# Patient Record
Sex: Female | Born: 1985 | Race: White | Hispanic: No | Marital: Married | Smoking: Never smoker
Health system: Southern US, Community
[De-identification: ages and names within clinical notes are randomized; demographics above are authoritative.]

## PROBLEM LIST (undated history)

## (undated) DIAGNOSIS — N1330 Unspecified hydronephrosis: Secondary | ICD-10-CM

## (undated) DIAGNOSIS — O2240 Hemorrhoids in pregnancy, unspecified trimester: Secondary | ICD-10-CM

## (undated) DIAGNOSIS — J309 Allergic rhinitis, unspecified: Secondary | ICD-10-CM

## (undated) DIAGNOSIS — T753XXA Motion sickness, initial encounter: Secondary | ICD-10-CM

## (undated) DIAGNOSIS — Z87442 Personal history of urinary calculi: Secondary | ICD-10-CM

## (undated) DIAGNOSIS — N390 Urinary tract infection, site not specified: Secondary | ICD-10-CM

## (undated) HISTORY — DX: Hemorrhoids in pregnancy, unspecified trimester: O22.40

## (undated) HISTORY — DX: Urinary tract infection, site not specified: N39.0

## (undated) HISTORY — DX: Unspecified hydronephrosis: N13.30

## (undated) HISTORY — DX: Personal history of urinary calculi: Z87.442

## (undated) HISTORY — DX: Allergic rhinitis, unspecified: J30.9

## (undated) HISTORY — DX: Motion sickness, initial encounter: T75.3XXA

---

## 2005-10-04 ENCOUNTER — Emergency Department: Payer: Self-pay | Admitting: Emergency Medicine

## 2008-12-22 ENCOUNTER — Ambulatory Visit: Payer: Self-pay | Admitting: Internal Medicine

## 2009-07-26 ENCOUNTER — Other Ambulatory Visit: Payer: Self-pay | Admitting: Internal Medicine

## 2010-05-09 ENCOUNTER — Other Ambulatory Visit: Payer: Self-pay | Admitting: Internal Medicine

## 2010-11-20 ENCOUNTER — Emergency Department: Payer: Self-pay | Admitting: Emergency Medicine

## 2010-11-30 ENCOUNTER — Encounter: Payer: Self-pay | Admitting: Maternal & Fetal Medicine

## 2011-03-05 ENCOUNTER — Encounter: Payer: Self-pay | Admitting: Obstetrics and Gynecology

## 2011-03-16 ENCOUNTER — Ambulatory Visit: Payer: Self-pay | Admitting: Obstetrics and Gynecology

## 2011-03-30 ENCOUNTER — Other Ambulatory Visit: Payer: Self-pay | Admitting: Obstetrics and Gynecology

## 2011-04-06 ENCOUNTER — Other Ambulatory Visit: Payer: Self-pay | Admitting: Family Medicine

## 2011-04-20 ENCOUNTER — Ambulatory Visit: Payer: Self-pay | Admitting: Family Medicine

## 2011-06-18 ENCOUNTER — Inpatient Hospital Stay: Payer: Self-pay | Admitting: Obstetrics and Gynecology

## 2011-11-16 ENCOUNTER — Encounter: Payer: Self-pay | Admitting: Internal Medicine

## 2011-11-16 ENCOUNTER — Ambulatory Visit (INDEPENDENT_AMBULATORY_CARE_PROVIDER_SITE_OTHER): Payer: PRIVATE HEALTH INSURANCE | Admitting: Internal Medicine

## 2011-11-16 VITALS — BP 92/60 | HR 76 | Temp 98.6°F | Resp 16 | Ht 62.0 in | Wt 116.8 lb

## 2011-11-16 DIAGNOSIS — N390 Urinary tract infection, site not specified: Secondary | ICD-10-CM

## 2011-11-16 DIAGNOSIS — Z30018 Encounter for initial prescription of other contraceptives: Secondary | ICD-10-CM

## 2011-11-16 DIAGNOSIS — R634 Abnormal weight loss: Secondary | ICD-10-CM

## 2011-11-16 DIAGNOSIS — Z124 Encounter for screening for malignant neoplasm of cervix: Secondary | ICD-10-CM

## 2011-11-16 DIAGNOSIS — Z87442 Personal history of urinary calculi: Secondary | ICD-10-CM | POA: Insufficient documentation

## 2011-11-16 DIAGNOSIS — Z3002 Counseling and instruction in natural family planning to avoid pregnancy: Secondary | ICD-10-CM

## 2011-11-16 LAB — POCT URINALYSIS DIPSTICK
Blood, UA: NEGATIVE
Glucose, UA: NEGATIVE
Nitrite, UA: NEGATIVE
Protein, UA: NEGATIVE
Urobilinogen, UA: 0.2

## 2011-11-16 MED ORDER — NORETHINDRONE 0.35 MG PO TABS: 1.0000 | ORAL_TABLET | Freq: Every day | ORAL | Status: DC

## 2011-11-18 ENCOUNTER — Encounter: Payer: Self-pay | Admitting: Internal Medicine

## 2011-11-18 DIAGNOSIS — Z124 Encounter for screening for malignant neoplasm of cervix: Secondary | ICD-10-CM | POA: Insufficient documentation

## 2011-11-18 LAB — CULTURE, URINE COMPREHENSIVE
Colony Count: NO GROWTH
Organism ID, Bacteria: NO GROWTH

## 2011-11-18 NOTE — Progress Notes (Signed)
  Subjective:    Patient ID: Brandi Johnson, female    DOB: 10/12/1986, 25 y.o.   MRN: [id number]  HPI  25 yo white female with no significant PMH returns for checkup after delivering a healthy female child approximately 5ymonths ago by C section.  She has actually lost weight and compare to her prenatal weight.  She is using a breast pump to nurse.   And has been back to work for a few weeks,  Often skipping lunch in order to pump. Sleep has improved now that her son is sleeping through the night.   Past Medical History  Diagnosis Date  . History of renal calculi     with hydroneprosis noted during pregnancy   No current outpatient prescriptions on file prior to visit.    Review of Systems  Constitutional: Negative for fever, chills and unexpected weight change.  HENT: Negative for hearing loss, ear pain, nosebleeds, congestion, sore throat, facial swelling, rhinorrhea, sneezing, mouth sores, trouble swallowing, neck pain, neck stiffness, voice change, postnasal drip, sinus pressure, tinnitus and ear discharge.   Eyes: Negative for pain, discharge, redness and visual disturbance.  Respiratory: Negative for cough, chest tightness, shortness of breath, wheezing and stridor.   Cardiovascular: Negative for chest pain, palpitations and leg swelling.  Musculoskeletal: Negative for myalgias and arthralgias.  Skin: Negative for color change and rash.  Neurological: Negative for dizziness, weakness, light-headedness and headaches.  Hematological: Negative for adenopathy.       Objective:   Physical Exam  Constitutional: She is oriented to person, place, and time. She appears well-developed and well-nourished.  HENT:  Mouth/Throat: Oropharynx is clear and moist.  Eyes: EOM are normal. Pupils are equal, round, and reactive to light. No scleral icterus.  Neck: Normal range of motion. Neck supple. No JVD present. No thyromegaly present.  Cardiovascular: Normal rate, regular rhythm, normal heart  sounds and intact distal pulses.   Pulmonary/Chest: Effort normal and breath sounds normal.  Abdominal: Soft. Bowel sounds are normal. She exhibits no mass. There is no tenderness.  Musculoskeletal: Normal range of motion. She exhibits no edema.  Lymphadenopathy:    She has no cervical adenopathy.  Neurological: She is alert and oriented to person, place, and time.  Skin: Skin is warm and dry.  Psychiatric: She has a normal mood and affect.      Assessment & Plan:  Weight loss:  [place] secondary to recent alteration in shcedule and skipping of meals. Will check thyroid function and protein stores to rule out hyperthyroidism.  history of renal calculi and hydronephrosis during pregnancy.  She had renal and urology followup and hydronephrosis reportedly resolved.  Will review [facility] records when available.

## 2011-11-18 NOTE — Assessment & Plan Note (Signed)
Normal Nov 2011.  Repeat in 2013.

## 2011-11-20 ENCOUNTER — Other Ambulatory Visit: Payer: Self-pay | Admitting: Internal Medicine

## 2011-12-26 ENCOUNTER — Encounter: Payer: Self-pay | Admitting: Internal Medicine

## 2012-04-21 ENCOUNTER — Other Ambulatory Visit: Payer: Self-pay | Admitting: Internal Medicine

## 2012-04-21 MED ORDER — NORGESTIMATE-ETH ESTRADIOL 0.25-35 MG-MCG PO TABS: 1.0000 | ORAL_TABLET | Freq: Every day | ORAL | Status: DC

## 2012-04-21 NOTE — Telephone Encounter (Signed)
Yes ok to fill Sprintec

## 2012-04-21 NOTE — Telephone Encounter (Signed)
Wants to take Sprintec instead of the mini pill.

## 2012-04-21 NOTE — Telephone Encounter (Signed)
Patient called and wanted to switch birth control from the mini pill to Sprintec.  She requested a 90 day supply.

## 2012-10-31 ENCOUNTER — Encounter: Payer: Self-pay | Admitting: Internal Medicine

## 2012-10-31 ENCOUNTER — Ambulatory Visit (INDEPENDENT_AMBULATORY_CARE_PROVIDER_SITE_OTHER): Payer: PRIVATE HEALTH INSURANCE | Admitting: Internal Medicine

## 2012-10-31 VITALS — BP 98/62 | HR 73 | Temp 98.2°F | Ht 62.5 in | Wt 123.0 lb

## 2012-10-31 DIAGNOSIS — B373 Candidiasis of vulva and vagina: Secondary | ICD-10-CM

## 2012-10-31 DIAGNOSIS — J3489 Other specified disorders of nose and nasal sinuses: Secondary | ICD-10-CM

## 2012-10-31 DIAGNOSIS — R0981 Nasal congestion: Secondary | ICD-10-CM

## 2012-10-31 DIAGNOSIS — B3731 Acute candidiasis of vulva and vagina: Secondary | ICD-10-CM

## 2012-10-31 MED ORDER — FLUCONAZOLE 150 MG PO TABS: 150.0000 mg | ORAL_TABLET | Freq: Every day | ORAL | Status: DC

## 2012-10-31 NOTE — Patient Instructions (Addendum)
Let's try dealing with inflammation instead of infection.Brandi Johnson PE 10 mg every 6 hours until 2 PM.. You may use Afrin if congested at bedtime only.  Simply saline twice daily for one week  Continue dymista regularly for one week   If no improvement in one week   Call  For CT scan of sinuses

## 2012-10-31 NOTE — Progress Notes (Signed)
Patient ID: Brandi Johnson, female   DOB: 11-21-1986, 26 y.o.   MRN: [id number]    Patient Active Problem List  Diagnosis  . History of renal calculi  . Screening for cervical cancer  . Vaginitis due to Candida  . Sinus congestion    Subjective:  CC:   Chief Complaint  Patient presents with  . Sinus Problem    HPI:   Brandi Johnson a 26 y.o. female who presents with persistent sinus pressure and congestion after receiving flunasal mist .  Treated with amoxicillin for one week, which she finished taking last Friday. for the past week having sinus pain without drainage, pressure behind her eyes and lights bothering her.  No fevers.  Had a candida infection and was treated with diflucan.    Past Medical History  Diagnosis Date  . History of renal calculi     with hydroneprosis noted during pregnancy  . Hemorrhoids in pregnancy     Past Surgical History  Procedure Date  . Cesarean section 2012         The following portions of the patient's history were reviewed and updated as appropriate: Allergies, current medications, and problem list.    Review of Systems:   12 Pt  review of systems was negative except those addressed in the HPI,     History   Social History  . Marital Status: Married    Spouse Name: N/A    Number of Children: N/A  . Years of Education: N/A   Occupational History  . Not on file.   Social History Main Topics  . Smoking status: Never Smoker   . Smokeless tobacco: Never Used  . Alcohol Use: Yes  . Drug Use: No  . Sexually Active: Not on file   Other Topics Concern  . Not on file   Social History Narrative  . No narrative on file    Objective:  BP 98/62  Pulse 73  Temp 98.2 F (36.8 C) (Oral)  Ht 5' 2.5" (1.588 m)  Wt 123 lb (55.792 kg)  BMI 22.14 kg/m2  SpO2 98%  LMP 09/30/2012  General appearance: alert, cooperative and appears stated age Ears: normal TM's and external ear canals both ears Throat: lips,  mucosa, and tongue normal; teeth and gums normal Neck: no adenopathy, no carotid bruit, supple, symmetrical, trachea midline and thyroid not enlarged, symmetric, no tenderness/mass/nodules Back: symmetric, no curvature. ROM normal. No CVA tenderness. Lungs: clear to auscultation bilaterally Heart: regular rate and rhythm, S1, S2 normal, no murmur, click, rub or gallop Abdomen: soft, non-tender; bowel sounds normal; no masses,  no organomegaly Pulses: 2+ and symmetric Skin: Skin color, texture, turgor normal. No rashes or lesions Lymph nodes: Cervical, supraclavicular, and axillary nodes normal.  Assessment and Plan:  Sinus congestion She has no signs of sinusitis on exam today . I suggested that we use anti-inflammatories and nasal decongestants for another week or 2 and if symptoms do not resolve we'll consider CT scan of her sinuses.   Updated Medication List Outpatient Encounter Prescriptions as of 10/31/2012  Medication Sig Dispense Refill  . norgestimate-ethinyl estradiol (ORTHO-CYCLEN,SPRINTEC,PREVIFEM) 0.25-35 MG-MCG tablet Take 1 tablet by mouth daily.  3 Package  3  . fluconazole (DIFLUCAN) 150 MG tablet Take 1 tablet (150 mg total) by mouth daily.  2 tablet  0     No orders of the defined types were placed in this encounter.    No Follow-up on file.

## 2012-11-02 ENCOUNTER — Encounter: Payer: Self-pay | Admitting: Internal Medicine

## 2012-11-02 DIAGNOSIS — R0981 Nasal congestion: Secondary | ICD-10-CM | POA: Insufficient documentation

## 2012-11-02 NOTE — Assessment & Plan Note (Signed)
She has no signs of sinusitis on exam today . I suggested that we use anti-inflammatories and nasal decongestants for another week or 2 and if symptoms do not resolve we'll consider CT scan of her sinuses.

## 2012-11-04 ENCOUNTER — Telehealth: Payer: Self-pay | Admitting: Internal Medicine

## 2012-11-04 MED ORDER — PROMETHAZINE HCL 25 MG PO TABS
25.0000 mg | ORAL_TABLET | Freq: Three times a day (TID) | ORAL | Status: DC | PRN
Start: 2012-11-04 — End: 2012-11-04

## 2012-11-04 MED ORDER — PROMETHAZINE HCL 25 MG PO TABS: 25.0000 mg | ORAL_TABLET | Freq: Three times a day (TID) | ORAL | Status: DC | PRN

## 2012-11-04 NOTE — Telephone Encounter (Signed)
2nd rx sent , husband and wife both vomiting?

## 2012-11-04 NOTE — Telephone Encounter (Signed)
Caller: [name]/Patient; Patient Name: Brandi Johnson; PCP: Duncan Dull (Adults only); Best Callback Phone Number: [phone]; Reason for call: Patient calling about vomiting and diarrhea.  Onset 11/03/12.  States has had ongoing nausea as well.  Per vomiting protocol, emergent symptoms denied; home care advised, with callback parameters given.  Patient wants Rx for medication to reduce nausea.  No standing orders for this on daytime orders.  Info to office for provider review/Rx/callback.   Uses [facility] May reach patient at [phone].

## 2012-11-04 NOTE — Telephone Encounter (Signed)
Patient advised via telephone

## 2012-12-26 ENCOUNTER — Telehealth: Payer: Self-pay | Admitting: Internal Medicine

## 2012-12-26 DIAGNOSIS — J321 Chronic frontal sinusitis: Secondary | ICD-10-CM

## 2012-12-26 NOTE — Telephone Encounter (Signed)
Pt called stating she wanted to go ahead with the ct of her sinus

## 2013-01-02 ENCOUNTER — Telehealth: Payer: Self-pay | Admitting: Internal Medicine

## 2013-01-02 NOTE — Telephone Encounter (Signed)
Please patient that insurance would not cover a CT of her sinuses unless she saw ENT first. Would she like me to refer?

## 2013-01-02 NOTE — Telephone Encounter (Signed)
Message copied by Sherlene Shams on Fri Jan 02, 2013  5:57 PM ------      Message from: Jerene Bears R      Created: Thu Jan 01, 2013  9:09 AM       Elvina Sidle,      I called the insurance with the info you gave me Tuesday, the Insurance said we should try a different route, maybe ENT before they can authorize the CT.                         -AB

## 2013-01-05 NOTE — Telephone Encounter (Signed)
Patient informed of insurance denial and does not seek referral services.

## 2013-03-13 ENCOUNTER — Other Ambulatory Visit: Payer: Self-pay | Admitting: General Practice

## 2013-03-13 MED ORDER — NORGESTIMATE-ETH ESTRADIOL 0.25-35 MG-MCG PO TABS: 1.0000 | ORAL_TABLET | Freq: Every day | ORAL | Status: DC

## 2013-05-22 ENCOUNTER — Ambulatory Visit (INDEPENDENT_AMBULATORY_CARE_PROVIDER_SITE_OTHER): Payer: 59 | Admitting: Internal Medicine

## 2013-05-22 ENCOUNTER — Other Ambulatory Visit (HOSPITAL_COMMUNITY)
Admission: RE | Admit: 2013-05-22 | Discharge: 2013-05-22 | Disposition: A | Discharge status: Home / Self Care | Payer: 59 | Source: Ambulatory Visit | Attending: Internal Medicine | Admitting: Internal Medicine

## 2013-05-22 ENCOUNTER — Encounter: Payer: Self-pay | Admitting: Internal Medicine

## 2013-05-22 VITALS — BP 102/68 | HR 71 | Temp 97.9°F | Resp 16 | Ht 61.0 in | Wt 126.8 lb

## 2013-05-22 DIAGNOSIS — Z01419 Encounter for gynecological examination (general) (routine) without abnormal findings: Secondary | ICD-10-CM | POA: Insufficient documentation

## 2013-05-22 DIAGNOSIS — N912 Amenorrhea, unspecified: Secondary | ICD-10-CM

## 2013-05-22 DIAGNOSIS — Z1151 Encounter for screening for human papillomavirus (HPV): Secondary | ICD-10-CM | POA: Insufficient documentation

## 2013-05-22 DIAGNOSIS — Z124 Encounter for screening for malignant neoplasm of cervix: Secondary | ICD-10-CM

## 2013-05-22 DIAGNOSIS — D62 Acute posthemorrhagic anemia: Secondary | ICD-10-CM

## 2013-05-22 DIAGNOSIS — Z0000 Encounter for general adult medical examination without abnormal findings: Secondary | ICD-10-CM

## 2013-05-22 DIAGNOSIS — R5381 Other malaise: Secondary | ICD-10-CM

## 2013-05-22 DIAGNOSIS — Z8349 Family history of other endocrine, nutritional and metabolic diseases: Secondary | ICD-10-CM

## 2013-05-22 DIAGNOSIS — R5383 Other fatigue: Secondary | ICD-10-CM

## 2013-05-22 LAB — IRON AND TIBC
TIBC: 422 ug/dL (ref 250–470)
UIBC: 342 ug/dL (ref 125–400)

## 2013-05-22 LAB — FERRITIN: Ferritin: 34 ng/mL (ref 10–291)

## 2013-05-22 LAB — COMPLETE METABOLIC PANEL WITH GFR
ALT: 17 U/L (ref 0–35)
AST: 18 U/L (ref 0–37)
Albumin: 4.4 g/dL (ref 3.5–5.2)
Calcium: 9.4 mg/dL (ref 8.4–10.5)
Chloride: 102 mEq/L (ref 96–112)
Potassium: 3.8 mEq/L (ref 3.5–5.3)
Sodium: 137 mEq/L (ref 135–145)

## 2013-05-22 LAB — CBC WITH DIFFERENTIAL/PLATELET
Basophils Absolute: 0 10*3/uL (ref 0.0–0.1)
HCT: 38 % (ref 36.0–46.0)
Lymphocytes Relative: 36 % (ref 12–46)
Neutro Abs: 5 10*3/uL (ref 1.7–7.7)
Platelets: 270 10*3/uL (ref 150–400)
RDW: 14.3 % (ref 11.5–15.5)
WBC: 9.1 10*3/uL (ref 4.0–10.5)

## 2013-05-22 LAB — POCT URINE PREGNANCY: Preg Test, Ur: NEGATIVE

## 2013-05-22 NOTE — Patient Instructions (Addendum)
We are checking your iron stores today since your mother has iron overload and it is hereditary.  If your PAP smear shows yeast,  We will call in a medicine for it.

## 2013-05-24 NOTE — Progress Notes (Signed)
Patient ID: Brandi Johnson, female   DOB: 1986-05-24, 27 y.o.   MRN: [id number]  Subjective:     Brandi Johnson is a 27 y.o. female and is here for a comprehensive physical exam. The patient reports no problems.  History   Social History  . Marital Status: Married    Spouse Name: N/A    Number of Children: N/A  . Years of Education: N/A   Occupational History  . Not on file.   Social History Main Topics  . Smoking status: Never Smoker   . Smokeless tobacco: Never Used  . Alcohol Use: Yes  . Drug Use: No  . Sexually Active: Not on file   Other Topics Concern  . Not on file   Social History Narrative  . No narrative on file   Health Maintenance  Topic Date Due  . Tetanus/tdap  09/17/2004  . Influenza Vaccine  08/31/2013  . Pap Smear  05/22/2016    The following portions of the patient's history were reviewed and updated as appropriate: allergies, current medications, past family history, past medical history, past social history, past surgical history and problem list.  Review of Systems A comprehensive review of systems was negative.   Objective:   General Appearance:    Alert, cooperative, no distress, appears stated age  Head:    Normocephalic, without obvious abnormality, atraumatic  Eyes:    PERRL, conjunctiva/corneas clear, EOM's intact, fundi    benign, both eyes  Ears:    Normal TM's and external ear canals, both ears  Nose:   Nares normal, septum midline, mucosa normal, no drainage    or sinus tenderness  Throat:   Lips, mucosa, and tongue normal; teeth and gums normal  Neck:   Supple, symmetrical, trachea midline, no adenopathy;    thyroid:  no enlargement/tenderness/nodules; no carotid   bruit or JVD  Back:     Symmetric, no curvature, ROM normal, no CVA tenderness  Lungs:     Clear to auscultation bilaterally, respirations unlabored  Chest Wall:    No tenderness or deformity   Heart:    Regular rate and rhythm, S1 and S2 normal, no murmur, rub   or  gallop  Breast Exam:    No tenderness, masses, or nipple abnormality  Abdomen:     Soft, non-tender, bowel sounds active all four quadrants,    no masses, no organomegaly  Genitalia:    Pelvic: cervix normal in appearance, external genitalia normal, no adnexal masses or tenderness, no cervical motion tenderness, rectovaginal septum normal, uterus normal size, shape, and consistency and vagina normal with increased nonodorous white discharge  Extremities:   Extremities normal, atraumatic, no cyanosis or edema  Pulses:   2+ and symmetric all extremities  Skin:   Skin color, texture, turgor normal, no rashes or lesions  Lymph nodes:   Cervical, supraclavicular, and axillary nodes normal  Neurologic:   CNII-XII intact, normal strength, sensation and reflexes    throughout    Assessment:   Family history of hemochromatosis CBC and iron stores were normal  Routine general medical examination at a health care facility Annual comprehensive exam was done including breast, pelvic and PAP smear. All screenings have been addressed .     Updated Medication List Outpatient Encounter Prescriptions as of 05/22/2013  Medication Sig Dispense Refill  . Azelastine-Fluticasone 137-50 MCG/ACT SUSP Place 1 spray into the nose daily.      . montelukast (SINGULAIR) 10 MG tablet Take 10 mg by mouth at bedtime.      Marland Kitchen  norgestimate-ethinyl estradiol (ORTHO-CYCLEN,SPRINTEC,PREVIFEM) 0.25-35 MG-MCG tablet Take 1 tablet by mouth daily.  3 Package  0  . promethazine (PHENERGAN) 25 MG tablet Take 1 tablet (25 mg total) by mouth every 8 (eight) hours as needed for nausea.  20 tablet  0  . fluconazole (DIFLUCAN) 150 MG tablet Take 1 tablet (150 mg total) by mouth daily.  2 tablet  0   No facility-administered encounter medications on file as of 05/22/2013.

## 2013-05-25 DIAGNOSIS — Z0000 Encounter for general adult medical examination without abnormal findings: Secondary | ICD-10-CM | POA: Insufficient documentation

## 2013-05-25 DIAGNOSIS — Z8349 Family history of other endocrine, nutritional and metabolic diseases: Secondary | ICD-10-CM | POA: Insufficient documentation

## 2013-05-25 NOTE — Assessment & Plan Note (Signed)
Annual comprehensive exam was done including breast, pelvic and PAP smear. All screenings have been addressed .  

## 2013-05-25 NOTE — Assessment & Plan Note (Signed)
CBC and iron stores were normal

## 2013-08-20 ENCOUNTER — Encounter: Payer: Self-pay | Admitting: Maternal & Fetal Medicine

## 2013-10-16 ENCOUNTER — Telehealth: Payer: Self-pay | Admitting: Internal Medicine

## 2013-10-16 MED ORDER — MONTELUKAST SODIUM 10 MG PO TABS: 10.0000 mg | ORAL_TABLET | Freq: Every day | ORAL | Status: DC

## 2013-10-16 NOTE — Telephone Encounter (Signed)
montelukast (SINGULAIR) 10 MG tablet

## 2013-12-09 ENCOUNTER — Other Ambulatory Visit: Payer: Self-pay | Admitting: Obstetrics and Gynecology

## 2014-02-26 ENCOUNTER — Ambulatory Visit: Payer: Self-pay | Admitting: Obstetrics and Gynecology

## 2014-02-26 LAB — CBC WITH DIFFERENTIAL/PLATELET
Basophil #: 0 10*3/uL (ref 0.0–0.1)
Basophil %: 0.3 %
EOS PCT: 0.6 %
Eosinophil #: 0 10*3/uL (ref 0.0–0.7)
HCT: 36.5 % (ref 35.0–47.0)
HGB: 12.3 g/dL (ref 12.0–16.0)
LYMPHS PCT: 27.9 %
Lymphocyte #: 1.7 10*3/uL (ref 1.0–3.6)
MCH: 27.9 pg (ref 26.0–34.0)
MCHC: 33.6 g/dL (ref 32.0–36.0)
MCV: 83 fL (ref 80–100)
Monocyte #: 0.4 x10 3/mm (ref 0.2–0.9)
Monocyte %: 6.3 %
NEUTROS PCT: 64.9 %
Neutrophil #: 3.9 10*3/uL (ref 1.4–6.5)
PLATELETS: 153 10*3/uL (ref 150–440)
RBC: 4.4 10*6/uL (ref 3.80–5.20)
RDW: 14.8 % — ABNORMAL HIGH (ref 11.5–14.5)
WBC: 6.1 10*3/uL (ref 3.6–11.0)

## 2014-03-01 ENCOUNTER — Inpatient Hospital Stay: Payer: Self-pay | Admitting: Obstetrics and Gynecology

## 2014-03-01 LAB — GC/CHLAMYDIA PROBE AMP

## 2014-03-02 LAB — HEMATOCRIT: HCT: 26.7 % — ABNORMAL LOW (ref 35.0–47.0)

## 2014-03-08 ENCOUNTER — Ambulatory Visit: Payer: Self-pay | Admitting: Pediatrics

## 2014-03-22 ENCOUNTER — Encounter: Payer: Self-pay | Admitting: Internal Medicine

## 2014-03-22 ENCOUNTER — Ambulatory Visit (INDEPENDENT_AMBULATORY_CARE_PROVIDER_SITE_OTHER): Payer: 59 | Admitting: Internal Medicine

## 2014-03-22 VITALS — BP 98/60 | HR 95 | Temp 100.2°F | Wt 129.0 lb

## 2014-03-22 DIAGNOSIS — J029 Acute pharyngitis, unspecified: Secondary | ICD-10-CM

## 2014-03-22 LAB — POCT RAPID STREP A (OFFICE): Rapid Strep A Screen: NEGATIVE

## 2014-03-22 LAB — POCT INFLUENZA A/B
INFLUENZA A, POC: NEGATIVE
INFLUENZA B, POC: NEGATIVE

## 2014-03-22 MED ORDER — AMOXICILLIN 500 MG PO CAPS: 1000.0000 mg | ORAL_CAPSULE | Freq: Every day | ORAL | Status: DC

## 2014-03-22 NOTE — Progress Notes (Signed)
   Subjective:    Patient ID: Brandi Johnson, female    DOB: 12-May-1986, 28 y.o.   MRN: [id number]  HPI 27YO female presents for acute visit.  Wednesday developed sore throat and congestion. Thursday night had fever 101.79F. Took Tylenol and Ibuprofen. Noted some myalgia in back and shoulders. Morning felt better. Persistent fever over weekend. Taking Claritin. No cough. Clear congestion. Sore throat and headache have persisted despite use of ibuprofen.  Gave birth to full term infant 3 weeks ago by c-section.  Review of Systems  Constitutional: Positive for fever. Negative for chills, appetite change, fatigue and unexpected weight change.  HENT: Positive for sore throat. Negative for congestion, ear pain, sinus pressure, trouble swallowing and voice change.   Eyes: Negative for visual disturbance.  Respiratory: Negative for cough, shortness of breath, wheezing and stridor.   Cardiovascular: Negative for chest pain, palpitations and leg swelling.  Gastrointestinal: Negative for nausea, vomiting, abdominal pain, diarrhea, constipation, blood in stool, abdominal distention and anal bleeding.  Genitourinary: Negative for dysuria and flank pain.  Musculoskeletal: Negative for arthralgias, gait problem, myalgias and neck pain.  Skin: Negative for color change and rash.  Neurological: Positive for headaches. Negative for dizziness.  Hematological: Negative for adenopathy. Does not bruise/bleed easily.  Psychiatric/Behavioral: Negative for suicidal ideas, sleep disturbance and dysphoric mood. The patient is not nervous/anxious.        Objective:    BP 98/60  Pulse 95  Temp(Src) 100.2 F (37.9 C) (Oral)  Wt 129 lb (58.514 kg)  SpO2 97% Physical Exam  Constitutional: She is oriented to person, place, and time. She appears well-developed and well-nourished. No distress.  HENT:  Head: Normocephalic and atraumatic.  Right Ear: External ear normal.  Left Ear: External ear normal.  Nose:  Nose normal.  Mouth/Throat: Oropharyngeal exudate and posterior oropharyngeal erythema present.  Eyes: Conjunctivae are normal. Pupils are equal, round, and reactive to light. Right eye exhibits no discharge. Left eye exhibits no discharge. No scleral icterus.  Neck: Normal range of motion. Neck supple. No tracheal deviation present. No thyromegaly present.  Cardiovascular: Normal rate, regular rhythm, normal heart sounds and intact distal pulses.  Exam reveals no gallop and no friction rub.   No murmur heard. Pulmonary/Chest: Effort normal and breath sounds normal. No accessory muscle usage. Not tachypneic. No respiratory distress. She has no decreased breath sounds. She has no wheezes. She has no rhonchi. She has no rales. She exhibits no tenderness.  Musculoskeletal: Normal range of motion. She exhibits no edema and no tenderness.  Lymphadenopathy:    She has no cervical adenopathy.  Neurological: She is alert and oriented to person, place, and time. No cranial nerve deficit. She exhibits normal muscle tone. Coordination normal.  Skin: Skin is warm and dry. No rash noted. She is not diaphoretic. No erythema. No pallor.  Psychiatric: She has a normal mood and affect. Her behavior is normal. Judgment and thought content normal.          Assessment & Plan:   Problem List Items Addressed This Visit   None       No Follow-up on file.

## 2014-03-22 NOTE — Patient Instructions (Signed)
Start Amoxicillin as directed for strep throat.  Continue Ibuprofen and Tylenol as needed for pain or fever.  Follow up if symptoms are not improving.

## 2014-03-22 NOTE — Assessment & Plan Note (Signed)
Symptoms and exam are most consistent with strep pharyngitis. Clinical criteria including sore throat with erythema and exudate on exam, headache, fever, and absence of typical viral URI symptoms. Will treat with Amoxicillin 1gm daily (discussed options for dosing, but given hectic schedule caring for newborn, preferred ease of once daily dosing. Reviewed amoxicillin is safe in breastfeeding. Follow up if symptoms are not improving or if any recurrent fever after 24 hr antibiotics.

## 2014-03-24 ENCOUNTER — Encounter: Payer: Self-pay | Admitting: Internal Medicine

## 2014-03-25 ENCOUNTER — Encounter: Payer: Self-pay | Admitting: Internal Medicine

## 2014-03-25 ENCOUNTER — Ambulatory Visit (INDEPENDENT_AMBULATORY_CARE_PROVIDER_SITE_OTHER): Payer: 59 | Admitting: Internal Medicine

## 2014-03-25 VITALS — BP 100/64 | HR 79 | Temp 98.4°F | Resp 18 | Wt 129.8 lb

## 2014-03-25 DIAGNOSIS — J029 Acute pharyngitis, unspecified: Secondary | ICD-10-CM

## 2014-03-25 MED ORDER — METHYLPREDNISOLONE 4 MG PO KIT: PACK | ORAL | Status: DC

## 2014-03-25 MED ORDER — MONTELUKAST SODIUM 10 MG PO TABS: 10.0000 mg | ORAL_TABLET | Freq: Every day | ORAL | Status: DC

## 2014-03-25 NOTE — Patient Instructions (Signed)
Your throat looks much better.  Since the fevers are finally resolving,  Continue the amoxicillin and we will add a medrol dose pack for inflammation

## 2014-03-25 NOTE — Telephone Encounter (Signed)
Please advise tried to call patient and schedule appointment left message to call office. FYI

## 2014-03-28 NOTE — Assessment & Plan Note (Addendum)
Clinically resolving .  continue antibiotics and probiotics. Adding a prednisone  taper to manage other symptoms.

## 2014-03-28 NOTE — Progress Notes (Signed)
Patient ID: Brandi Johnson, female   DOB: December 13, 1986, 28 y.o.   MRN: [id number]  Patient Active Problem List   Diagnosis Date Noted  . Acute pharyngitis 03/22/2014  . Family history of hemochromatosis 05/25/2013  . Routine general medical examination at a health care facility 05/25/2013  . Screening for cervical cancer 11/18/2011  . History of renal calculi     Subjective:  CC:   Chief Complaint  Patient presents with  . Acute Visit    X 1 week    HPI:   Brandi Johnson is a 28 y.o. female who presents for Follow up on recent episode of acute pharyngitis.  Started abx less than 48 hours ago for fevers..  Sore throat with exudate.  Fevers have been recurrent until  this morning.  Symptoms have resolved.    Past Medical History  Diagnosis Date  . History of renal calculi     with hydroneprosis noted during pregnancy  . Hemorrhoids in pregnancy     Past Surgical History  Procedure Laterality Date  . Cesarean section  2012       The following portions of the patient's history were reviewed and updated as appropriate: Allergies, current medications, and problem list.    Review of Systems:   Patient denies headache, fevers, malaise, unintentional weight loss, skin rash, eye pain, sinus congestion and sinus pain, sore throat, dysphagia,  hemoptysis , cough, dyspnea, wheezing, chest pain, palpitations, orthopnea, edema, abdominal pain, nausea, melena, diarrhea, constipation, flank pain, dysuria, hematuria, urinary  Frequency, nocturia, numbness, tingling, seizures,  Focal weakness, Loss of consciousness,  Tremor, insomnia, depression, anxiety, and suicidal ideation.     History   Social History  . Marital Status: Married    Spouse Name: N/A    Number of Children: N/A  . Years of Education: N/A   Occupational History  . Not on file.   Social History Main Topics  . Smoking status: Never Smoker   . Smokeless tobacco: Never Used  . Alcohol Use: Yes  . Drug Use: No   . Sexual Activity: Not on file   Other Topics Concern  . Not on file   Social History Narrative  . No narrative on file    Objective:  Filed Vitals:   03/25/14 1612  BP: 100/64  Pulse: 79  Temp: 98.4 F (36.9 C)  Resp: 18     General appearance: alert, cooperative and appears stated age Ears: normal TM's and external ear canals both ears Throat: lips, mucosa, and tongue normal; teeth and gums normal Neck: no adenopathy, no carotid bruit, supple, symmetrical, trachea midline and thyroid not enlarged, symmetric, no tenderness/mass/nodules Back: symmetric, no curvature. ROM normal. No CVA tenderness. Lungs: clear to auscultation bilaterally Heart: regular rate and rhythm, S1, S2 normal, no murmur, click, rub or gallop Abdomen: soft, non-tender; bowel sounds normal; no masses,  no organomegaly Pulses: 2+ and symmetric Skin: Skin color, texture, turgor normal. No rashes or lesions Lymph nodes: Cervical, supraclavicular, and axillary nodes normal.  Assessment and Plan:  Acute pharyngitis Clinically resolving .  continue antibiotics and probiotics. Adding a prednisone  taper to manage other symptoms.    Updated Medication List Outpatient Encounter Prescriptions as of 03/25/2014  Medication Sig  . amoxicillin (AMOXIL) 500 MG capsule Take 2 capsules (1,000 mg total) by mouth daily.  . Azelastine-Fluticasone 137-50 MCG/ACT SUSP Place 1 spray into the nose daily.  . IRON PO Take 65 mg by mouth daily.  Marland Kitchen. loratadine (CLARITIN) 10 MG tablet  Take 10 mg by mouth daily.  . Prenatal Vit-Fe Fumarate-FA (M-VIT PO) Take by mouth.  . promethazine (PHENERGAN) 25 MG tablet Take 1 tablet (25 mg total) by mouth every 8 (eight) hours as needed for nausea.  . methylPREDNISolone (MEDROL DOSEPAK) 4 MG tablet follow package directions  . montelukast (SINGULAIR) 10 MG tablet Take 1 tablet (10 mg total) by mouth at bedtime.  . norgestimate-ethinyl estradiol (ORTHO-CYCLEN,SPRINTEC,PREVIFEM) 0.25-35  MG-MCG tablet Take 1 tablet by mouth daily.  . [DISCONTINUED] montelukast (SINGULAIR) 10 MG tablet Take 1 tablet (10 mg total) by mouth at bedtime.     No orders of the defined types were placed in this encounter.    No Follow-up on file.

## 2014-10-26 ENCOUNTER — Ambulatory Visit: Payer: Self-pay | Admitting: Obstetrics and Gynecology

## 2014-10-26 LAB — GLUCOSE, RANDOM: Glucose: 80 mg/dL (ref 65–99)

## 2014-10-26 LAB — LIPID PANEL
CHOLESTEROL: 158 mg/dL (ref 0–200)
HDL Cholesterol: 47 mg/dL (ref 40–60)
LDL CHOLESTEROL, CALC: 96 mg/dL (ref 0–100)
Triglycerides: 73 mg/dL (ref 0–200)
VLDL CHOLESTEROL, CALC: 15 mg/dL (ref 5–40)

## 2014-10-26 LAB — TSH: THYROID STIMULATING HORM: 1.72 u[IU]/mL

## 2015-04-01 ENCOUNTER — Other Ambulatory Visit: Payer: Self-pay | Admitting: Internal Medicine

## 2015-04-23 NOTE — Op Note (Signed)
PATIENT NAME:  Brandi Johnson, Brandi Johnson MR#:  [id number] DATE OF BIRTH:  [birth year]  DATE OF PROCEDURE:  03/01/2014  PREOPERATIVE DIAGNOSES:  1. A 39.6 week intrauterine pregnancy, undelivered.  2. History of previous cesarean section delivery.  3. Rubella nonimmune.  4. Group B Streptococcus positive/group B Streptococcus bacteriuria.   POSTOPERATIVE DIAGNOSES:  1. A 39.6 week intrauterine pregnancy, delivered.  2. Viable female infant, 7 pounds 4 ounces.  3. History of previous cesarean section delivery.  4. Rubella nonimmune.  5. Group B Streptococcus positive/group B Streptococcus bacteriuria.  6. Pelvic adhesive disease.   OPERATIVE PROCEDURE: Repeat cesarean section delivery.   SURGEON: Prentice DockerMartin A. DeFrancesco, M.D.   FIRST ASSISTANT: Dr. Valentino Saxonherry.   SECOND ASSISTANT: Herby AbrahamAlan Benda, PA-S.   ANESTHESIA: Spinal.   INDICATIONS: The patient is a 29 year old, married, white female gravida 2, para 1-0-0-1, at 39.6 weeks' gestation with an EDC of 03/02/2014, presents for repeat cesarean section delivery. Prenatal course has been notable for rubella nonimmune status, history of GBS positive urine and GBS positive colonization of the vagina. The patient desires repeat cesarean section delivery.   FINDINGS AT SURGERY: Revealed a viable female infant, 7 pounds 4 ounces, having Apgars of 9 and 9 at 1 and 5 minutes, respectively. The tubes and ovaries were grossly normal. There were dense pelvic adhesions between the bladder flap, lower uterine segment and the anterior abdominal wall which had to be lysed. There was no evidence of bladder injury following inspection after C-section.   DESCRIPTION OF PROCEDURE: The patient was brought to the operating room and was placed in the sitting position. Spinal anesthetic was introduced without difficulty. She was placed in the supine position with a right lateral hip roll in place. A ChloraPrep abdominal prep and vaginal prep were completed in standard fashion. A  Foley catheter was placed and was draining clear yellow urine from the bladder. After checking for adequate level of anesthesia, the Pfannenstiel skin incision was made. Fascia was incised transversely and extended bilaterally with Mayo scissors. Peritoneum was entered after the rectus muscle was dissected off of the fascia. The extensive adhesions were encountered, and tedious dissection had to be completed in order to take down the lower uterine segment and right pelvic sidewall adhesions. Once completed, a low transverse incision was made in the uterus, and this was extended both cephalad and caudad in standard fashion. The infant was delivered from vertex presentation and was vigorous at birth. Oropharynx was bulb suctioned on the abdominal wall, and the infant was delivered in a standard manner. The umbilical cord was doubly clamped and cut, and the infant was handed off to the awaiting resuscitation team. Cord blood sampling was obtained. The placenta was expressed from the uterine cavity. The uterus was externalized onto the anterior abdominal wall and was cleared of all debris with laps. The incision was closed in 2 layers using #1 chromic suture. First layer was a running locking stitch. The second layer was an imbricating layer. There was extensive denuding of the uterine serosa from the adhesiolysis and, therefore, multiple mattress sutures and figure-of-eight sutures were placed to optimize hemostasis. Interceed was placed over the raw uterine surface to prevent adhesions. The uterus was placed back into the abdominopelvic cavity. Gutters were cleared of all debris with laps. The incision was closed in layers with 0 Maxon being used on the fascia in a simple running manner. The skin was closed with a 4-0 Vicryl running subcuticular stitch. Steri-Strips were applied. Pressure dressing was applied, and  the patient was then mobilized and taken to the recovery room in satisfactory condition.   ESTIMATED  BLOOD LOSS: 500 mL.   IV FLUIDS: Not quantified at the time of this dictation.   URINE OUTPUT: Not quantified.   COUNTS: All instrument, needle and sponge counts were verified as correct.   ____________________________ Prentice Docker. DeFrancesco, MD mad:gb D: 03/01/2014 13:58:21 ET T: 03/02/2014 08:12:37 ET JOB#: [id number]  cc: Daphine Deutscher A. DeFrancesco, MD, <Dictator> [facility] Prentice Docker DEFRANCESCO MD ELECTRONICALLY SIGNED 03/13/2014 11:46

## 2015-04-23 NOTE — Consult Note (Signed)
Details:   - Pt arrived at [id number] with baby Yvonna AlanisKaitlyn and husband. Baby is one week old left hospital using nipple shield and breast pump.  Pt stopped breastfeeding and has mostly pumped since then.  She wishes to try to get baby back to breast.  Weight up from 03-05-14 of 6.8 to 7.02 or 3260 today without clothes or diaper.  With diaper baby weight is 7.03 and after nursing with nipple shield on both breasts for 30 min, weight increased to 3310 with an intake of 50 cc.  Baby usually takes 2-3 oz per bottle.  Baby able to latch without shield on left breast but pulled off frequently.  Baby falls asleep easily at breast and needs stimulation to keep nursing.  Pt will continue to pump breasts a few times a day and give EBM and also breastfeed with nipple shield at some feedings.  She will supplement after breastfeeding with EBM if baby does not seem satisfied after nursing.  Pt pumps 2-6 oz when she pumps.  She has a Ped. appt on Wednesday and if wt continues to increase she may try to wean baby off nipple shield.  Instructions given for doing this.  Today's wt faxed to Dr. Stormy FabianBonney's office.   Electronic Signatures: Clair GullingBarbee, Marsha D (RNC-OB, IBCLC)  (Signed 09-Mar-15 17:34)  Authored: Details   Last Updated: 09-Mar-15 17:34 by Clair GullingBarbee, Marsha D (RNC-OB, IBCLC)

## 2015-10-22 LAB — HM PAP SMEAR: HM PAP: NEGATIVE

## 2015-10-25 ENCOUNTER — Ambulatory Visit (INDEPENDENT_AMBULATORY_CARE_PROVIDER_SITE_OTHER): Payer: 59 | Admitting: Obstetrics and Gynecology

## 2015-10-25 ENCOUNTER — Encounter: Payer: Self-pay | Admitting: Obstetrics and Gynecology

## 2015-10-25 VITALS — BP 105/77 | HR 98 | Ht 61.0 in | Wt 122.5 lb

## 2015-10-25 DIAGNOSIS — Z01419 Encounter for gynecological examination (general) (routine) without abnormal findings: Secondary | ICD-10-CM | POA: Diagnosis not present

## 2015-10-25 DIAGNOSIS — F909 Attention-deficit hyperactivity disorder, unspecified type: Secondary | ICD-10-CM

## 2015-10-25 DIAGNOSIS — F988 Other specified behavioral and emotional disorders with onset usually occurring in childhood and adolescence: Secondary | ICD-10-CM | POA: Insufficient documentation

## 2015-10-25 MED ORDER — MONTELUKAST SODIUM 10 MG PO TABS: 10.0000 mg | ORAL_TABLET | Freq: Every day | ORAL | Status: DC

## 2015-10-25 NOTE — Progress Notes (Signed)
Patient ID: Brandi Johnson, female   DOB: Nov 17, 1986, 29 y.o.   MRN: [id number] ANNUAL PREVENTATIVE CARE GYN  ENCOUNTER NOTE  Subjective:       Brandi Johnson is a 29 y.o. G83P2002 female here for a routine annual gynecologic exam.  Current complaints: 1.  Having trouble focusing 2.  Amenorrhea with Mirena IUD        Gynecologic History No LMP recorded. Patient is not currently having periods (Reason: IUD). Contraception: IUD Last Pap: pap reflx- neg. Results were: normal Last mammogram: n/a. Results were: n/a  Obstetric History OB History  Gravida Para Term Preterm AB SAB TAB Ectopic Multiple Living  # Outcome Date GA Lbr Len/2nd Weight Sex Delivery Anes PTL Lv  2 Term 2015   7 lb 6.4 oz (3.357 kg) F CS-LTranv   Y  1 Term 2012   8 lb 1.6 oz (3.674 kg) M CS-LTranv         Past Medical History  Diagnosis Date  . History of renal calculi     with hydroneprosis noted during pregnancy  . Hemorrhoids in pregnancy   . UTI (lower urinary tract infection)   . AR (allergic rhinitis)   . Hydronephrosis   . Motion sickness     Past Surgical History  Procedure Laterality Date  . Cesarean section  2012    Current Outpatient Prescriptions on File Prior to Visit  Medication Sig Dispense Refill  . montelukast (SINGULAIR) 10 MG tablet Take 1 tablet (10 mg total) by mouth at bedtime. 30 tablet 5   No current facility-administered medications on file prior to visit.    Allergies  Allergen Reactions  . Sulfa Antibiotics Rash    Social History   Social History  . Marital Status: Married    Spouse Name: N/A  . Number of Children: N/A  . Years of Education: N/A   Occupational History  . Not on file.   Social History Main Topics  . Smoking status: Never Smoker   . Smokeless tobacco: Never Used  . Alcohol Use: No  . Drug Use: No  . Sexual Activity: Yes    Birth Control/ Protection: IUD   Other Topics Concern  . Not on file   Social History  Narrative    Family History  Problem Relation Age of Onset  . Mental illness Mother   . Heart disease Father 21    CAD s/p CABG  . Diabetes Father   . Skin cancer Father   . Diabetes Maternal Grandmother   . Hyperlipidemia Maternal Grandmother   . Stroke Maternal Grandmother   . Stroke Paternal Grandmother   . Cancer Neg Hx     The following portions of the patient's history were reviewed and updated as appropriate: allergies, current medications, past family history, past medical history, past social history, past surgical history and problem list.  Review of Systems ROS Review of Systems - General ROS: negative for - chills, fatigue, fever, hot flashes, night sweats, weight gain or weight loss Psychological ROS: negative for - anxiety, decreased libido, depression, mood swings, physical abuse or sexual abuse Ophthalmic ROS: negative for - blurry vision, eye pain or loss of vision ENT ROS: negative for - headaches, hearing change, visual changes or vocal changes Allergy and Immunology ROS: negative for - hives, itchy/watery eyes or seasonal allergies Hematological and Lymphatic ROS: negative for - bleeding problems, bruising, swollen lymph nodes or  weight loss Endocrine ROS: negative for - galactorrhea, hair pattern changes, hot flashes, malaise/lethargy, mood swings, palpitations, polydipsia/polyuria, skin changes, temperature intolerance or unexpected weight changes Breast ROS: negative for - new or changing breast lumps or nipple discharge Respiratory ROS: negative for - cough or shortness of breath Cardiovascular ROS: negative for - chest pain, irregular heartbeat, palpitations or shortness of breath Gastrointestinal ROS: no abdominal pain, change in bowel habits, or black or bloody stools Genito-Urinary ROS: no dysuria, trouble voiding, or hematuria Musculoskeletal ROS: negative for - joint pain or joint stiffness Neurological ROS: negative for - bowel and bladder control  changes Dermatological ROS: negative for rash and skin lesion changes   Objective:   BP 105/77 mmHg  Pulse 98  Ht 5\' 1"  (1.549 m)  Wt 122 lb 8 oz (55.566 kg)  BMI 23.16 kg/m2  Breastfeeding? No CONSTITUTIONAL: Well-developed, well-nourished female in no acute distress.  PSYCHIATRIC: Normal mood and affect. Normal behavior. Normal judgment and thought content. NEUROLGIC: Alert and oriented to person, place, and time. Normal muscle tone coordination. No cranial nerve deficit noted. HENT:  Normocephalic, atraumatic, External right and left ear normal. Oropharynx is clear and moist EYES: Conjunctivae and EOM are normal. Pupils are equal, round, and reactive to light. No scleral icterus.  NECK: Normal range of motion, supple, no masses.  Normal thyroid.  SKIN: Skin is warm and dry. No rash noted. Not diaphoretic. No erythema. No pallor. CARDIOVASCULAR: Normal heart rate noted, regular rhythm, no murmur. RESPIRATORY: Clear to auscultation bilaterally. Effort and breath sounds normal, no problems with respiration noted. BREASTS: Symmetric in size. No masses, skin changes, nipple drainage, or lymphadenopathy. ABDOMEN: Soft, normal bowel sounds, no distention noted.  No tenderness, rebound or guarding.  BLADDER: Normal PELVIC:  External Genitalia: Normal  BUS: Normal  Vagina: Normal  Cervix: Normal; IUD string 3 cm  Uterus: Normal; Midplane to anteverted, mobile, normal size and shape  Adnexa: Normal  RV: External Exam NormaI  MUSCULOSKELETAL: Normal range of motion. No tenderness.  No cyanosis, clubbing, or edema.  2+ distal pulses. LYMPHATIC: No Axillary, Supraclavicular, or Inguinal Adenopathy.    Assessment:   Annual gynecologic examination 29 y.o. Contraception: IUD Normal BMI Problem List Items Addressed This Visit    None      Plan:  Pap: Pap Co Test Mammogram: Not Indicated Stool Guaiac Testing:  Not Indicated Labs: lipid vit d tsh a1c fbs, Lipid 1, FBS, TSH,  Hemoglobin A1C and Vit D Level""all Routine preventative health maintenance measures emphasized: Exercise/Diet/Weight control, Tobacco Warnings, Alcohol/Substance use risks and Stress Management Psychiatrist referral, Dr. Maryruth BunKapur for ADD evaluation Return to Clinic - 1 Year   [address]Crystal KenlyMiller, CMA  Herold HarmsMartin A Lilyonna Steidle, MD

## 2015-10-25 NOTE — Patient Instructions (Signed)
1.  Pap smear. 2.  Referral for ADD evaluation and medication initiation-Dr. Maryruth BunKapur, psychiatry if in network. 3.  Return in 1 year

## 2015-10-28 ENCOUNTER — Other Ambulatory Visit
Admission: RE | Admit: 2015-10-28 | Discharge: 2015-10-28 | Disposition: A | Discharge status: Home / Self Care | Payer: 59 | Source: Ambulatory Visit | Attending: Obstetrics and Gynecology | Admitting: Obstetrics and Gynecology

## 2015-10-28 DIAGNOSIS — Z029 Encounter for administrative examinations, unspecified: Secondary | ICD-10-CM | POA: Diagnosis not present

## 2015-10-28 LAB — LIPID PANEL
CHOL/HDL RATIO: 3.5 ratio
Cholesterol: 138 mg/dL (ref 0–200)
HDL: 40 mg/dL — AB (ref >40)
LDL CALC: 83 mg/dL (ref 0–99)
Triglycerides: 75 mg/dL (ref <150)
VLDL: 15 mg/dL (ref 0–40)

## 2015-10-28 LAB — PAP IG AND HPV HIGH-RISK: PAP Smear Comment: 0

## 2015-10-28 LAB — HEMOGLOBIN A1C: Hgb A1c MFr Bld: 5.1 % (ref 4.0–6.0)

## 2015-10-28 LAB — TSH: TSH: 2.934 u[IU]/mL (ref 0.350–4.500)

## 2015-10-28 LAB — GLUCOSE, RANDOM: Glucose, Bld: 92 mg/dL (ref 65–99)

## 2015-11-03 LAB — VITAMIN D 1,25 DIHYDROXY
VITAMIN D 1, 25 (OH) TOTAL: 61 pg/mL
Vitamin D3 1, 25 (OH)2: 61 pg/mL

## 2015-11-04 ENCOUNTER — Encounter: Payer: Self-pay | Admitting: Internal Medicine

## 2015-11-05 ENCOUNTER — Encounter: Payer: Self-pay | Admitting: Internal Medicine

## 2015-11-22 ENCOUNTER — Encounter: Payer: Self-pay | Admitting: Physician Assistant

## 2015-11-22 ENCOUNTER — Ambulatory Visit: Payer: Self-pay | Admitting: Physician Assistant

## 2015-11-22 VITALS — BP 92/70 | HR 60 | Temp 98.6°F

## 2015-11-22 DIAGNOSIS — J069 Acute upper respiratory infection, unspecified: Secondary | ICD-10-CM

## 2015-11-22 MED ORDER — FLUTICASONE PROPIONATE 50 MCG/ACT NA SUSP: 2.0000 | Freq: Every day | NASAL | Status: DC

## 2015-11-22 MED ORDER — AMOXICILLIN 875 MG PO TABS: 875.0000 mg | ORAL_TABLET | Freq: Two times a day (BID) | ORAL | Status: DC

## 2015-11-22 MED ORDER — HYDROCOD POLST-CPM POLST ER 10-8 MG/5ML PO SUER: 5.0000 mL | Freq: Two times a day (BID) | ORAL | Status: DC | PRN

## 2015-11-22 MED ORDER — FLUCONAZOLE 150 MG PO TABS: 150.0000 mg | ORAL_TABLET | Freq: Once | ORAL | Status: DC

## 2015-11-22 NOTE — Progress Notes (Signed)
S: C/o runny nose and congestion for 5 days, no fever, chills, cp/sob, v/d; mucus is green and thick, cough is sporadic, c/o of facial and dental pain. Cough is keeping her awake at night  Using otc meds:   O: PE: perrl eomi, normocephalic, tms dull, nasal mucosa red and swollen, throat injected, neck supple no lymph, lungs c t a, cv rrr, neuro intact  A:  Acute sinusitis. uri   P: amoxil 875mg  bid x 10d, flonase, diflucan, tussionex 150ml nr; drink fluids, continue regular meds , use otc meds of choice, return if not improving in 5 days, return earlier if worsening

## 2015-12-19 ENCOUNTER — Encounter: Payer: Self-pay | Admitting: Physician Assistant

## 2015-12-19 ENCOUNTER — Ambulatory Visit: Payer: Self-pay | Admitting: Physician Assistant

## 2015-12-19 VITALS — BP 90/60 | Temp 98.6°F

## 2015-12-19 DIAGNOSIS — J0181 Other acute recurrent sinusitis: Secondary | ICD-10-CM

## 2015-12-19 MED ORDER — AZITHROMYCIN 250 MG PO TABS: ORAL_TABLET | ORAL | Status: DC

## 2015-12-19 MED ORDER — METHYLPREDNISOLONE 4 MG PO TBPK: ORAL_TABLET | ORAL | Status: DC

## 2015-12-19 NOTE — Progress Notes (Signed)
S: C/o runny nose and congestion for 4 days, no fever, chills, cp/sob, v/d; mucus is green and thick, cough is sporadic, c/o of facial and dental pain. Family members also sick  Using otc meds:   O: PE: vitals wnl, nad, perrl eomi, normocephalic, tms dull, nasal mucosa red and swollen, throat injected, neck supple no lymph, lungs c t a, cv rrr, neuro intact  A:  Acute sinusitis   P: zpack, medrol dose pack; drink fluids, continue regular meds , use otc meds of choice, return if not improving in 5 days, return earlier if worsening

## 2016-01-17 ENCOUNTER — Encounter: Payer: Self-pay | Admitting: Internal Medicine

## 2016-01-19 ENCOUNTER — Encounter: Payer: Self-pay | Admitting: Physician Assistant

## 2016-01-19 ENCOUNTER — Ambulatory Visit: Payer: Self-pay | Admitting: Physician Assistant

## 2016-01-19 VITALS — BP 100/80 | Temp 97.9°F

## 2016-01-19 DIAGNOSIS — J0181 Other acute recurrent sinusitis: Secondary | ICD-10-CM

## 2016-01-19 MED ORDER — CEFDINIR 300 MG PO CAPS: 300.0000 mg | ORAL_CAPSULE | Freq: Two times a day (BID) | ORAL | Status: DC

## 2016-01-19 NOTE — Progress Notes (Signed)
S: c/o recurrent sinus problems, got better when finished the amoxil/zpack, thinks its a separate incident, had a lot of pressure and used prednisone which helped, is still getting deep yellow mucus out, no fever/chills/bodyaches  O: vitals wnl, nad, tms dull, nasal mucosa red and swollen, throat wnl, neck supple no lymph, lungs c t a, cv rrr  A: acute recurrent sinusitis  P:  omnicef  bid x 10d,

## 2016-02-14 DIAGNOSIS — J301 Allergic rhinitis due to pollen: Secondary | ICD-10-CM | POA: Diagnosis not present

## 2016-02-24 DIAGNOSIS — J301 Allergic rhinitis due to pollen: Secondary | ICD-10-CM | POA: Diagnosis not present

## 2016-02-27 DIAGNOSIS — J301 Allergic rhinitis due to pollen: Secondary | ICD-10-CM | POA: Diagnosis not present

## 2016-03-01 DIAGNOSIS — J301 Allergic rhinitis due to pollen: Secondary | ICD-10-CM | POA: Diagnosis not present

## 2016-03-05 DIAGNOSIS — J301 Allergic rhinitis due to pollen: Secondary | ICD-10-CM | POA: Diagnosis not present

## 2016-03-07 ENCOUNTER — Encounter: Payer: Self-pay | Admitting: Internal Medicine

## 2016-03-07 ENCOUNTER — Ambulatory Visit (INDEPENDENT_AMBULATORY_CARE_PROVIDER_SITE_OTHER): Payer: 59 | Admitting: Internal Medicine

## 2016-03-07 VITALS — BP 97/63 | HR 61 | Temp 98.6°F | Wt 124.2 lb

## 2016-03-07 DIAGNOSIS — J329 Chronic sinusitis, unspecified: Secondary | ICD-10-CM

## 2016-03-07 MED ORDER — HYDROCOD POLST-CPM POLST ER 10-8 MG/5ML PO SUER: 5.0000 mL | Freq: Two times a day (BID) | ORAL | Status: DC | PRN

## 2016-03-07 MED ORDER — METHYLPREDNISOLONE 4 MG PO TBPK: ORAL_TABLET | ORAL | Status: DC

## 2016-03-07 MED ORDER — DOXYCYCLINE HYCLATE 100 MG PO TABS: 100.0000 mg | ORAL_TABLET | Freq: Two times a day (BID) | ORAL | Status: DC

## 2016-03-07 NOTE — Assessment & Plan Note (Signed)
Recurrent sinus infections, treated with Medrol, Augmentin, Zpack, and Omnicef over the last 3 months. Now followed by ENT, starting allergy shots. Exam today is normal. Encouraged her to give this a little longer before starting antibiotics and steroids. Encouraged her to follow up with Dr. Andee PolesVaught. If symptoms worsening over the weekend, will start Doxycycline and Medrol. Tussionex for cough.

## 2016-03-07 NOTE — Progress Notes (Signed)
Subjective:    Patient ID: Brandi Johnson, female    DOB: [birth year], 30 y.o.   MRN: [id number]  HPI  29YO female presents for acute visit.  Seen by Greig RightSusan Fisher in 12/2017 for sinus infection. Treated with Omnicef. Also treated for 12/2015 with Zpack and Medrol in 12/2015 for sinus infection, and with Augmentin in 11/2015 for sinus infection.  Seen by ENT, Dr. Andee PolesVaught. Started allergy shots Monday. Monday night developed nasal congestion. Some green drainage at night. During daytime coughing. Some night cough and congestion which makes it harder to sleep. No fever, chills.  Using humidifier and saline rinse with some improvement. Using Dymista and Singulair and Claritin.   Wt Readings from Last 3 Encounters:  03/07/16 124 lb 4 oz (56.359 kg)  10/25/15 122 lb 8 oz (55.566 kg)  03/25/14 129 lb 12 oz (58.854 kg)   BP Readings from Last 3 Encounters:  03/07/16 97/63  01/19/16 100/80  12/19/15 90/60    Past Medical History  Diagnosis Date  . History of renal calculi     with hydroneprosis noted during pregnancy  . Hemorrhoids in pregnancy   . UTI (lower urinary tract infection)   . AR (allergic rhinitis)   . Hydronephrosis   . Motion sickness    Family History  Problem Relation Age of Onset  . Mental illness Mother   . Heart disease Father [id number]    CAD s/p CABG  . Diabetes Father   . Skin cancer Father   . Diabetes Maternal Grandmother   . Hyperlipidemia Maternal Grandmother   . Stroke Maternal Grandmother   . Stroke Paternal Grandmother   . Cancer Neg Hx   . Celiac disease Sister   . Colitis Mother     collagenous   Past Surgical History  Procedure Laterality Date  . Cesarean section  2012   Social History   Social History  . Marital Status: Married    Spouse Name: N/A  . Number of Children: N/A  . Years of Education: N/A   Social History Main Topics  . Smoking status: Never Smoker   . Smokeless tobacco: Never Used  . Alcohol Use: No  . Drug Use: No    . Sexual Activity: Yes    Birth Control/ Protection: IUD   Other Topics Concern  . None   Social History Narrative    Review of Systems  Constitutional: Negative for fever, chills and unexpected weight change.  HENT: Positive for congestion, postnasal drip, rhinorrhea and sinus pressure. Negative for ear discharge, ear pain, facial swelling, hearing loss, mouth sores, nosebleeds, sneezing, sore throat, tinnitus, trouble swallowing and voice change.   Eyes: Negative for pain, discharge, redness and visual disturbance.  Respiratory: Positive for cough. Negative for chest tightness, shortness of breath, wheezing and stridor.   Cardiovascular: Negative for chest pain, palpitations and leg swelling.  Musculoskeletal: Negative for myalgias, arthralgias, neck pain and neck stiffness.  Skin: Negative for color change and rash.  Neurological: Negative for dizziness, weakness, light-headedness and headaches.  Hematological: Negative for adenopathy.       Objective:    BP 97/63 mmHg  Pulse 61  Temp(Src) 98.6 F (37 C) (Oral)  Wt 124 lb 4 oz (56.359 kg)  SpO2 99% Physical Exam  Constitutional: She is oriented to person, place, and time. She appears well-developed and well-nourished. No distress.  HENT:  Head: Normocephalic and atraumatic.  Right Ear: External ear normal.  Left Ear: External ear normal.  Nose: Nose normal.  Mouth/Throat: Oropharynx is clear and moist. No oropharyngeal exudate.  Eyes: Conjunctivae are normal. Pupils are equal, round, and reactive to light. Right eye exhibits no discharge. Left eye exhibits no discharge. No scleral icterus.  Neck: Normal range of motion. Neck supple. No tracheal deviation present. No thyromegaly present.  Cardiovascular: Normal rate, regular rhythm, normal heart sounds and intact distal pulses.  Exam reveals no gallop and no friction rub.   No murmur heard. Pulmonary/Chest: Effort normal and breath sounds normal. No respiratory distress.  She has no wheezes. She has no rales. She exhibits no tenderness.  Musculoskeletal: Normal range of motion. She exhibits no edema or tenderness.  Lymphadenopathy:    She has no cervical adenopathy.  Neurological: She is alert and oriented to person, place, and time. No cranial nerve deficit. She exhibits normal muscle tone. Coordination normal.  Skin: Skin is warm and dry. No rash noted. She is not diaphoretic. No erythema. No pallor.  Psychiatric: She has a normal mood and affect. Her behavior is normal. Judgment and thought content normal.          Assessment & Plan:   Problem List Items Addressed This Visit      Unprioritized   Recurrent sinus infections - Primary    Recurrent sinus infections, treated with Medrol, Augmentin, Zpack, and Omnicef over the last 3 months. Now followed by ENT, starting allergy shots. Exam today is normal. Encouraged her to give this a little longer before starting antibiotics and steroids. Encouraged her to follow up with Dr. Andee Poles. If symptoms worsening over the weekend, will start Doxycycline and Medrol. Tussionex for cough.      Relevant Medications   Azelastine-Fluticasone (DYMISTA) 137-50 MCG/ACT SUSP   doxycycline (VIBRA-TABS) 100 MG tablet   methylPREDNISolone (MEDROL DOSEPAK) 4 MG TBPK tablet   chlorpheniramine-HYDROcodone (TUSSIONEX PENNKINETIC ER) 10-8 MG/5ML SUER       Return in about 4 weeks (around 04/04/2016) for Recheck.  Ronna Polio, MD Internal Medicine [facility]

## 2016-03-07 NOTE — Progress Notes (Signed)
Pre visit review using our clinic review tool, if applicable. No additional management support is needed unless otherwise documented below in the visit note. 

## 2016-03-07 NOTE — Patient Instructions (Signed)
Continue Dymista, Singulair and Claritin.  If symptoms persistent end of this week, then start Medrol dosepak and Doxycycline.  Please let Dr. Andee PolesVaught know if you are starting Medrol.  Follow up as needed.

## 2016-03-08 DIAGNOSIS — J301 Allergic rhinitis due to pollen: Secondary | ICD-10-CM | POA: Diagnosis not present

## 2016-03-09 DIAGNOSIS — J301 Allergic rhinitis due to pollen: Secondary | ICD-10-CM | POA: Diagnosis not present

## 2016-03-12 DIAGNOSIS — J301 Allergic rhinitis due to pollen: Secondary | ICD-10-CM | POA: Diagnosis not present

## 2016-03-15 DIAGNOSIS — J301 Allergic rhinitis due to pollen: Secondary | ICD-10-CM | POA: Diagnosis not present

## 2016-03-19 DIAGNOSIS — J301 Allergic rhinitis due to pollen: Secondary | ICD-10-CM | POA: Diagnosis not present

## 2016-03-22 DIAGNOSIS — J301 Allergic rhinitis due to pollen: Secondary | ICD-10-CM | POA: Diagnosis not present

## 2016-03-26 DIAGNOSIS — J301 Allergic rhinitis due to pollen: Secondary | ICD-10-CM | POA: Diagnosis not present

## 2016-03-29 DIAGNOSIS — J301 Allergic rhinitis due to pollen: Secondary | ICD-10-CM | POA: Diagnosis not present

## 2016-03-30 DIAGNOSIS — J301 Allergic rhinitis due to pollen: Secondary | ICD-10-CM | POA: Diagnosis not present

## 2016-04-02 DIAGNOSIS — J301 Allergic rhinitis due to pollen: Secondary | ICD-10-CM | POA: Diagnosis not present

## 2016-04-05 DIAGNOSIS — J301 Allergic rhinitis due to pollen: Secondary | ICD-10-CM | POA: Diagnosis not present

## 2016-04-06 ENCOUNTER — Ambulatory Visit (INDEPENDENT_AMBULATORY_CARE_PROVIDER_SITE_OTHER): Payer: 59 | Admitting: Internal Medicine

## 2016-04-06 ENCOUNTER — Encounter: Payer: Self-pay | Admitting: Internal Medicine

## 2016-04-06 VITALS — BP 108/70 | HR 67 | Temp 97.5°F | Resp 12 | Ht 61.0 in | Wt 125.2 lb

## 2016-04-06 DIAGNOSIS — J329 Chronic sinusitis, unspecified: Secondary | ICD-10-CM | POA: Diagnosis not present

## 2016-04-06 NOTE — Progress Notes (Signed)
Pre-visit discussion using our clinic review tool. No additional management support is needed unless otherwise documented below in the visit note.  

## 2016-04-06 NOTE — Progress Notes (Signed)
Subjective:  Patient ID: Brandi Johnson Burmester, female    DOB: [birth year]  Age: 30 y.o. MRN: [id number]  CC: The encounter diagnosis was Recurrent sinus infections.unless kids  HPI Brandi Johnson Henton presents for  Follow up on recurrent sinusitis.  Treated by Dr Dan HumphreysWalker one month ago for 4th bacterial sinusitis in 4 months.  Had just started allergy shots several days prior. Symptoms resolved over the weekend without complication.    Has been receiving  allergy shots twice weekly since early March, and using singulair and claritin daily  with good improvement.    No use of decongestants in the last month .    Sleeping well unless the kids are not sleeping well.     Outpatient Prescriptions Prior to Visit  Medication Sig Dispense Refill  . Azelastine-Fluticasone (DYMISTA) 137-50 MCG/ACT SUSP Place into the nose.    . chlorpheniramine-HYDROcodone (TUSSIONEX PENNKINETIC ER) 10-8 MG/5ML SUER Take 5 mLs by mouth every 12 (twelve) hours as needed for cough. 140 mL 0  . fluticasone (FLONASE) 50 MCG/ACT nasal spray Place 2 sprays into both nostrils daily. 16 g 6  . levonorgestrel (MIRENA) 20 MCG/24HR IUD 1 each by Intrauterine route once.    . montelukast (SINGULAIR) 10 MG tablet Take 1 tablet (10 mg total) by mouth at bedtime. 30 tablet 6  . PRENATAL 28-0.8 MG TABS Take by mouth.    . doxycycline (VIBRA-TABS) 100 MG tablet Take 1 tablet (100 mg total) by mouth 2 (two) times daily. (Patient not taking: Reported on 04/06/2016) 20 tablet 0  . methylPREDNISolone (MEDROL DOSEPAK) 4 MG TBPK tablet Use as directed (Patient not taking: Reported on 04/06/2016) 21 tablet 0   No facility-administered medications prior to visit.    Review of Systems;  Patient denies headache, fevers, malaise, unintentional weight loss, skin rash, eye pain, sinus congestion and sinus pain, sore throat, dysphagia,  hemoptysis , cough, dyspnea, wheezing, chest pain, palpitations, orthopnea, edema, abdominal pain, nausea, melena,  diarrhea, constipation, flank pain, dysuria, hematuria, urinary  Frequency, nocturia, numbness, tingling, seizures,  Focal weakness, Loss of consciousness,  Tremor, insomnia, depression, anxiety, and suicidal ideation.      Objective:  BP 108/70 mmHg  Pulse 67  Temp(Src) 97.5 F (36.4 C) (Oral)  Resp 12  Ht 5\' 1"  (1.549 m)  Wt 125 lb 4 oz (56.813 kg)  BMI 23.68 kg/m2  SpO2 98%  BP Readings from Last 3 Encounters:  04/06/16 108/70  03/07/16 97/63  01/19/16 100/80    Wt Readings from Last 3 Encounters:  04/06/16 125 lb 4 oz (56.813 kg)  03/07/16 124 lb 4 oz (56.359 kg)  10/25/15 122 lb 8 oz (55.566 kg)    General appearance: alert, cooperative and appears stated age Ears: normal TM's and external ear canals both ears Throat: lips, mucosa, and tongue normal; teeth and gums normal Neck: no adenopathy, no carotid bruit, supple, symmetrical, trachea midline and thyroid not enlarged, symmetric, no tenderness/mass/nodules Back: symmetric, no curvature. ROM normal. No CVA tenderness. Lungs: clear to auscultation bilaterally Heart: regular rate and rhythm, S1, S2 normal, no murmur, click, rub or gallop Abdomen: soft, non-tender; bowel sounds normal; no masses,  no organomegaly Pulses: 2+ and symmetric Skin: Skin color, texture, turgor normal. No rashes or lesions Lymph nodes: Cervical, supraclavicular, and axillary nodes normal.  Lab Results  Component Value Date   HGBA1C 5.1 10/28/2015    Lab Results  Component Value Date   CREATININE 0.67 05/22/2013    Lab Results  Component Value  Date   WBC 6.1 02/26/2014   HGB 12.3 02/26/2014   HCT 26.7* 03/02/2014   PLT 153 02/26/2014   GLUCOSE 92 10/28/2015   CHOL 138 10/28/2015   TRIG 75 10/28/2015   HDL 40* 10/28/2015   LDLCALC 83 10/28/2015   ALT 17 05/22/2013   AST 18 05/22/2013   NA 137 05/22/2013   K 3.8 05/22/2013   CL 102 05/22/2013   CREATININE 0.67 05/22/2013   BUN 15 05/22/2013   CO2 26 05/22/2013   TSH 2.934  10/28/2015   HGBA1C 5.1 10/28/2015    No results found.  Assessment & Plan:   Problem List Items Addressed This Visit    Recurrent sinus infections - Primary    Improved with initiation of allergy shots to manage allergic rhinitis          I am having Ms. Cerrone maintain her PRENATAL, levonorgestrel, montelukast, fluticasone, Azelastine-Fluticasone, doxycycline, methylPREDNISolone, and chlorpheniramine-HYDROcodone.  No orders of the defined types were placed in this encounter.    There are no discontinued medications.  Follow-up: No Follow-up on file.   Sherlene Shams, MD

## 2016-04-08 NOTE — Assessment & Plan Note (Signed)
Improved with initiation of allergy shots to manage allergic rhinitis

## 2016-04-09 DIAGNOSIS — J301 Allergic rhinitis due to pollen: Secondary | ICD-10-CM | POA: Diagnosis not present

## 2016-04-12 DIAGNOSIS — J301 Allergic rhinitis due to pollen: Secondary | ICD-10-CM | POA: Diagnosis not present

## 2016-04-16 DIAGNOSIS — J301 Allergic rhinitis due to pollen: Secondary | ICD-10-CM | POA: Diagnosis not present

## 2016-04-17 DIAGNOSIS — J301 Allergic rhinitis due to pollen: Secondary | ICD-10-CM | POA: Diagnosis not present

## 2016-04-19 DIAGNOSIS — J301 Allergic rhinitis due to pollen: Secondary | ICD-10-CM | POA: Diagnosis not present

## 2016-04-30 DIAGNOSIS — J301 Allergic rhinitis due to pollen: Secondary | ICD-10-CM | POA: Diagnosis not present

## 2016-05-03 DIAGNOSIS — J301 Allergic rhinitis due to pollen: Secondary | ICD-10-CM | POA: Diagnosis not present

## 2016-05-07 DIAGNOSIS — J301 Allergic rhinitis due to pollen: Secondary | ICD-10-CM | POA: Diagnosis not present

## 2016-05-10 DIAGNOSIS — J301 Allergic rhinitis due to pollen: Secondary | ICD-10-CM | POA: Diagnosis not present

## 2016-05-14 DIAGNOSIS — J301 Allergic rhinitis due to pollen: Secondary | ICD-10-CM | POA: Diagnosis not present

## 2016-05-17 ENCOUNTER — Other Ambulatory Visit: Payer: Self-pay

## 2016-05-17 ENCOUNTER — Encounter: Payer: Self-pay | Admitting: Obstetrics and Gynecology

## 2016-05-17 DIAGNOSIS — J301 Allergic rhinitis due to pollen: Secondary | ICD-10-CM | POA: Diagnosis not present

## 2016-05-17 MED ORDER — MONTELUKAST SODIUM 10 MG PO TABS: 10.0000 mg | ORAL_TABLET | Freq: Every day | ORAL | Status: DC

## 2016-05-17 NOTE — Telephone Encounter (Signed)
Singulair refilled. Pt aware per my chart.

## 2016-05-21 DIAGNOSIS — J301 Allergic rhinitis due to pollen: Secondary | ICD-10-CM | POA: Diagnosis not present

## 2016-05-24 DIAGNOSIS — J301 Allergic rhinitis due to pollen: Secondary | ICD-10-CM | POA: Diagnosis not present

## 2016-05-25 DIAGNOSIS — J301 Allergic rhinitis due to pollen: Secondary | ICD-10-CM | POA: Diagnosis not present

## 2016-06-04 DIAGNOSIS — J301 Allergic rhinitis due to pollen: Secondary | ICD-10-CM | POA: Diagnosis not present

## 2016-06-07 DIAGNOSIS — J301 Allergic rhinitis due to pollen: Secondary | ICD-10-CM | POA: Diagnosis not present

## 2016-06-11 DIAGNOSIS — J301 Allergic rhinitis due to pollen: Secondary | ICD-10-CM | POA: Diagnosis not present

## 2016-06-14 DIAGNOSIS — J301 Allergic rhinitis due to pollen: Secondary | ICD-10-CM | POA: Diagnosis not present

## 2016-06-18 DIAGNOSIS — J301 Allergic rhinitis due to pollen: Secondary | ICD-10-CM | POA: Diagnosis not present

## 2016-06-25 DIAGNOSIS — J301 Allergic rhinitis due to pollen: Secondary | ICD-10-CM | POA: Diagnosis not present

## 2016-06-28 DIAGNOSIS — J301 Allergic rhinitis due to pollen: Secondary | ICD-10-CM | POA: Diagnosis not present

## 2016-07-02 DIAGNOSIS — J301 Allergic rhinitis due to pollen: Secondary | ICD-10-CM | POA: Diagnosis not present

## 2016-07-11 DIAGNOSIS — J301 Allergic rhinitis due to pollen: Secondary | ICD-10-CM | POA: Diagnosis not present

## 2016-08-24 DIAGNOSIS — J301 Allergic rhinitis due to pollen: Secondary | ICD-10-CM | POA: Diagnosis not present

## 2016-08-30 DIAGNOSIS — J301 Allergic rhinitis due to pollen: Secondary | ICD-10-CM | POA: Diagnosis not present

## 2016-10-25 ENCOUNTER — Encounter: Payer: Self-pay | Admitting: Physician Assistant

## 2016-10-25 ENCOUNTER — Other Ambulatory Visit: Payer: Self-pay | Admitting: Emergency Medicine

## 2016-10-25 ENCOUNTER — Ambulatory Visit: Payer: Self-pay | Admitting: Physician Assistant

## 2016-10-25 VITALS — BP 110/68 | HR 76 | Temp 97.9°F

## 2016-10-25 DIAGNOSIS — J0100 Acute maxillary sinusitis, unspecified: Secondary | ICD-10-CM

## 2016-10-25 MED ORDER — METHYLPREDNISOLONE 4 MG PO TBPK: ORAL_TABLET | ORAL | 0 refills | Status: DC

## 2016-10-25 NOTE — Progress Notes (Signed)
S: C/o runny nose and congestion for 3 days, sinus pain and pressure, no fever, chills, cp/sob, v/d;  Using otc meds:   O: PE: vitals wnl, perrl eomi, normocephalic, tms dull, nasal mucosa red and swollen, throat injected, neck supple no lymph, lungs c t a, cv rrr, neuro intact  A:  Acute sinusitis   P: drink fluids, continue regular meds , use otc meds of choice, return if not improving in 5 days, return earlier if worsening , trial of steroids, if not better by Monday will call in either augmentin or omnicef

## 2016-10-26 NOTE — Progress Notes (Signed)
Patient contacted office requesting Brandi Johnson to call in the antibiotic that they spoke of during her visit. Per Brandi Johnson ok'd called into pharmacy spoke with Morrie SheldonAshley

## 2016-11-01 ENCOUNTER — Ambulatory Visit (INDEPENDENT_AMBULATORY_CARE_PROVIDER_SITE_OTHER): Payer: 59 | Admitting: Obstetrics and Gynecology

## 2016-11-01 ENCOUNTER — Encounter: Payer: Self-pay | Admitting: Obstetrics and Gynecology

## 2016-11-01 VITALS — BP 114/78 | HR 85 | Ht 61.5 in | Wt 120.3 lb

## 2016-11-01 DIAGNOSIS — Z01419 Encounter for gynecological examination (general) (routine) without abnormal findings: Secondary | ICD-10-CM | POA: Diagnosis not present

## 2016-11-01 DIAGNOSIS — J301 Allergic rhinitis due to pollen: Secondary | ICD-10-CM | POA: Diagnosis not present

## 2016-11-01 NOTE — Progress Notes (Signed)
Subjective:   Brandi Johnson is a 30 y.o. [id number] Caucasian female here for a routine well-woman exam.  No LMP recorded. Patient is not currently having periods (Reason: IUD).    Current complaints: none PCP: [facility]       doesn't desire labs  Social History: Sexual: heterosexual Marital Status: married Living situation: with family Occupation: works at [employer] Tobacco/alcohol: no tobacco use Illicit drugs: no history of illicit drug use  The following portions of the patient's history were reviewed and updated as appropriate: allergies, current medications, past family history, past medical history, past social history, past surgical history and problem list.  Past Medical History Past Medical History:  Diagnosis Date  . AR (allergic rhinitis)   . Hemorrhoids in pregnancy   . History of renal calculi    with hydroneprosis noted during pregnancy  . Hydronephrosis   . Motion sickness   . UTI (lower urinary tract infection)     Past Surgical History Past Surgical History:  Procedure Laterality Date  . CESAREAN SECTION  2012    Gynecologic History [id number]  No LMP recorded. Patient is not currently having periods (Reason: IUD). Contraception: IUD Last Pap: 2016. Results were: normal   Obstetric History OB History  Gravida Para Term Preterm AB Living  2 2 2     2   SAB TAB Ectopic Multiple Live Births          2    # Outcome Date GA Lbr Len/2nd Weight Sex Delivery Anes PTL Lv  2 Term 2015   7 lb 6.4 oz (3.357 kg) F CS-LTranv   LIV  1 Term 2012   8 lb 1.6 oz (3.674 kg) M CS-LTranv   LIV      Current Medications Current Outpatient Prescriptions on File Prior to Visit  Medication Sig Dispense Refill  . fluticasone (FLONASE) 50 MCG/ACT nasal spray Place 2 sprays into both nostrils daily. 16 g 6  . levonorgestrel (MIRENA) 20 MCG/24HR IUD 1 each by Intrauterine route once.    . montelukast (SINGULAIR) 10 MG tablet Take 1 tablet (10 mg total) by mouth at bedtime. 30  tablet 6  . methylPREDNISolone (MEDROL DOSEPAK) 4 MG TBPK tablet Use as directed (Patient not taking: Reported on 11/01/2016) 21 tablet 0   No current facility-administered medications on file prior to visit.     Review of Systems Patient denies any headaches, blurred vision, shortness of breath, chest pain, abdominal pain, problems with bowel movements, urination, or intercourse.  Objective:  BP 114/78   Pulse 85   Ht 5' 1.5" (1.562 m)   Wt 120 lb 4.8 oz (54.6 kg)   BMI 22.36 kg/m  Physical Exam  General:  Well developed, well nourished, no acute distress. She is alert and oriented x3. Skin:  Warm and dry Neck:  Midline trachea, no thyromegaly or nodules Cardiovascular: Regular rate and rhythm, no murmur heard Lungs:  Effort normal, all lung fields clear to auscultation bilaterally Breasts:  No dominant palpable mass, retraction, or nipple discharge Abdomen:  Soft, non tender, no hepatosplenomegaly or masses Pelvic:  External genitalia is normal in appearance.  The vagina is normal in appearance. The cervix is bulbous, no CMT.  Thin prep pap is not done. Uterus is felt to be normal size, shape, and contour.  No adnexal masses or tenderness noted. IUD string noted Extremities:  No swelling or varicosities noted Psych:  She has a normal mood and affect  Assessment:   Healthy well-woman exam IUD amenorrhea  Plan:  Labs obtained F/U 1 year for AE, or sooner if needed   Kaedon Fanelli Suzan Nailer, CNM

## 2016-11-01 NOTE — Patient Instructions (Signed)
Preventive Care for Adults, Female A healthy lifestyle and preventive care can promote health and wellness. Preventive health guidelines for women include the following key practices.  A routine yearly physical is a good way to check with your health care provider about your health and preventive screening. It is a chance to share any concerns and updates on your health and to receive a thorough exam.  Visit your dentist for a routine exam and preventive care every 6 months. Brush your teeth twice a day and floss once a day. Good oral hygiene prevents tooth decay and gum disease.  The frequency of eye exams is based on your age, health, family medical history, use of contact lenses, and other factors. Follow your health care provider's recommendations for frequency of eye exams.  Eat a healthy diet. Foods like vegetables, fruits, whole grains, low-fat dairy products, and lean protein foods contain the nutrients you need without too many calories. Decrease your intake of foods high in solid fats, added sugars, and salt. Eat the right amount of calories for you.Get information about a proper diet from your health care provider, if necessary.  Regular physical exercise is one of the most important things you can do for your health. Most adults should get at least 150 minutes of moderate-intensity exercise (any activity that increases your heart rate and causes you to sweat) each week. In addition, most adults need muscle-strengthening exercises on 2 or more days a week.  Maintain a healthy weight. The body mass index (BMI) is a screening tool to identify possible weight problems. It provides an estimate of body fat based on height and weight. Your health care provider can find your BMI and can help you achieve or maintain a healthy weight.For adults 20 years and older:  A BMI below 18.5 is considered underweight.  A BMI of 18.5 to 24.9 is normal.  A BMI of 25 to 29.9 is considered  overweight.  A BMI of 30 and above is considered obese.  Maintain normal blood lipids and cholesterol levels by exercising and minimizing your intake of saturated fat. Eat a balanced diet with plenty of fruit and vegetables. Blood tests for lipids and cholesterol should begin at age 64 and be repeated every 5 years. If your lipid or cholesterol levels are high, you are over 50, or you are at high risk for heart disease, you may need your cholesterol levels checked more frequently.Ongoing high lipid and cholesterol levels should be treated with medicines if diet and exercise are not working.  If you smoke, find out from your health care provider how to quit. If you do not use tobacco, do not start.  Lung cancer screening is recommended for adults aged 52-80 years who are at high risk for developing lung cancer because of a history of smoking. A yearly low-dose CT scan of the lungs is recommended for people who have at least a 30-pack-year history of smoking and are a current smoker or have quit within the past 15 years. A pack year of smoking is smoking an average of 1 pack of cigarettes a day for 1 year (for example: 1 pack a day for 30 years or 2 packs a day for 15 years). Yearly screening should continue until the smoker has stopped smoking for at least 15 years. Yearly screening should be stopped for people who develop a health problem that would prevent them from having lung cancer treatment.  If you are pregnant, do not drink alcohol. If you are  breastfeeding, be very cautious about drinking alcohol. If you are not pregnant and choose to drink alcohol, do not have more than 1 drink per day. One drink is considered to be 12 ounces (355 mL) of beer, 5 ounces (148 mL) of wine, or 1.5 ounces (44 mL) of liquor.  Avoid use of street drugs. Do not share needles with anyone. Ask for help if you need support or instructions about stopping the use of drugs.  High blood pressure causes heart disease and  increases the risk of stroke. Your blood pressure should be checked at least every 1 to 2 years. Ongoing high blood pressure should be treated with medicines if weight loss and exercise do not work.  If you are 25-78 years old, ask your health care provider if you should take aspirin to prevent strokes.  Diabetes screening is done by taking a blood sample to check your blood glucose level after you have not eaten for a certain period of time (fasting). If you are not overweight and you do not have risk factors for diabetes, you should be screened once every 3 years starting at age 86. If you are overweight or obese and you are 3-87 years of age, you should be screened for diabetes every year as part of your cardiovascular risk assessment.  Breast cancer screening is essential preventive care for women. You should practice "breast self-awareness." This means understanding the normal appearance and feel of your breasts and may include breast self-examination. Any changes detected, no matter how small, should be reported to a health care provider. Women in their 66s and 30s should have a clinical breast exam (CBE) by a health care provider as part of a regular health exam every 1 to 3 years. After age 43, women should have a CBE every year. Starting at age 37, women should consider having a mammogram (breast X-ray test) every year. Women who have a family history of breast cancer should talk to their health care provider about genetic screening. Women at a high risk of breast cancer should talk to their health care providers about having an MRI and a mammogram every year.  Breast cancer gene (BRCA)-related cancer risk assessment is recommended for women who have family members with BRCA-related cancers. BRCA-related cancers include breast, ovarian, tubal, and peritoneal cancers. Having family members with these cancers may be associated with an increased risk for harmful changes (mutations) in the breast  cancer genes BRCA1 and BRCA2. Results of the assessment will determine the need for genetic counseling and BRCA1 and BRCA2 testing.  Your health care provider may recommend that you be screened regularly for cancer of the pelvic organs (ovaries, uterus, and vagina). This screening involves a pelvic examination, including checking for microscopic changes to the surface of your cervix (Pap test). You may be encouraged to have this screening done every 3 years, beginning at age 78.  For women ages 79-65, health care providers may recommend pelvic exams and Pap testing every 3 years, or they may recommend the Pap and pelvic exam, combined with testing for human papilloma virus (HPV), every 5 years. Some types of HPV increase your risk of cervical cancer. Testing for HPV may also be done on women of any age with unclear Pap test results.  Other health care providers may not recommend any screening for nonpregnant women who are considered low risk for pelvic cancer and who do not have symptoms. Ask your health care provider if a screening pelvic exam is right for  you.  If you have had past treatment for cervical cancer or a condition that could lead to cancer, you need Pap tests and screening for cancer for at least 20 years after your treatment. If Pap tests have been discontinued, your risk factors (such as having a new sexual partner) need to be reassessed to determine if screening should resume. Some women have medical problems that increase the chance of getting cervical cancer. In these cases, your health care provider may recommend more frequent screening and Pap tests.  Colorectal cancer can be detected and often prevented. Most routine colorectal cancer screening begins at the age of 50 years and continues through age 75 years. However, your health care provider may recommend screening at an earlier age if you have risk factors for colon cancer. On a yearly basis, your health care provider may provide  home test kits to check for hidden blood in the stool. Use of a small camera at the end of a tube, to directly examine the colon (sigmoidoscopy or colonoscopy), can detect the earliest forms of colorectal cancer. Talk to your health care provider about this at age 50, when routine screening begins. Direct exam of the colon should be repeated every 5-10 years through age 75 years, unless early forms of precancerous polyps or small growths are found.  People who are at an increased risk for hepatitis B should be screened for this virus. You are considered at high risk for hepatitis B if:  You were born in a country where hepatitis B occurs often. Talk with your health care provider about which countries are considered high risk.  Your parents were born in a high-risk country and you have not received a shot to protect against hepatitis B (hepatitis B vaccine).  You have HIV or AIDS.  You use needles to inject street drugs.  You live with, or have sex with, someone who has hepatitis B.  You get hemodialysis treatment.  You take certain medicines for conditions like cancer, organ transplantation, and autoimmune conditions.  Hepatitis C blood testing is recommended for all people born from 1945 through 1965 and any individual with known risks for hepatitis C.  Practice safe sex. Use condoms and avoid high-risk sexual practices to reduce the spread of sexually transmitted infections (STIs). STIs include gonorrhea, chlamydia, syphilis, trichomonas, herpes, HPV, and human immunodeficiency virus (HIV). Herpes, HIV, and HPV are viral illnesses that have no cure. They can result in disability, cancer, and death.  You should be screened for sexually transmitted illnesses (STIs) including gonorrhea and chlamydia if:  You are sexually active and are younger than 24 years.  You are older than 24 years and your health care provider tells you that you are at risk for this type of infection.  Your sexual  activity has changed since you were last screened and you are at an increased risk for chlamydia or gonorrhea. Ask your health care provider if you are at risk.  If you are at risk of being infected with HIV, it is recommended that you take a prescription medicine daily to prevent HIV infection. This is called preexposure prophylaxis (PrEP). You are considered at risk if:  You are sexually active and do not regularly use condoms or know the HIV status of your partner(s).  You take drugs by injection.  You are sexually active with a partner who has HIV.  Talk with your health care provider about whether you are at high risk of being infected with HIV. If   you choose to begin PrEP, you should first be tested for HIV. You should then be tested every 3 months for as long as you are taking PrEP.  Osteoporosis is a disease in which the bones lose minerals and strength with aging. This can result in serious bone fractures or breaks. The risk of osteoporosis can be identified using a bone density scan. Women ages 1 years and over and women at risk for fractures or osteoporosis should discuss screening with their health care providers. Ask your health care provider whether you should take a calcium supplement or vitamin D to reduce the rate of osteoporosis.  Menopause can be associated with physical symptoms and risks. Hormone replacement therapy is available to decrease symptoms and risks. You should talk to your health care provider about whether hormone replacement therapy is right for you.  Use sunscreen. Apply sunscreen liberally and repeatedly throughout the day. You should seek shade when your shadow is shorter than you. Protect yourself by wearing long sleeves, pants, a wide-brimmed hat, and sunglasses year round, whenever you are outdoors.  Once a month, do a whole body skin exam, using a mirror to look at the skin on your back. Tell your health care provider of new moles, moles that have irregular  borders, moles that are larger than a pencil eraser, or moles that have changed in shape or color.  Stay current with required vaccines (immunizations).  Influenza vaccine. All adults should be immunized every year.  Tetanus, diphtheria, and acellular pertussis (Td, Tdap) vaccine. Pregnant women should receive 1 dose of Tdap vaccine during each pregnancy. The dose should be obtained regardless of the length of time since the last dose. Immunization is preferred during the 27th-36th week of gestation. An adult who has not previously received Tdap or who does not know her vaccine status should receive 1 dose of Tdap. This initial dose should be followed by tetanus and diphtheria toxoids (Td) booster doses every 10 years. Adults with an unknown or incomplete history of completing a 3-dose immunization series with Td-containing vaccines should begin or complete a primary immunization series including a Tdap dose. Adults should receive a Td booster every 10 years.  Varicella vaccine. An adult without evidence of immunity to varicella should receive 2 doses or a second dose if she has previously received 1 dose. Pregnant females who do not have evidence of immunity should receive the first dose after pregnancy. This first dose should be obtained before leaving the health care facility. The second dose should be obtained 4-8 weeks after the first dose.  Human papillomavirus (HPV) vaccine. Females aged 13-26 years who have not received the vaccine previously should obtain the 3-dose series. The vaccine is not recommended for use in pregnant females. However, pregnancy testing is not needed before receiving a dose. If a female is found to be pregnant after receiving a dose, no treatment is needed. In that case, the remaining doses should be delayed until after the pregnancy. Immunization is recommended for any person with an immunocompromised condition through the age of 24 years if she did not get any or all doses  earlier. During the 3-dose series, the second dose should be obtained 4-8 weeks after the first dose. The third dose should be obtained 24 weeks after the first dose and 16 weeks after the second dose.  Zoster vaccine. One dose is recommended for adults aged 97 years or older unless certain conditions are present.  Measles, mumps, and rubella (MMR) vaccine. Adults born  before 1957 generally are considered immune to measles and mumps. Adults born in [birth year] or later should have 1 or more doses of MMR vaccine unless there is a contraindication to the vaccine or there is laboratory evidence of immunity to each of the three diseases. A routine second dose of MMR vaccine should be obtained at least 28 days after the first dose for students attending postsecondary schools, health care workers, or international travelers. People who received inactivated measles vaccine or an unknown type of measles vaccine during 1963-1967 should receive 2 doses of MMR vaccine. People who received inactivated mumps vaccine or an unknown type of mumps vaccine before 1979 and are at high risk for mumps infection should consider immunization with 2 doses of MMR vaccine. For females of childbearing age, rubella immunity should be determined. If there is no evidence of immunity, females who are not pregnant should be vaccinated. If there is no evidence of immunity, females who are pregnant should delay immunization until after pregnancy. Unvaccinated health care workers born before 60 who lack laboratory evidence of measles, mumps, or rubella immunity or laboratory confirmation of disease should consider measles and mumps immunization with 2 doses of MMR vaccine or rubella immunization with 1 dose of MMR vaccine.  Pneumococcal 13-valent conjugate (PCV13) vaccine. When indicated, a person who is uncertain of his immunization history and has no record of immunization should receive the PCV13 vaccine. All adults 61 years of age and older  should receive this vaccine. An adult aged 92 years or older who has certain medical conditions and has not been previously immunized should receive 1 dose of PCV13 vaccine. This PCV13 should be followed with a dose of pneumococcal polysaccharide (PPSV23) vaccine. Adults who are at high risk for pneumococcal disease should obtain the PPSV23 vaccine at least 8 weeks after the dose of PCV13 vaccine. Adults older than 31 years of age who have normal immune system function should obtain the PPSV23 vaccine dose at least 1 year after the dose of PCV13 vaccine.  Pneumococcal polysaccharide (PPSV23) vaccine. When PCV13 is also indicated, PCV13 should be obtained first. All adults aged 2 years and older should be immunized. An adult younger than age 30 years who has certain medical conditions should be immunized. Any person who resides in a nursing home or long-term care facility should be immunized. An adult smoker should be immunized. People with an immunocompromised condition and certain other conditions should receive both PCV13 and PPSV23 vaccines. People with human immunodeficiency virus (HIV) infection should be immunized as soon as possible after diagnosis. Immunization during chemotherapy or radiation therapy should be avoided. Routine use of PPSV23 vaccine is not recommended for American Indians, [place] Natives, or people younger than 65 years unless there are medical conditions that require PPSV23 vaccine. When indicated, people who have unknown immunization and have no record of immunization should receive PPSV23 vaccine. One-time revaccination 5 years after the first dose of PPSV23 is recommended for people aged 19-64 years who have chronic kidney failure, nephrotic syndrome, asplenia, or immunocompromised conditions. People who received 1-2 doses of PPSV23 before age 44 years should receive another dose of PPSV23 vaccine at age 83 years or later if at least 5 years have passed since the previous dose. Doses  of PPSV23 are not needed for people immunized with PPSV23 at or after age 20 years.  Meningococcal vaccine. Adults with asplenia or persistent complement component deficiencies should receive 2 doses of quadrivalent meningococcal conjugate (MenACWY-D) vaccine. The doses should be obtained  at least 2 months apart. Microbiologists working with certain meningococcal bacteria, [place] recruits, people at risk during an outbreak, and people who travel to or live in countries with a high rate of meningitis should be immunized. A first-year college student up through age 28 years who is living in a residence hall should receive a dose if she did not receive a dose on or after her 16th birthday. Adults who have certain high-risk conditions should receive one or more doses of vaccine.  Hepatitis A vaccine. Adults who wish to be protected from this disease, have certain high-risk conditions, work with hepatitis A-infected animals, work in hepatitis A research labs, or travel to or work in countries with a high rate of hepatitis A should be immunized. Adults who were previously unvaccinated and who anticipate close contact with an international adoptee during the first 60 days after arrival in the [place] from a country with a high rate of hepatitis A should be immunized.  Hepatitis B vaccine. Adults who wish to be protected from this disease, have certain high-risk conditions, may be exposed to blood or other infectious body fluids, are household contacts or sex partners of hepatitis B positive people, are clients or workers in certain care facilities, or travel to or work in countries with a high rate of hepatitis B should be immunized.  Haemophilus influenzae type b (Hib) vaccine. A previously unvaccinated person with asplenia or sickle cell disease or having a scheduled splenectomy should receive 1 dose of Hib vaccine. Regardless of previous immunization, a recipient of a hematopoietic stem cell transplant  should receive a 3-dose series 6-12 months after her successful transplant. Hib vaccine is not recommended for adults with HIV infection. Preventive Services / Frequency Ages 71 to 87 years  Blood pressure check.** / Every 3-5 years.  Lipid and cholesterol check.** / Every 5 years beginning at age 1.  Clinical breast exam.** / Every 3 years for women in their 3s and 31s.  BRCA-related cancer risk assessment.** / For women who have family members with a BRCA-related cancer (breast, ovarian, tubal, or peritoneal cancers).  Pap test.** / Every 2 years from ages 50 through 86. Every 3 years starting at age 87 through age 7 or 75 with a history of 3 consecutive normal Pap tests.  HPV screening.** / Every 3 years from ages 59 through ages 35 to 6 with a history of 3 consecutive normal Pap tests.  Hepatitis C blood test.** / For any individual with known risks for hepatitis C.  Skin self-exam. / Monthly.  Influenza vaccine. / Every year.  Tetanus, diphtheria, and acellular pertussis (Tdap, Td) vaccine.** / Consult your health care provider. Pregnant women should receive 1 dose of Tdap vaccine during each pregnancy. 1 dose of Td every 10 years.  Varicella vaccine.** / Consult your health care provider. Pregnant females who do not have evidence of immunity should receive the first dose after pregnancy.  HPV vaccine. / 3 doses over 6 months, if 72 and younger. The vaccine is not recommended for use in pregnant females. However, pregnancy testing is not needed before receiving a dose.  Measles, mumps, rubella (MMR) vaccine.** / You need at least 1 dose of MMR if you were born in 1957 or later. You may also need a 2nd dose. For females of childbearing age, rubella immunity should be determined. If there is no evidence of immunity, females who are not pregnant should be vaccinated. If there is no evidence of immunity, females who are  pregnant should delay immunization until after  pregnancy.  Pneumococcal 13-valent conjugate (PCV13) vaccine.** / Consult your health care provider.  Pneumococcal polysaccharide (PPSV23) vaccine.** / 1 to 2 doses if you smoke cigarettes or if you have certain conditions.  Meningococcal vaccine.** / 1 dose if you are age 87 to 44 years and a Market researcher living in a residence hall, or have one of several medical conditions, you need to get vaccinated against meningococcal disease. You may also need additional booster doses.  Hepatitis A vaccine.** / Consult your health care provider.  Hepatitis B vaccine.** / Consult your health care provider.  Haemophilus influenzae type b (Hib) vaccine.** / Consult your health care provider. Ages 86 to 38 years  Blood pressure check.** / Every year.  Lipid and cholesterol check.** / Every 5 years beginning at age 49 years.  Lung cancer screening. / Every year if you are aged 71-80 years and have a 30-pack-year history of smoking and currently smoke or have quit within the past 15 years. Yearly screening is stopped once you have quit smoking for at least 15 years or develop a health problem that would prevent you from having lung cancer treatment.  Clinical breast exam.** / Every year after age 51 years.  BRCA-related cancer risk assessment.** / For women who have family members with a BRCA-related cancer (breast, ovarian, tubal, or peritoneal cancers).  Mammogram.** / Every year beginning at age 18 years and continuing for as long as you are in good health. Consult with your health care provider.  Pap test.** / Every 3 years starting at age 63 years through age 37 or 57 years with a history of 3 consecutive normal Pap tests.  HPV screening.** / Every 3 years from ages 41 years through ages 76 to 23 years with a history of 3 consecutive normal Pap tests.  Fecal occult blood test (FOBT) of stool. / Every year beginning at age 36 years and continuing until age 51 years. You may not need  to do this test if you get a colonoscopy every 10 years.  Flexible sigmoidoscopy or colonoscopy.** / Every 5 years for a flexible sigmoidoscopy or every 10 years for a colonoscopy beginning at age 36 years and continuing until age 35 years.  Hepatitis C blood test.** / For all people born from [birth year] through 1965 and any individual with known risks for hepatitis C.  Skin self-exam. / Monthly.  Influenza vaccine. / Every year.  Tetanus, diphtheria, and acellular pertussis (Tdap/Td) vaccine.** / Consult your health care provider. Pregnant women should receive 1 dose of Tdap vaccine during each pregnancy. 1 dose of Td every 10 years.  Varicella vaccine.** / Consult your health care provider. Pregnant females who do not have evidence of immunity should receive the first dose after pregnancy.  Zoster vaccine.** / 1 dose for adults aged 73 years or older.  Measles, mumps, rubella (MMR) vaccine.** / You need at least 1 dose of MMR if you were born in 1957 or later. You may also need a second dose. For females of childbearing age, rubella immunity should be determined. If there is no evidence of immunity, females who are not pregnant should be vaccinated. If there is no evidence of immunity, females who are pregnant should delay immunization until after pregnancy.  Pneumococcal 13-valent conjugate (PCV13) vaccine.** / Consult your health care provider.  Pneumococcal polysaccharide (PPSV23) vaccine.** / 1 to 2 doses if you smoke cigarettes or if you have certain conditions.  Meningococcal vaccine.** /  Consult your health care provider.  Hepatitis A vaccine.** / Consult your health care provider.  Hepatitis B vaccine.** / Consult your health care provider.  Haemophilus influenzae type b (Hib) vaccine.** / Consult your health care provider. Ages 80 years and over  Blood pressure check.** / Every year.  Lipid and cholesterol check.** / Every 5 years beginning at age 62 years.  Lung cancer  screening. / Every year if you are aged 32-80 years and have a 30-pack-year history of smoking and currently smoke or have quit within the past 15 years. Yearly screening is stopped once you have quit smoking for at least 15 years or develop a health problem that would prevent you from having lung cancer treatment.  Clinical breast exam.** / Every year after age 61 years.  BRCA-related cancer risk assessment.** / For women who have family members with a BRCA-related cancer (breast, ovarian, tubal, or peritoneal cancers).  Mammogram.** / Every year beginning at age 39 years and continuing for as long as you are in good health. Consult with your health care provider.  Pap test.** / Every 3 years starting at age 85 years through age 74 or 72 years with 3 consecutive normal Pap tests. Testing can be stopped between 65 and 70 years with 3 consecutive normal Pap tests and no abnormal Pap or HPV tests in the past 10 years.  HPV screening.** / Every 3 years from ages 55 years through ages 67 or 77 years with a history of 3 consecutive normal Pap tests. Testing can be stopped between 65 and 70 years with 3 consecutive normal Pap tests and no abnormal Pap or HPV tests in the past 10 years.  Fecal occult blood test (FOBT) of stool. / Every year beginning at age 81 years and continuing until age 22 years. You may not need to do this test if you get a colonoscopy every 10 years.  Flexible sigmoidoscopy or colonoscopy.** / Every 5 years for a flexible sigmoidoscopy or every 10 years for a colonoscopy beginning at age 67 years and continuing until age 22 years.  Hepatitis C blood test.** / For all people born from [birth year] through 1966 and any individual with known risks for hepatitis C.  Osteoporosis screening.** / A one-time screening for women ages 8 years and over and women at risk for fractures or osteoporosis.  Skin self-exam. / Monthly.  Influenza vaccine. / Every year.  Tetanus, diphtheria, and  acellular pertussis (Tdap/Td) vaccine.** / 1 dose of Td every 10 years.  Varicella vaccine.** / Consult your health care provider.  Zoster vaccine.** / 1 dose for adults aged 56 years or older.  Pneumococcal 13-valent conjugate (PCV13) vaccine.** / Consult your health care provider.  Pneumococcal polysaccharide (PPSV23) vaccine.** / 1 dose for all adults aged 15 years and older.  Meningococcal vaccine.** / Consult your health care provider.  Hepatitis A vaccine.** / Consult your health care provider.  Hepatitis B vaccine.** / Consult your health care provider.  Haemophilus influenzae type b (Hib) vaccine.** / Consult your health care provider. ** Family history and personal history of risk and conditions may change your health care provider's recommendations.   This information is not intended to replace advice given to you by your health care provider. Make sure you discuss any questions you have with your health care provider.   Document Released: 02/12/2002 Document Revised: 01/07/2015 Document Reviewed: 05/14/2011 Elsevier Interactive Patient Education [employer].

## 2016-11-02 ENCOUNTER — Other Ambulatory Visit: Payer: Self-pay | Admitting: Obstetrics and Gynecology

## 2016-11-02 DIAGNOSIS — E559 Vitamin D deficiency, unspecified: Secondary | ICD-10-CM

## 2016-11-02 LAB — COMPREHENSIVE METABOLIC PANEL
A/G RATIO: 2.2 (ref 1.2–2.2)
ALBUMIN: 4.7 g/dL (ref 3.5–5.5)
ALK PHOS: 44 IU/L (ref 39–117)
ALT: 10 IU/L (ref 0–32)
AST: 18 IU/L (ref 0–40)
BILIRUBIN TOTAL: 0.5 mg/dL (ref 0.0–1.2)
BUN / CREAT RATIO: 16 (ref 9–23)
BUN: 12 mg/dL (ref 6–20)
CHLORIDE: 100 mmol/L (ref 96–106)
CO2: 24 mmol/L (ref 18–29)
Calcium: 9.2 mg/dL (ref 8.7–10.2)
Creatinine, Ser: 0.74 mg/dL (ref 0.57–1.00)
GFR calc non Af Amer: 108 mL/min/{1.73_m2} (ref >59)
GFR, EST AFRICAN AMERICAN: 125 mL/min/{1.73_m2} (ref >59)
GLUCOSE: 77 mg/dL (ref 65–99)
Globulin, Total: 2.1 g/dL (ref 1.5–4.5)
POTASSIUM: 4 mmol/L (ref 3.5–5.2)
Sodium: 139 mmol/L (ref 134–144)
TOTAL PROTEIN: 6.8 g/dL (ref 6.0–8.5)

## 2016-11-02 LAB — THYROID PANEL WITH TSH
FREE THYROXINE INDEX: 1.5 (ref 1.2–4.9)
T3 Uptake Ratio: 28 % (ref 24–39)
T4 TOTAL: 5.4 ug/dL (ref 4.5–12.0)
TSH: 2.14 u[IU]/mL (ref 0.450–4.500)

## 2016-11-02 LAB — CBC
HEMOGLOBIN: 12.8 g/dL (ref 11.1–15.9)
Hematocrit: 37.3 % (ref 34.0–46.6)
MCH: 28.4 pg (ref 26.6–33.0)
MCHC: 34.3 g/dL (ref 31.5–35.7)
MCV: 83 fL (ref 79–97)
PLATELETS: 233 10*3/uL (ref 150–379)
RBC: 4.5 x10E6/uL (ref 3.77–5.28)
RDW: 13.9 % (ref 12.3–15.4)
WBC: 5.2 10*3/uL (ref 3.4–10.8)

## 2016-11-02 LAB — VITAMIN D 25 HYDROXY (VIT D DEFICIENCY, FRACTURES): VIT D 25 HYDROXY: 26.7 ng/mL — AB (ref 30.0–100.0)

## 2016-11-02 LAB — LIPID PANEL
CHOLESTEROL TOTAL: 179 mg/dL (ref 100–199)
Chol/HDL Ratio: 4 ratio units (ref 0.0–4.4)
HDL: 45 mg/dL (ref >39)
LDL Calculated: 93 mg/dL (ref 0–99)
Triglycerides: 204 mg/dL — ABNORMAL HIGH (ref 0–149)
VLDL Cholesterol Cal: 41 mg/dL — ABNORMAL HIGH (ref 5–40)

## 2016-11-02 MED ORDER — VITAMIN D (ERGOCALCIFEROL) 1.25 MG (50000 UNIT) PO CAPS: 50000.0000 [IU] | ORAL_CAPSULE | ORAL | 1 refills | Status: DC

## 2016-11-05 ENCOUNTER — Telehealth: Payer: Self-pay | Admitting: *Deleted

## 2016-11-05 NOTE — Telephone Encounter (Signed)
-----   Message from Purcell NailsMelody N Shambley, [place] sent at 11/02/2016  5:41 PM EDT ----- Please mail info on vit D defd.

## 2016-11-05 NOTE — Telephone Encounter (Signed)
Mailed pt info on Vit D

## 2016-11-21 ENCOUNTER — Encounter: Payer: Self-pay | Admitting: Physician Assistant

## 2016-11-30 DIAGNOSIS — J301 Allergic rhinitis due to pollen: Secondary | ICD-10-CM | POA: Diagnosis not present

## 2016-12-05 DIAGNOSIS — J301 Allergic rhinitis due to pollen: Secondary | ICD-10-CM | POA: Diagnosis not present

## 2017-03-08 DIAGNOSIS — J301 Allergic rhinitis due to pollen: Secondary | ICD-10-CM | POA: Diagnosis not present

## 2017-03-13 DIAGNOSIS — J301 Allergic rhinitis due to pollen: Secondary | ICD-10-CM | POA: Diagnosis not present

## 2017-05-09 ENCOUNTER — Encounter: Payer: Self-pay | Admitting: Physician Assistant

## 2017-05-09 ENCOUNTER — Ambulatory Visit: Payer: Self-pay | Admitting: Physician Assistant

## 2017-05-09 VITALS — BP 90/64 | HR 80 | Temp 98.4°F

## 2017-05-09 DIAGNOSIS — J0100 Acute maxillary sinusitis, unspecified: Secondary | ICD-10-CM

## 2017-05-09 MED ORDER — FLUCONAZOLE 150 MG PO TABS: 150.0000 mg | ORAL_TABLET | Freq: Once | ORAL | 0 refills | Status: AC

## 2017-05-09 MED ORDER — AMOXICILLIN-POT CLAVULANATE 875-125 MG PO TABS: 1.0000 | ORAL_TABLET | Freq: Two times a day (BID) | ORAL | 0 refills | Status: DC

## 2017-05-09 MED ORDER — PREDNISONE 10 MG PO TABS: 30.0000 mg | ORAL_TABLET | Freq: Every day | ORAL | 0 refills | Status: DC

## 2017-05-09 NOTE — Progress Notes (Signed)
S: C/o runny nose and congestion for 7 days, no fever, chills, cp/sob, v/d; mucus is green and thick, cough is sporadic, c/o of facial and dental pain. A lot of pressure when bending forward, is on allergy injections  Using otc meds:   O: PE: vitals wnl, nad,  perrl eomi, normocephalic, tms dull, nasal mucosa red and swollen, throat injected, neck supple no lymph, lungs c t a, cv rrr, neuro intact  A:  Acute sinusitis   P: drink fluids, continue regular meds , use otc meds of choice, return if not improving in 5 days, return earlier if worsening , augmentin, diflucan, pred 30mg  qd x 3d

## 2017-05-15 ENCOUNTER — Telehealth: Payer: Self-pay | Admitting: Physician Assistant

## 2017-05-15 NOTE — Telephone Encounter (Signed)
Contacted patient and informed her of recommendation. She stated that mucus is not green just a little yellow and has been doing saline rinse and sudafed. She also stated that she thinks it maybe  allergy. I    instructed her if any worse we can schedule her to come back and be seen. Patient acknowledge understanding

## 2017-05-15 NOTE — Telephone Encounter (Signed)
Need to recheck, try otc saline nasal wash, sudafed, if mucus is still green need to see her in the clinic

## 2017-06-28 DIAGNOSIS — J301 Allergic rhinitis due to pollen: Secondary | ICD-10-CM | POA: Diagnosis not present

## 2017-07-01 DIAGNOSIS — J301 Allergic rhinitis due to pollen: Secondary | ICD-10-CM | POA: Diagnosis not present

## 2017-10-18 DIAGNOSIS — J301 Allergic rhinitis due to pollen: Secondary | ICD-10-CM | POA: Diagnosis not present

## 2017-10-21 DIAGNOSIS — J301 Allergic rhinitis due to pollen: Secondary | ICD-10-CM | POA: Diagnosis not present

## 2017-10-23 DIAGNOSIS — R42 Dizziness and giddiness: Secondary | ICD-10-CM | POA: Diagnosis not present

## 2017-10-23 DIAGNOSIS — J301 Allergic rhinitis due to pollen: Secondary | ICD-10-CM | POA: Diagnosis not present

## 2017-11-06 ENCOUNTER — Ambulatory Visit (INDEPENDENT_AMBULATORY_CARE_PROVIDER_SITE_OTHER): Payer: 59 | Admitting: Obstetrics and Gynecology

## 2017-11-06 ENCOUNTER — Encounter: Payer: Self-pay | Admitting: Obstetrics and Gynecology

## 2017-11-06 VITALS — BP 116/78 | HR 98 | Ht 62.0 in | Wt 125.0 lb

## 2017-11-06 DIAGNOSIS — Z01419 Encounter for gynecological examination (general) (routine) without abnormal findings: Secondary | ICD-10-CM | POA: Diagnosis not present

## 2017-11-06 DIAGNOSIS — E559 Vitamin D deficiency, unspecified: Secondary | ICD-10-CM | POA: Diagnosis not present

## 2017-11-06 MED ORDER — LORATADINE 10 MG PO TABS: 10.0000 mg | ORAL_TABLET | Freq: Every day | ORAL | 4 refills | Status: DC

## 2017-11-06 MED ORDER — CETIRIZINE HCL 10 MG PO TABS: 10.0000 mg | ORAL_TABLET | Freq: Every day | ORAL | 4 refills | Status: AC

## 2017-11-06 MED ORDER — MONTELUKAST SODIUM 10 MG PO TABS: 10.0000 mg | ORAL_TABLET | Freq: Every day | ORAL | 6 refills | Status: DC

## 2017-11-06 NOTE — Progress Notes (Signed)
Subjective:   Brandi Johnson is a 31 y.o. 482P2002 Caucasian female here for a routine well-woman exam.  No LMP recorded. Patient is not currently having periods (Reason: IUD).    Current complaints: none, needs meds refilled PCP: Tullo       Does need labs  Social History: Sexual: heterosexual Marital Status: married Living situation: with family Occupation: [place] [employer] Tobacco/alcohol: no tobacco use Illicit drugs: no history of illicit drug use  The following portions of the patient's history were reviewed and updated as appropriate: allergies, current medications, past family history, past medical history, past social history, past surgical history and problem list.  Past Medical History Past Medical History:  Diagnosis Date  . AR (allergic rhinitis)   . Hemorrhoids in pregnancy   . History of renal calculi    with hydroneprosis noted during pregnancy  . Hydronephrosis   . Motion sickness   . UTI (lower urinary tract infection)     Past Surgical History Past Surgical History:  Procedure Laterality Date  . CESAREAN SECTION  2012    Gynecologic History [id number]  No LMP recorded. Patient is not currently having periods (Reason: IUD). Contraception: IUD Last Pap: 2016. Results were: normal   Obstetric History OB History  Gravida Para Term Preterm AB Living  2 2 2     2   SAB TAB Ectopic Multiple Live Births          2    # Outcome Date GA Lbr Len/2nd Weight Sex Delivery Anes PTL Lv  2 Term 2015   7 lb 6.4 oz (3.357 kg) F CS-LTranv   LIV  1 Term 2012   8 lb 1.6 oz (3.674 kg) M CS-LTranv   LIV      Current Medications Current Outpatient Medications on File Prior to Visit  Medication Sig Dispense Refill  . fluticasone (FLONASE) 50 MCG/ACT nasal spray Place 2 sprays into both nostrils daily. 16 g 6  . levonorgestrel (MIRENA) 20 MCG/24HR IUD 1 each by Intrauterine route once.    . montelukast (SINGULAIR) 10 MG tablet Take 1 tablet (10 mg total) by mouth at  bedtime. 30 tablet 6  . Multiple Vitamin (MULTIVITAMIN) tablet Take 1 tablet by mouth daily.    Marland Kitchen. amoxicillin-clavulanate (AUGMENTIN) 875-125 MG tablet Take 1 tablet by mouth 2 (two) times daily. (Patient not taking: Reported on 11/06/2017) 20 tablet 0  . loratadine (CLARITIN) 10 MG tablet Take 10 mg by mouth daily.    . methylPREDNISolone (MEDROL DOSEPAK) 4 MG TBPK tablet Use as directed (Patient not taking: Reported on 11/01/2016) 21 tablet 0  . predniSONE (DELTASONE) 10 MG tablet Take 3 tablets (30 mg total) by mouth daily with breakfast. (Patient not taking: Reported on 11/06/2017) 9 tablet 0   No current facility-administered medications on file prior to visit.     Review of Systems Patient denies any headaches, blurred vision, shortness of breath, chest pain, abdominal pain, problems with bowel movements, urination, or intercourse.  Objective:  BP 116/78   Pulse 98   Ht 5\' 2"  (1.575 m)   Wt 125 lb (56.7 kg)   BMI 22.86 kg/m  Physical Exam  General:  Well developed, well nourished, no acute distress. She is alert and oriented x3. Skin:  Warm and dry Neck:  Midline trachea, no thyromegaly or nodules Cardiovascular: Regular rate and rhythm, no murmur heard Lungs:  Effort normal, all lung fields clear to auscultation bilaterally Breasts:  No dominant palpable mass, retraction, or nipple discharge  Abdomen:  Soft, non tender, no hepatosplenomegaly or masses Pelvic:  External genitalia is normal in appearance.  The vagina is normal in appearance. The cervix is bulbous, no CMT.  Thin prep pap is not done  Uterus is felt to be normal size, shape, and contour.  No adnexal masses or tenderness noted. IUD string noted. Extremities:  No swelling or varicosities noted Psych:  She has a normal mood and affect  Assessment:   Healthy well-woman exam IUD check Vitamin d deficiency  Plan:  Labs obtained-will follow up accordingly F/U 1 year for AE, or sooner if needed   Samanvitha [place] Suzan NailerN Desire Fulp,  CNM

## 2017-11-07 LAB — COMPREHENSIVE METABOLIC PANEL
A/G RATIO: 2.1 (ref 1.2–2.2)
ALT: 17 IU/L (ref 0–32)
AST: 22 IU/L (ref 0–40)
Albumin: 4.9 g/dL (ref 3.5–5.5)
Alkaline Phosphatase: 41 IU/L (ref 39–117)
BILIRUBIN TOTAL: 0.4 mg/dL (ref 0.0–1.2)
BUN/Creatinine Ratio: 18 (ref 9–23)
BUN: 14 mg/dL (ref 6–20)
CALCIUM: 9.6 mg/dL (ref 8.7–10.2)
CHLORIDE: 99 mmol/L (ref 96–106)
CO2: 27 mmol/L (ref 20–29)
Creatinine, Ser: 0.76 mg/dL (ref 0.57–1.00)
GFR calc Af Amer: 120 mL/min/{1.73_m2} (ref >59)
GFR calc non Af Amer: 104 mL/min/{1.73_m2} (ref >59)
GLOBULIN, TOTAL: 2.3 g/dL (ref 1.5–4.5)
Glucose: 90 mg/dL (ref 65–99)
POTASSIUM: 4.4 mmol/L (ref 3.5–5.2)
SODIUM: 141 mmol/L (ref 134–144)
Total Protein: 7.2 g/dL (ref 6.0–8.5)

## 2017-11-07 LAB — VITAMIN D 25 HYDROXY (VIT D DEFICIENCY, FRACTURES): Vit D, 25-Hydroxy: 36.9 ng/mL (ref 30.0–100.0)

## 2017-11-07 LAB — LIPID PANEL
CHOLESTEROL TOTAL: 186 mg/dL (ref 100–199)
Chol/HDL Ratio: 3.4 ratio (ref 0.0–4.4)
HDL: 54 mg/dL (ref >39)
LDL CALC: 107 mg/dL — AB (ref 0–99)
TRIGLYCERIDES: 124 mg/dL (ref 0–149)
VLDL CHOLESTEROL CAL: 25 mg/dL (ref 5–40)

## 2017-12-27 DIAGNOSIS — J301 Allergic rhinitis due to pollen: Secondary | ICD-10-CM | POA: Diagnosis not present

## 2018-01-01 DIAGNOSIS — J301 Allergic rhinitis due to pollen: Secondary | ICD-10-CM | POA: Diagnosis not present

## 2018-01-02 ENCOUNTER — Encounter: Payer: Self-pay | Admitting: Internal Medicine

## 2018-01-06 ENCOUNTER — Encounter: Payer: Self-pay | Admitting: Internal Medicine

## 2018-05-09 DIAGNOSIS — J301 Allergic rhinitis due to pollen: Secondary | ICD-10-CM | POA: Diagnosis not present

## 2018-05-12 DIAGNOSIS — J301 Allergic rhinitis due to pollen: Secondary | ICD-10-CM | POA: Diagnosis not present

## 2018-09-19 DIAGNOSIS — J301 Allergic rhinitis due to pollen: Secondary | ICD-10-CM | POA: Diagnosis not present

## 2018-09-22 DIAGNOSIS — J301 Allergic rhinitis due to pollen: Secondary | ICD-10-CM | POA: Diagnosis not present

## 2018-11-07 ENCOUNTER — Encounter: Payer: 59 | Admitting: Obstetrics and Gynecology

## 2018-11-18 ENCOUNTER — Other Ambulatory Visit (HOSPITAL_COMMUNITY)
Admission: RE | Admit: 2018-11-18 | Discharge: 2018-11-18 | Disposition: A | Discharge status: Home / Self Care | Payer: 59 | Source: Ambulatory Visit | Attending: Obstetrics and Gynecology | Admitting: Obstetrics and Gynecology

## 2018-11-18 ENCOUNTER — Encounter: Payer: Self-pay | Admitting: Obstetrics and Gynecology

## 2018-11-18 ENCOUNTER — Ambulatory Visit (INDEPENDENT_AMBULATORY_CARE_PROVIDER_SITE_OTHER): Payer: 59 | Admitting: Obstetrics and Gynecology

## 2018-11-18 VITALS — BP 108/77 | HR 87 | Ht 61.5 in | Wt 125.5 lb

## 2018-11-18 DIAGNOSIS — Z01419 Encounter for gynecological examination (general) (routine) without abnormal findings: Secondary | ICD-10-CM | POA: Diagnosis not present

## 2018-11-18 NOTE — Patient Instructions (Signed)
Preventive Care 18-39 Years, Female Preventive care refers to lifestyle choices and visits with your health care provider that can promote health and wellness. What does preventive care include?  A yearly physical exam. This is also called an annual well check.  Dental exams once or twice a year.  Routine eye exams. Ask your health care provider how often you should have your eyes checked.  Personal lifestyle choices, including: ? Daily care of your teeth and gums. ? Regular physical activity. ? Eating a healthy diet. ? Avoiding tobacco and drug use. ? Limiting alcohol use. ? Practicing safe sex. ? Taking vitamin and mineral supplements as recommended by your health care provider. What happens during an annual well check? The services and screenings done by your health care provider during your annual well check will depend on your age, overall health, lifestyle risk factors, and family history of disease. Counseling Your health care provider may ask you questions about your:  Alcohol use.  Tobacco use.  Drug use.  Emotional well-being.  Home and relationship well-being.  Sexual activity.  Eating habits.  Work and work Statistician.  Method of birth control.  Menstrual cycle.  Pregnancy history.  Screening You may have the following tests or measurements:  Height, weight, and BMI.  Diabetes screening. This is done by checking your blood sugar (glucose) after you have not eaten for a while (fasting).  Blood pressure.  Lipid and cholesterol levels. These may be checked every 5 years starting at age 38.  Skin check.  Hepatitis C blood test.  Hepatitis B blood test.  Sexually transmitted disease (STD) testing.  BRCA-related cancer screening. This may be done if you have a family history of breast, ovarian, tubal, or peritoneal cancers.  Pelvic exam and Pap test. This may be done every 3 years starting at age 38. Starting at age 30, this may be done  every 5 years if you have a Pap test in combination with an HPV test.  Discuss your test results, treatment options, and if necessary, the need for more tests with your health care provider. Vaccines Your health care provider may recommend certain vaccines, such as:  Influenza vaccine. This is recommended every year.  Tetanus, diphtheria, and acellular pertussis (Tdap, Td) vaccine. You may need a Td booster every 10 years.  Varicella vaccine. You may need this if you have not been vaccinated.  HPV vaccine. If you are 39 or younger, you may need three doses over 6 months.  Measles, mumps, and rubella (MMR) vaccine. You may need at least one dose of MMR. You may also need a second dose.  Pneumococcal 13-valent conjugate (PCV13) vaccine. You may need this if you have certain conditions and were not previously vaccinated.  Pneumococcal polysaccharide (PPSV23) vaccine. You may need one or two doses if you smoke cigarettes or if you have certain conditions.  Meningococcal vaccine. One dose is recommended if you are age 68-21 years and a first-year college student living in a residence hall, or if you have one of several medical conditions. You may also need additional booster doses.  Hepatitis A vaccine. You may need this if you have certain conditions or if you travel or work in places where you may be exposed to hepatitis A.  Hepatitis B vaccine. You may need this if you have certain conditions or if you travel or work in places where you may be exposed to hepatitis B.  Haemophilus influenzae type b (Hib) vaccine. You may need this  if you have certain risk factors.  Talk to your health care provider about which screenings and vaccines you need and how often you need them. This information is not intended to replace advice given to you by your health care provider. Make sure you discuss any questions you have with your health care provider. Document Released: 02/12/2002 Document Revised:  09/05/2016 Document Reviewed: 10/18/2015 Elsevier Interactive Patient Education  2018 Elsevier Inc.  

## 2018-11-18 NOTE — Progress Notes (Signed)
Subjective:   Brandi Johnson is a 32 y.o. G72P2002 Caucasian female here for a routine well-woman exam.  No LMP recorded. (Menstrual status: IUD).    Current complaints: none PCP: Tullo       does desire labs  Social History: Sexual: heterosexual Marital Status: married Living situation: with family Occupation: works at [employer] Tobacco/alcohol: no tobacco use Illicit drugs: no history of illicit drug use  The following portions of the patient's history were reviewed and updated as appropriate: allergies, current medications, past family history, past medical history, past social history, past surgical history and problem list.  Past Medical History Past Medical History:  Diagnosis Date  . AR (allergic rhinitis)   . Hemorrhoids in pregnancy   . History of renal calculi    with hydroneprosis noted during pregnancy  . Hydronephrosis   . Motion sickness   . UTI (lower urinary tract infection)     Past Surgical History Past Surgical History:  Procedure Laterality Date  . CESAREAN SECTION  2012    Gynecologic History [id number]  No LMP recorded. (Menstrual status: IUD). Contraception: IUD Last Pap: 2016. Results were: normal   Obstetric History OB History  Gravida Para Term Preterm AB Living  2 2 2     2   SAB TAB Ectopic Multiple Live Births          2    # Outcome Date GA Lbr Len/2nd Weight Sex Delivery Anes PTL Lv  2 Term 2015   7 lb 6.4 oz (3.357 kg) F CS-LTranv   LIV  1 Term 2012   8 lb 1.6 oz (3.674 kg) M CS-LTranv   LIV    Current Medications Current Outpatient Medications on File Prior to Visit  Medication Sig Dispense Refill  . Azelastine-Fluticasone (DYMISTA) 137-50 MCG/ACT SUSP Place into the nose.    . Biotin 10 MG CAPS Take by mouth.    . cetirizine (ZYRTEC) 10 MG tablet Take 1 tablet (10 mg total) daily by mouth. 90 tablet 4  . levonorgestrel (MIRENA) 20 MCG/24HR IUD 1 each by Intrauterine route once.    . montelukast (SINGULAIR) 10 MG tablet Take 1  tablet (10 mg total) at bedtime by mouth. 30 tablet 6  . amoxicillin-clavulanate (AUGMENTIN) 875-125 MG tablet Take 1 tablet by mouth 2 (two) times daily. (Patient not taking: Reported on 11/06/2017) 20 tablet 0  . loratadine (CLARITIN) 10 MG tablet Take 1 tablet (10 mg total) daily by mouth. (Patient not taking: Reported on 11/18/2018) 120 tablet 4  . methylPREDNISolone (MEDROL DOSEPAK) 4 MG TBPK tablet Use as directed (Patient not taking: Reported on 11/01/2016) 21 tablet 0  . Multiple Vitamin (MULTIVITAMIN) tablet Take 1 tablet by mouth daily.     No current facility-administered medications on file prior to visit.     Review of Systems Patient denies any headaches, blurred vision, shortness of breath, chest pain, abdominal pain, problems with bowel movements, urination, or intercourse.  Objective:  BP 108/77   Pulse 87   Ht 5' 1.5" (1.562 m)   Wt 125 lb 8 oz (56.9 kg)   BMI 23.33 kg/m  Physical Exam  General:  Well developed, well nourished, no acute distress. She is alert and oriented x3. Skin:  Warm and dry Neck:  Midline trachea, no thyromegaly or nodules Cardiovascular: Regular rate and rhythm, no murmur heard Lungs:  Effort normal, all lung fields clear to auscultation bilaterally Breasts:  No dominant palpable mass, retraction, or nipple discharge Abdomen:  Soft, non tender,  no hepatosplenomegaly or masses Pelvic:  External genitalia is normal in appearance.  The vagina is normal in appearance. The cervix is bulbous, no CMT.  Thin prep pap is done with HR HPV cotesting. Uterus is felt to be normal size, shape, and contour.  No adnexal masses or tenderness noted. Extremities:  No swelling or varicosities noted Psych:  She has a normal mood and affect  Assessment:   Healthy well-woman exam IUD check (placed Oct 2015)  Plan:  Labs obtained-will follow up accordingly F/U 1 year for AE, or sooner if needed   Melody Suzan NailerN Shambley, CNM

## 2018-11-19 LAB — LIPID PANEL
CHOLESTEROL TOTAL: 176 mg/dL (ref 100–199)
Chol/HDL Ratio: 3 ratio (ref 0.0–4.4)
HDL: 58 mg/dL (ref >39)
LDL CALC: 102 mg/dL — AB (ref 0–99)
TRIGLYCERIDES: 78 mg/dL (ref 0–149)
VLDL Cholesterol Cal: 16 mg/dL (ref 5–40)

## 2018-11-19 LAB — COMPREHENSIVE METABOLIC PANEL
A/G RATIO: 2.4 — AB (ref 1.2–2.2)
ALBUMIN: 4.7 g/dL (ref 3.5–5.5)
ALT: 22 IU/L (ref 0–32)
AST: 27 IU/L (ref 0–40)
Alkaline Phosphatase: 46 IU/L (ref 39–117)
BUN / CREAT RATIO: 20 (ref 9–23)
BUN: 13 mg/dL (ref 6–20)
Bilirubin Total: 0.3 mg/dL (ref 0.0–1.2)
CO2: 22 mmol/L (ref 20–29)
CREATININE: 0.66 mg/dL (ref 0.57–1.00)
Calcium: 9.4 mg/dL (ref 8.7–10.2)
Chloride: 100 mmol/L (ref 96–106)
GFR calc Af Amer: 134 mL/min/{1.73_m2} (ref >59)
GFR calc non Af Amer: 116 mL/min/{1.73_m2} (ref >59)
GLOBULIN, TOTAL: 2 g/dL (ref 1.5–4.5)
Glucose: 87 mg/dL (ref 65–99)
POTASSIUM: 3.9 mmol/L (ref 3.5–5.2)
SODIUM: 137 mmol/L (ref 134–144)
Total Protein: 6.7 g/dL (ref 6.0–8.5)

## 2018-11-19 LAB — VITAMIN D 25 HYDROXY (VIT D DEFICIENCY, FRACTURES): VIT D 25 HYDROXY: 30.5 ng/mL (ref 30.0–100.0)

## 2018-11-21 LAB — CYTOLOGY - PAP
Diagnosis: NEGATIVE
HPV: NOT DETECTED

## 2019-01-05 ENCOUNTER — Ambulatory Visit (INDEPENDENT_AMBULATORY_CARE_PROVIDER_SITE_OTHER): Payer: No Typology Code available for payment source | Admitting: Internal Medicine

## 2019-01-05 ENCOUNTER — Encounter: Payer: Self-pay | Admitting: Internal Medicine

## 2019-01-05 VITALS — BP 102/68 | HR 71 | Temp 98.3°F | Resp 14 | Ht 61.5 in | Wt 130.8 lb

## 2019-01-05 DIAGNOSIS — Z1283 Encounter for screening for malignant neoplasm of skin: Secondary | ICD-10-CM | POA: Diagnosis not present

## 2019-01-05 DIAGNOSIS — J329 Chronic sinusitis, unspecified: Secondary | ICD-10-CM | POA: Diagnosis not present

## 2019-01-05 DIAGNOSIS — J309 Allergic rhinitis, unspecified: Secondary | ICD-10-CM

## 2019-01-05 DIAGNOSIS — Z0100 Encounter for examination of eyes and vision without abnormal findings: Secondary | ICD-10-CM

## 2019-01-05 DIAGNOSIS — Z23 Encounter for immunization: Secondary | ICD-10-CM

## 2019-01-05 DIAGNOSIS — R3912 Poor urinary stream: Secondary | ICD-10-CM | POA: Insufficient documentation

## 2019-01-05 DIAGNOSIS — L989 Disorder of the skin and subcutaneous tissue, unspecified: Secondary | ICD-10-CM | POA: Insufficient documentation

## 2019-01-05 MED ORDER — MONTELUKAST SODIUM 10 MG PO TABS: 10.0000 mg | ORAL_TABLET | Freq: Every day | ORAL | 3 refills | Status: DC

## 2019-01-05 NOTE — Patient Instructions (Addendum)
Good to see you!  EAT MORE FISH:    Brandi Johnson is a wonderful white fish  frozen individually wrapped at [employer]   Your urinary stream may be affected by antihistamines and leUkotriene inhibitors.  So suspend ZYRTEC AND SINGUAIR  for one week   If no change,  PVR is next step   Colace (docusate) is available 100 mg can take 2 nightly if needed as stool softener   zryrtec  is cetirizine [facility]  Has year supply for $15  2000 IUs of D3 daily  Is safe to do year round

## 2019-01-05 NOTE — Progress Notes (Signed)
Subjective:  Patient ID: Brandi Johnson, female    DOB: Jul 29, 1986  Age: 33 y.o. MRN: [id number]  CC: The primary encounter diagnosis was Allergic rhinitis, unspecified seasonality, unspecified trigger. Diagnoses of Skin cancer screening, Vision test, Recurrent sinus infections, Weak urinary stream, and Skin lesion of back were also pertinent to this visit.  HPI Brandi Johnson presents for routine follow up.  Last seen April 2017.  She has multiple minor issues she would like to discuss  She h  sused public restrooms on occasion and has noticed that compared to other women she  has a weak urinary stream.  She has a maternal history of ureteral stenosis requiring dilation and worries about her own destiny.   Needing dermatology referral. Wants to see Lucy AntiguaArin Isenstein   [facility] to continue receiving parenteral allergic desensitizaion   Needs referral to [facility] for annual exam  Had an episode of blood in stool. Several months ago. None since  Stool was hard.     Exercising reglularly .  Averages  7 to 8 hours per night of sleep,     occasional constipation        Outpatient Medications Prior to Visit  Medication Sig Dispense Refill  . Azelastine-Fluticasone (DYMISTA) 137-50 MCG/ACT SUSP Place into the nose.    . Biotin 10 MG CAPS Take by mouth.    . cetirizine (ZYRTEC) 10 MG tablet Take 1 tablet (10 mg total) daily by mouth. 90 tablet 4  . levonorgestrel (MIRENA) 20 MCG/24HR IUD 1 each by Intrauterine route once.    . loratadine (CLARITIN) 10 MG tablet Take 1 tablet (10 mg total) daily by mouth. 120 tablet 4  . Multiple Vitamin (MULTIVITAMIN) tablet Take 1 tablet by mouth daily.    . montelukast (SINGULAIR) 10 MG tablet Take 1 tablet (10 mg total) at bedtime by mouth. 30 tablet 6  . amoxicillin-clavulanate (AUGMENTIN) 875-125 MG tablet Take 1 tablet by mouth 2 (two) times daily. (Patient not taking: Reported on 11/06/2017) 20 tablet 0  .  methylPREDNISolone (MEDROL DOSEPAK) 4 MG TBPK tablet Use as directed (Patient not taking: Reported on 11/01/2016) 21 tablet 0   No facility-administered medications prior to visit.     Review of Systems;  Patient denies headache, fevers, malaise, unintentional weight loss, skin rash, eye pain, sinus congestion and sinus pain, sore throat, dysphagia,  hemoptysis , cough, dyspnea, wheezing, chest pain, palpitations, orthopnea, edema, abdominal pain, nausea, melena, diarrhea, constipation, flank pain, dysuria, hematuria, urinary  Frequency, nocturia, numbness, tingling, seizures,  Focal weakness, Loss of consciousness,  Tremor, insomnia, depression, anxiety, and suicidal ideation.      Objective:  BP 102/68 (BP Location: Left Arm, Patient Position: Sitting, Cuff Size: Normal)   Pulse 71   Temp 98.3 F (36.8 C) (Oral)   Resp 14   Ht 5' 1.5" (1.562 m)   Wt 130 lb 12.8 oz (59.3 kg)   SpO2 99%   BMI 24.31 kg/m   BP Readings from Last 3 Encounters:  01/05/19 102/68  11/18/18 108/77  11/06/17 116/78    Wt Readings from Last 3 Encounters:  01/05/19 130 lb 12.8 oz (59.3 kg)  11/18/18 125 lb 8 oz (56.9 kg)  11/06/17 125 lb (56.7 kg)    General appearance: alert, cooperative and appears stated age Ears: normal TM's and external ear canals both ears Throat: lips, mucosa, and tongue normal; teeth and gums normal Neck: no adenopathy, no carotid bruit, supple, symmetrical, trachea midline  and thyroid not enlarged, symmetric, no tenderness/mass/nodules Back: symmetric, no curvature. ROM normal. No CVA tenderness. Lungs: clear to auscultation bilaterally Heart: regular rate and rhythm, S1, S2 normal, no murmur, click, rub or gallop Abdomen: soft, non-tender; bowel sounds normal; no masses,  no organomegaly Pulses: 2+ and symmetric Skin: Skin color, texture, turgor normal. No rashes or lesions Lymph nodes: Cervical, supraclavicular, and axillary nodes normal.  Lab Results  Component Value  Date   HGBA1C 5.1 10/28/2015    Lab Results  Component Value Date   CREATININE 0.66 11/18/2018   CREATININE 0.76 11/06/2017   CREATININE 0.74 11/01/2016    Lab Results  Component Value Date   WBC 5.2 11/01/2016   HGB 12.8 11/01/2016   HCT 37.3 11/01/2016   PLT 233 11/01/2016   GLUCOSE 87 11/18/2018   CHOL 176 11/18/2018   TRIG 78 11/18/2018   HDL 58 11/18/2018   LDLCALC 102 (H) 11/18/2018   ALT 22 11/18/2018   AST 27 11/18/2018   NA 137 11/18/2018   K 3.9 11/18/2018   CL 100 11/18/2018   CREATININE 0.66 11/18/2018   BUN 13 11/18/2018   CO2 22 11/18/2018   TSH 2.140 11/01/2016   HGBA1C 5.1 10/28/2015    No results found.  Assessment & Plan:   Problem List Items Addressed This Visit    Recurrent sinus infections    Secondary to persistent inflammation from allergic rhinitis..  Now managed with zyrtec, , Singulair, and  allergy desensitization       Skin lesion of back    Persistent,  And present since March .  Referral to Dr Royetta CarIsenstein       Weak urinary stream    Symptoms reported by patient.  Advised to suspend singulair and zyrtec or one week to rule out medication side effect  .  Post void residual followed by  Urology evaluation if positive for retention .        Other Visit Diagnoses    Allergic rhinitis, unspecified seasonality, unspecified trigger    -  Primary   Relevant Medications   montelukast (SINGULAIR) 10 MG tablet   Other Relevant Orders   Ambulatory referral to ENT   Skin cancer screening       Relevant Orders   Ambulatory referral to Dermatology   Vision test       Relevant Orders   Ambulatory referral to Ophthalmology     A total of 25 minutes of face to face time was spent with patient more than half of which was spent in counselling about the above mentioned conditions  and coordination of care  I have discontinued Brandi Johnson's methylPREDNISolone and amoxicillin-clavulanate. I have also changed her montelukast. Additionally,  I am having her maintain her levonorgestrel, multivitamin, loratadine, cetirizine, Azelastine-Fluticasone, and Biotin.  Meds ordered this encounter  Medications  . montelukast (SINGULAIR) 10 MG tablet    Sig: Take 1 tablet (10 mg total) by mouth at bedtime.    Dispense:  90 tablet    Refill:  3    Medications Discontinued During This Encounter  Medication Reason  . amoxicillin-clavulanate (AUGMENTIN) 875-125 MG tablet Patient has not taken in last 30 days  . methylPREDNISolone (MEDROL DOSEPAK) 4 MG TBPK tablet Patient has not taken in last 30 days  . montelukast (SINGULAIR) 10 MG tablet Reorder    Follow-up: Return in about 1 year (around 01/06/2020).   Sherlene Shamseresa L Tullo, MD

## 2019-01-05 NOTE — Assessment & Plan Note (Signed)
Secondary to persistent inflammation from allergic rhinitis..  Now managed with zyrtec, , Singulair, and  allergy desensitization

## 2019-01-05 NOTE — Assessment & Plan Note (Signed)
Persistent,  And present since March .  Referral to Dr Roseanne Kaufman

## 2019-01-05 NOTE — Assessment & Plan Note (Signed)
Symptoms reported by patient.  Advised to suspend singulair and zyrtec or one week to rule out medication side effect  .  Post void residual followed by  Urology evaluation if positive for retention .

## 2019-03-23 MED FILL — MONTELUKAST SOD 10 MG TAB: 10 | 90 days supply | Qty: 90 | Fill #0

## 2019-07-17 ENCOUNTER — Ambulatory Visit: Payer: No Typology Code available for payment source | Admitting: Family

## 2019-07-17 ENCOUNTER — Other Ambulatory Visit: Payer: Self-pay

## 2019-07-17 ENCOUNTER — Ambulatory Visit (INDEPENDENT_AMBULATORY_CARE_PROVIDER_SITE_OTHER): Payer: Self-pay | Admitting: Physician Assistant

## 2019-07-17 VITALS — BP 105/65 | HR 72 | Temp 98.7°F | Resp 14 | Wt 129.0 lb

## 2019-07-17 DIAGNOSIS — R350 Frequency of micturition: Secondary | ICD-10-CM

## 2019-07-17 DIAGNOSIS — R8290 Unspecified abnormal findings in urine: Secondary | ICD-10-CM

## 2019-07-17 DIAGNOSIS — N920 Excessive and frequent menstruation with regular cycle: Secondary | ICD-10-CM

## 2019-07-17 LAB — POCT URINALYSIS DIPSTICK
Blood, UA: NEGATIVE
Glucose, UA: NEGATIVE
Nitrite, UA: NEGATIVE
Protein, UA: NEGATIVE
Spec Grav, UA: 1.01 (ref 1.010–1.025)
Urobilinogen, UA: 0.2 E.U./dL
pH, UA: 5 (ref 5.0–8.0)

## 2019-07-17 LAB — POCT URINE PREGNANCY: Preg Test, Ur: NEGATIVE

## 2019-07-17 MED ORDER — NITROFURANTOIN MONOHYD MACRO 100 MG PO CAPS: 100.0000 mg | ORAL_CAPSULE | Freq: Two times a day (BID) | ORAL | 0 refills | Status: AC

## 2019-07-17 NOTE — Patient Instructions (Addendum)
Thank you for choosing [facility] for your health care needs.  You have been diagnosed with: 1. Urinary frequency - POCT urinalysis dipstick - nitrofurantoin, macrocrystal-monohydrate, (MACROBID) 100 MG capsule; Take 1 capsule (100 mg total) by mouth 2 (two) times daily for 5 days.  Dispense: 10 capsule; Refill: 0  2. Spotting - POCT urine pregnancy  3. Abnormal finding on urinalysis  Your urinalysis revealed leukocytes - an indication of infection. Your urine pregnancy test was negative. Take antibiotic as prescribed.  Increase fluids; water and/or cranberry juice. Empty bladder frequently. May use over the counter Azo (Pyridium) for symptom relief.  Follow-up with family physician, urgent care, or Ob/Gyn in 2 days if symptoms not significantly improved. Follow-up sooner with worsening pain, fever, back pain, nausea/vomiting, or other new/concerning symptom.  Urinary Tract Infection, Adult A urinary tract infection (UTI) is an infection of any part of the urinary tract. The urinary tract includes:  The kidneys.  The ureters.  The bladder.  The urethra. These organs make, store, and get rid of pee (urine) in the body. What are the causes? This is caused by germs (bacteria) in your genital area. These germs grow and cause swelling (inflammation) of your urinary tract. What increases the risk? You are more likely to develop this condition if:  You have a small, thin tube (catheter) to drain pee.  You cannot control when you pee or poop (incontinence).  You are female, and: ? You use these methods to prevent pregnancy: ? A medicine that kills sperm (spermicide). ? A device that blocks sperm (diaphragm). ? You have low levels of a female hormone (estrogen). ? You are pregnant.  You have genes that add to your risk.  You are sexually active.  You take antibiotic medicines.  You have trouble peeing because of: ? A prostate that is bigger than normal, if you are female.  ? A blockage in the part of your body that drains pee from the bladder (urethra). ? A kidney stone. ? A nerve condition that affects your bladder (neurogenic bladder). ? Not getting enough to drink. ? Not peeing often enough.  You have other conditions, such as: ? Diabetes. ? A weak disease-fighting system (immune system). ? Sickle cell disease. ? Gout. ? Injury of the spine. What are the signs or symptoms? Symptoms of this condition include:  Needing to pee right away (urgently).  Peeing often.  Peeing small amounts often.  Pain or burning when peeing.  Blood in the pee.  Pee that smells bad or not like normal.  Trouble peeing.  Pee that is cloudy.  Fluid coming from the vagina, if you are female.  Pain in the belly or lower back. Other symptoms include:  Throwing up (vomiting).  No urge to eat.  Feeling mixed up (confused).  Being tired and grouchy (irritable).  A fever.  Watery poop (diarrhea). How is this treated? This condition may be treated with:  Antibiotic medicine.  Other medicines.  Drinking enough water. Follow these instructions at home:  Medicines  Take over-the-counter and prescription medicines only as told by your doctor.  If you were prescribed an antibiotic medicine, take it as told by your doctor. Do not stop taking it even if you start to feel better. General instructions  Make sure you: ? Pee until your bladder is empty. ? Do not hold pee for a long time. ? Empty your bladder after sex. ? Wipe from front to back after pooping if you are a female. Use  each tissue one time when you wipe.  Drink enough fluid to keep your pee pale yellow.  Keep all follow-up visits as told by your doctor. This is important. Contact a doctor if:  You do not get better after 1-2 days.  Your symptoms go away and then come back. Get help right away if:  You have very bad back pain.  You have very bad pain in your lower belly.  You have  a fever.  You are sick to your stomach (nauseous).  You are throwing up. Summary  A urinary tract infection (UTI) is an infection of any part of the urinary tract.  This condition is caused by germs in your genital area.  There are many risk factors for a UTI. These include having a small, thin tube to drain pee and not being able to control when you pee or poop.  Treatment includes antibiotic medicines for germs.  Drink enough fluid to keep your pee pale yellow. This information is not intended to replace advice given to you by your health care provider. Make sure you discuss any questions you have with your health care provider. Document Released: 06/04/2008 Document Revised: 12/04/2018 Document Reviewed: 06/26/2018 Elsevier Patient Education  2020 [employer].

## 2019-07-17 NOTE — Progress Notes (Signed)
Patient ID: Brandi Johnson DOB: [birth year] AGE: 33 y.o. MRN: [id number]   PCP: Crecencio Mc, MD   Chief Complaint:  Chief Complaint  Patient presents with  . Urinary Frequency    2-3 DAYS   . Bloated    1 WEEK, PRESSURE     Subjective:    HPI:  Brandi Johnson is a 33 y.o. female presents for evaluation  Chief Complaint  Patient presents with  . Urinary Frequency    2-3 DAYS   . Bloated    1 WEEK, PRESSURE   33 year old female presents to [facility] with one week history of bloating/lower abdominal pressure. Mild in severity. Mild pain/discomfort. Over past 3 days has developed increased urinary frequency, worsening suprapubic pressure (with mild improvement with urination), and nocturia. Has not taken any OTC medication for symptom relief. Denies fever, chills, back pain, nausea/vomiting, vaginal rash/discharge/pruritis, dysuria, diarrhea, constipation. Patient approximately 4-5 weeks ago, had vaginal spotting (typically has spotting due to IUD, was slightly heavier than normal). Due to fatigue and lower abdominal pressure, this past Monday 7/13 took an at-home urine pregnancy test, was negative. Patient denies concern for STD.  Last Ob/Gyn visit 11/18/2018. [id number]. IUD check, placed Oct 2015. Pap performed, neative for intraepithelial lesions or malignancy, and HPV not detected.  Patient with previous history of left flank pain, suspected nephrolithiasis. No CT scan performed. Symptoms resolved with Percocet and time. Does not remember passing stone.  Patient with previous history of bilateral hydronephrosis when pregnant. Self-resolved. Etiology unclear.  A limited review of symptoms was performed, pertinent positives and negatives as mentioned in HPI.  The following portions of the patient's history were reviewed and updated as appropriate: allergies, current medications and past medical history.  Patient Active Problem List   Diagnosis Date Noted  .  Weak urinary stream 01/05/2019  . Skin lesion of back 01/05/2019  . Vitamin D deficiency 11/02/2016  . Recurrent sinus infections 03/07/2016  . Attention deficit disorder (ADD) 10/25/2015  . Family history of hemochromatosis 05/25/2013  . Routine general medical examination at a health care facility 05/25/2013  . Screening for cervical cancer 11/18/2011  . History of renal calculi     Allergies  Allergen Reactions  . Sulfa Antibiotics Rash    Current Outpatient Medications on File Prior to Visit  Medication Sig Dispense Refill  . Biotin 10 MG CAPS Take by mouth.    . cetirizine (ZYRTEC) 10 MG tablet Take 1 tablet (10 mg total) daily by mouth. 90 tablet 4  . levonorgestrel (MIRENA) 20 MCG/24HR IUD 1 each by Intrauterine route once.    . montelukast (SINGULAIR) 10 MG tablet Take 1 tablet (10 mg total) by mouth at bedtime. 90 tablet 3  . Prenatal Vit-Fe Fumarate-FA (PX PRENATAL MULTIVITAMINS PO) Take by mouth.    . Azelastine-Fluticasone (DYMISTA) 137-50 MCG/ACT SUSP Place into the nose.    . loratadine (CLARITIN) 10 MG tablet Take 1 tablet (10 mg total) daily by mouth. (Patient not taking: Reported on 07/17/2019) 120 tablet 4  . Multiple Vitamin (MULTIVITAMIN) tablet Take 1 tablet by mouth daily.     No current facility-administered medications on file prior to visit.        Objective:   Vitals:   07/17/19 1545  BP: 105/65  Pulse: 72  Resp: 14  Temp: 98.7 F (37.1 C)  SpO2: 98%     Wt Readings from Last 3 Encounters:  07/17/19 129 lb (58.5 kg)  01/05/19 130 lb 12.8  oz (59.3 kg)  11/18/18 125 lb 8 oz (56.9 kg)    Physical Exam:   General Appearance:  Patient sitting comfortably on examination table. Conversational. Peri JeffersonGood self-historian. In no acute distress. Afebrile.   Head:  Normocephalic, without obvious abnormality, atraumatic  Eyes:  PERRL, conjunctiva/corneas clear, EOM's intact  Lungs:   Clear to auscultation bilaterally, respirations unlabored. Good aeration.  No rales, rhonchi, crackles or wheezing.  Heart:  Regular rate and rhythm, S1 and S2 normal, no murmur, rub, or gallop  Abdomen:   Normal to inspection. Normoactive bowel sounds. Mild tenderness with palpation in suprapubic and LLQ. No guarding, rigidity or rebound tenderness. No palpable organomegaly. No CVA tenderness with percussion bilaterally.  Extremities: Extremities normal, atraumatic, no cyanosis or edema  Pulses: 2+ and symmetric  Skin: Skin color, texture, turgor normal, no rashes or lesions  Lymph nodes: Cervical, supraclavicular, and axillary nodes normal  Neurologic: Normal    Assessment & Plan:    Exam findings, diagnosis etiology and medication use and indications reviewed with patient. Follow-Up and discharge instructions provided. No emergent/urgent issues found on exam.  Patient education was provided.   Patient verbalized understanding of information provided and agrees with plan of care (POC), all questions answered. The patient is advised to call or return to clinic if condition does not see an improvement in symptoms, or to seek the care of the closest emergency department if condition worsens with the below plan.    Recent Results (from the past 2160 hour(s))  POCT urine pregnancy     Status: Normal   Collection Time: 07/17/19  4:05 PM  Result Value Ref Range   Preg Test, Ur Negative Negative  POCT urinalysis dipstick     Status: Abnormal   Collection Time: 07/17/19  4:06 PM  Result Value Ref Range   Color, UA YELLOW    Clarity, UA CLEAR    Glucose, UA Negative Negative   Bilirubin, UA NE    Ketones, UA TRACE    Spec Grav, UA 1.010 1.010 - 1.025   Blood, UA NEGATIVE    pH, UA 5.0 5.0 - 8.0   Protein, UA Negative Negative   Urobilinogen, UA 0.2 0.2 or 1.0 E.U./dL   Nitrite, UA NEGATIVE    Leukocytes, UA Moderate (2+) (A) Negative   Appearance     Odor     1. Urinary frequency - POCT urinalysis dipstick - nitrofurantoin, macrocrystal-monohydrate,  (MACROBID) 100 MG capsule; Take 1 capsule (100 mg total) by mouth 2 (two) times daily for 5 days.  Dispense: 10 capsule; Refill: 0  2. Spotting - POCT urine pregnancy  3. Abnormal finding on urinalysis  33 year old female presents with one week history of lower abdominal bloating and 3 day history of increased urinary frequency. VSS, afebrile, in no acute distress, no CVA tenderness with percussion, benign abdominal exam. UA with moderate leukocytes. No blood, no nitrites. Urine pregnancy test normal. Will treat for uncomplicated UTI. Prescribed 5-day course of Macrobid 100mg  bid. Patient declined rx for Pyridium. Advised f/u with PCP, urgent care, or Ob/Gyn in 2 days if symptoms not improving, sooner with any worsening symptoms. Discussed differential including nephrolithiasis/ureteral stone, constipation, ovarian cyst, STD, etc. given abdominal discomfort. Patient agreed to f/u with PCP or Ob/Gyn for further evaluation if increased urinary frequency resolves, but bloating/abdominal discomfort does not.   Janalyn HarderSamantha Mayson Mcneish, MHS, PA-C Rulon SeraSamantha F. Ryliee Figge, MHS, PA-C Advanced Practice Provider [facility]  [facility]  [phone] [place], [place]  [address] (p): [phone] [email or web address]

## 2019-07-21 ENCOUNTER — Telehealth: Payer: Self-pay

## 2019-07-21 NOTE — Telephone Encounter (Signed)
I left a message to the patient asking to call us back. 

## 2019-11-11 ENCOUNTER — Ambulatory Visit: Payer: Self-pay | Admitting: Obstetrics and Gynecology

## 2019-11-11 ENCOUNTER — Ambulatory Visit (INDEPENDENT_AMBULATORY_CARE_PROVIDER_SITE_OTHER): Payer: No Typology Code available for payment source | Admitting: Certified Nurse Midwife

## 2019-11-11 ENCOUNTER — Other Ambulatory Visit: Payer: Self-pay

## 2019-11-11 ENCOUNTER — Encounter: Payer: Self-pay | Admitting: Certified Nurse Midwife

## 2019-11-11 VITALS — BP 104/49 | HR 78 | Ht 61.0 in | Wt 124.0 lb

## 2019-11-11 DIAGNOSIS — Z30430 Encounter for insertion of intrauterine contraceptive device: Secondary | ICD-10-CM

## 2019-11-11 NOTE — Progress Notes (Signed)
   GYNECOLOGY OFFICE PROCEDURE NOTE  Brandi Johnson is a 33 y.o. [id number] here for Mirena IUD insertion. No GYN concerns.  Last pap smear was on 11/18/18 and was normal.  IUD Insertion Procedure Note Patient identified, informed consent performed, consent signed.   Discussed risks of irregular bleeding, cramping, infection, malpositioning or misplacement of the IUD outside the uterus which may require further procedure such as laparoscopy. Also discussed >99% contraception efficacy, increased risk of ectopic pregnancy with failure of method.  Time out was performed.  Urine pregnancy test negative.  Speculum placed in the vagina.  Cervix visualized.  Cleaned with Betadine x 2.  Grasped anteriorly with a single tooth tenaculum.  Uterus sounded to 7.5 cm.  Mirena IUD placed per manufacturer's recommendations.  Strings trimmed to 3 cm.  While trimming strings, string did not cut and got caught on scissors, then when removing scissors the IUD was pulled out. ,IUD came out. Another IUD was placed Tenaculum was removed, good hemostasis noted.  Patient tolerated procedure well.   Patient was given post-procedure instructions.  She was advised to have backup contraception for one week.  Patient was also asked to check IUD strings periodically and follow up in 4 weeks for IUD check.   Philip Aspen, CNM

## 2019-11-11 NOTE — Patient Instructions (Signed)
Intrauterine Device Insertion, Care After  This sheet gives you information about how to care for yourself after your procedure. Your health care provider may also give you more specific instructions. If you have problems or questions, contact your health care provider. What can I expect after the procedure? After the procedure, it is common to have:  Cramps and pain in the abdomen.  Light bleeding (spotting) or heavier bleeding that is like your menstrual period. This may last for up to a few days.  Lower back pain.  Dizziness.  Headaches.  Nausea. Follow these instructions at home:  Before resuming sexual activity, check to make sure that you can feel the IUD string(s). You should be able to feel the end of the string(s) below the opening of your cervix. If your IUD string is in place, you may resume sexual activity. ? If you had a hormonal IUD inserted more than 7 days after your most recent period started, you will need to use a backup method of birth control for 7 days after IUD insertion. Ask your health care provider whether this applies to you.  Continue to check that the IUD is still in place by feeling for the string(s) after every menstrual period, or once a month.  Take over-the-counter and prescription medicines only as told by your health care provider.  Do not drive or use heavy machinery while taking prescription pain medicine.  Keep all follow-up visits as told by your health care provider. This is important. Contact a health care provider if:  You have bleeding that is heavier or lasts longer than a normal menstrual cycle.  You have a fever.  You have cramps or abdominal pain that get worse or do not get better with medicine.  You develop abdominal pain that is new or is not in the same area of earlier cramping and pain.  You feel lightheaded or weak.  You have abnormal or bad-smelling discharge from your vagina.  You have pain during sexual activity.   You have any of the following problems with your IUD string(s): ? The string bothers or hurts you or your sexual partner. ? You cannot feel the string. ? The string has gotten longer.  You can feel the IUD in your vagina.  You think you may be pregnant, or you miss your menstrual period.  You think you may have an STI (sexually transmitted infection). Get help right away if:  You have flu-like symptoms.  You have a fever and chills.  You can feel that your IUD has slipped out of place. Summary  After the procedure, it is common to have cramps and pain in the abdomen. It is also common to have light bleeding (spotting) or heavier bleeding that is like your menstrual period.  Continue to check that the IUD is still in place by feeling for the string(s) after every menstrual period, or once a month.  Keep all follow-up visits as told by your health care provider. This is important.  Contact your health care provider if you have problems with your IUD string(s), such as the string getting longer or bothering you or your sexual partner. This information is not intended to replace advice given to you by your health care provider. Make sure you discuss any questions you have with your health care provider. Document Released: 08/15/2011 Document Revised: 11/29/2017 Document Reviewed: 11/07/2016 Elsevier Patient Education  2020 [employer]  

## 2019-11-20 ENCOUNTER — Encounter: Payer: 59 | Admitting: Obstetrics and Gynecology

## 2019-11-24 ENCOUNTER — Encounter: Payer: Self-pay | Admitting: Certified Nurse Midwife

## 2019-11-24 ENCOUNTER — Other Ambulatory Visit: Payer: Self-pay

## 2019-11-24 ENCOUNTER — Ambulatory Visit (INDEPENDENT_AMBULATORY_CARE_PROVIDER_SITE_OTHER): Payer: No Typology Code available for payment source | Admitting: Certified Nurse Midwife

## 2019-11-24 VITALS — BP 103/59 | HR 68 | Ht 61.5 in | Wt 121.2 lb

## 2019-11-24 DIAGNOSIS — Z01419 Encounter for gynecological examination (general) (routine) without abnormal findings: Secondary | ICD-10-CM | POA: Diagnosis not present

## 2019-11-24 NOTE — Progress Notes (Signed)
GYNECOLOGY ANNUAL PREVENTATIVE CARE ENCOUNTER NOTE  History:     Brandi Johnson is a 33 y.o. G62P2002 female here for a routine annual gynecologic exam.  Current complaints: none.   Denies abnormal vaginal bleeding, discharge, pelvic pain, problems with intercourse or other gynecologic concerns.     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 (Menstrual status: IUD). Contraception: IUD Last Pap: 11/18/18. Results were: normal with negative HPV Last mammogram: n/a .   Obstetric History OB History  Gravida Para Term Preterm AB Living  2 2 2     2   SAB TAB Ectopic Multiple Live Births          2    # Outcome Date GA Lbr Len/2nd Weight Sex Delivery Anes PTL Lv  2 Term 2015   7 lb 6.4 oz (3.357 kg) F CS-LTranv   LIV  1 Term 2012   8 lb 1.6 oz (3.674 kg) M CS-LTranv   LIV    Past Medical History:  Diagnosis Date  . AR (allergic rhinitis)   . Hemorrhoids in pregnancy   . History of renal calculi    with hydroneprosis noted during pregnancy  . Hydronephrosis   . Motion sickness   . UTI (lower urinary tract infection)     Past Surgical History:  Procedure Laterality Date  . CESAREAN SECTION  2012    Current Outpatient Medications on File Prior to Visit  Medication Sig Dispense Refill  . Azelastine-Fluticasone (DYMISTA) 137-50 MCG/ACT SUSP Place into the nose.    . Biotin 10 MG CAPS Take by mouth.    . cetirizine (ZYRTEC) 10 MG tablet Take 1 tablet (10 mg total) daily by mouth. 90 tablet 4  . levonorgestrel (MIRENA) 20 MCG/24HR IUD 1 each by Intrauterine route once.    . loratadine (CLARITIN) 10 MG tablet Take 1 tablet (10 mg total) daily by mouth. 120 tablet 4  . montelukast (SINGULAIR) 10 MG tablet Take 1 tablet (10 mg total) by mouth at bedtime. 90 tablet 3  . Multiple Vitamin (MULTIVITAMIN) tablet Take 1 tablet by mouth daily.    . Omega-3 Fatty Acids (FISH OIL) 1000 MG CAPS Take by mouth.    .  Prenatal Vit-Fe Fumarate-FA (PX PRENATAL MULTIVITAMINS PO) Take by mouth.     No current facility-administered medications on file prior to visit.     Allergies  Allergen Reactions  . Sulfa Antibiotics Rash    Social History:  reports that she has never smoked. She has never used smokeless tobacco. She reports that she does not drink alcohol or use drugs.  Exercise 4-5 times wk 30-40 min.  Denies smoking and drugs. Occasional alcohol.   Family History  Problem Relation Age of Onset  . Mental illness Mother   . Colitis Mother        collagenous  . Colon polyps Mother   . Heart disease Father 73       CAD s/p CABG  . Diabetes Father   . Skin cancer Father   . Diabetes Maternal Grandmother   . Hyperlipidemia Maternal Grandmother   . Stroke Maternal Grandmother   . Stroke Paternal Grandmother   . Celiac disease Sister   . Cancer Neg Hx     The following portions of the patient's history were reviewed and updated as appropriate: allergies, current medications, past family history, past medical history, past social history, past surgical history and problem list.  Review  of Systems Pertinent items noted in HPI and remainder of comprehensive ROS otherwise negative.  Physical Exam:  There were no vitals taken for this visit. CONSTITUTIONAL: Well-developed, well-nourished female in no acute distress.  HENT:  Normocephalic, atraumatic, External right and left ear normal. Oropharynx is clear and moist EYES: Conjunctivae and EOM are normal. Pupils are equal, round, and reactive to light. No scleral icterus.  NECK: Normal range of motion, supple, no masses.  Normal thyroid.  SKIN: Skin is warm and dry. No rash noted. Not diaphoretic. No erythema. No pallor. MUSCULOSKELETAL: Normal range of motion. No tenderness.  No cyanosis, clubbing, or edema.  2+ distal pulses. NEUROLOGIC: Alert and oriented to person, place, and time. Normal reflexes, muscle tone coordination.  PSYCHIATRIC:  Normal mood and affect. Normal behavior. Normal judgment and thought content. CARDIOVASCULAR: Normal heart rate noted, regular rhythm RESPIRATORY: Clear to auscultation bilaterally. Effort and breath sounds normal, no problems with respiration noted. BREASTS: Symmetric in size. No masses, tenderness, skin changes, nipple drainage, or lymphadenopathy bilaterally. ABDOMEN: Soft, no distention noted.  No tenderness, rebound or guarding.  PELVIC: Normal appearing external genitalia and urethral meatus; normal appearing vaginal mucosa and cervix.  No abnormal discharge noted.  Pap smear not indicated. Strings present.  Normal uterine size, no other palpable masses, no uterine or adnexal tenderness.   Assessment and Plan:  Annual Well Women GYN Exam  Will follow up results of pap smear and manage accordingly. Mammogram scheduled Labs: none due Refills: none Routine preventative health maintenance measures emphasized. Please refer to After Visit Summary for other counseling recommendations.      Philip Aspen, CNM

## 2019-11-24 NOTE — Patient Instructions (Signed)
Preventive Care 21-34 Years Old, Female Preventive care refers to visits with your health care provider and lifestyle choices that can promote health and wellness. This includes:  A yearly physical exam. This may also be called an annual well check.  Regular dental visits and eye exams.  Immunizations.  Screening for certain conditions.  Healthy lifestyle choices, such as eating a healthy diet, getting regular exercise, not using drugs or products that contain nicotine and tobacco, and limiting alcohol use. What can I expect for my preventive care visit? Physical exam Your health care provider will check your:  Height and weight. This may be used to calculate body mass index (BMI), which tells if you are at a healthy weight.  Heart rate and blood pressure.  Skin for abnormal spots. Counseling Your health care provider may ask you questions about your:  Alcohol, tobacco, and drug use.  Emotional well-being.  Home and relationship well-being.  Sexual activity.  Eating habits.  Work and work environment.  Method of birth control.  Menstrual cycle.  Pregnancy history. What immunizations do I need?  Influenza (flu) vaccine  This is recommended every year. Tetanus, diphtheria, and pertussis (Tdap) vaccine  You may need a Td booster every 10 years. Varicella (chickenpox) vaccine  You may need this if you have not been vaccinated. Human papillomavirus (HPV) vaccine  If recommended by your health care provider, you may need three doses over 6 months. Measles, mumps, and rubella (MMR) vaccine  You may need at least one dose of MMR. You may also need a second dose. Meningococcal conjugate (MenACWY) vaccine  One dose is recommended if you are age 19-21 years and a first-year college student living in a residence hall, or if you have one of several medical conditions. You may also need additional booster doses. Pneumococcal conjugate (PCV13) vaccine  You may need  this if you have certain conditions and were not previously vaccinated. Pneumococcal polysaccharide (PPSV23) vaccine  You may need one or two doses if you smoke cigarettes or if you have certain conditions. Hepatitis A vaccine  You may need this if you have certain conditions or if you travel or work in places where you may be exposed to hepatitis A. Hepatitis B vaccine  You may need this if you have certain conditions or if you travel or work in places where you may be exposed to hepatitis B. Haemophilus influenzae type b (Hib) vaccine  You may need this if you have certain conditions. You may receive vaccines as individual doses or as more than one vaccine together in one shot (combination vaccines). Talk with your health care provider about the risks and benefits of combination vaccines. What tests do I need?  Blood tests  Lipid and cholesterol levels. These may be checked every 5 years starting at age 20.  Hepatitis C test.  Hepatitis B test. Screening  Diabetes screening. This is done by checking your blood sugar (glucose) after you have not eaten for a while (fasting).  Sexually transmitted disease (STD) testing.  BRCA-related cancer screening. This may be done if you have a family history of breast, ovarian, tubal, or peritoneal cancers.  Pelvic exam and Pap test. This may be done every 3 years starting at age 21. Starting at age 30, this may be done every 5 years if you have a Pap test in combination with an HPV test. Talk with your health care provider about your test results, treatment options, and if necessary, the need for more tests.   Follow these instructions at home: Eating and drinking   Eat a diet that includes fresh fruits and vegetables, whole grains, lean protein, and low-fat dairy.  Take vitamin and mineral supplements as recommended by your health care provider.  Do not drink alcohol if: ? Your health care provider tells you not to drink. ? You are  pregnant, may be pregnant, or are planning to become pregnant.  If you drink alcohol: ? Limit how much you have to 0-1 drink a day. ? Be aware of how much alcohol is in your drink. In the [place] one drink equals one 12 oz bottle of beer (355 mL), one 5 oz glass of wine (148 mL), or one 1 oz glass of hard liquor (44 mL). Lifestyle  Take daily care of your teeth and gums.  Stay active. Exercise for at least 30 minutes on 5 or more days each week.  Do not use any products that contain nicotine or tobacco, such as cigarettes, e-cigarettes, and chewing tobacco. If you need help quitting, ask your health care provider.  If you are sexually active, practice safe sex. Use a condom or other form of birth control (contraception) in order to prevent pregnancy and STIs (sexually transmitted infections). If you plan to become pregnant, see your health care provider for a preconception visit. What's next?  Visit your health care provider once a year for a well check visit.  Ask your health care provider how often you should have your eyes and teeth checked.  Stay up to date on all vaccines. This information is not intended to replace advice given to you by your health care provider. Make sure you discuss any questions you have with your health care provider. Document Released: 02/12/2002 Document Revised: 08/28/2018 Document Reviewed: 08/28/2018 Elsevier Patient Education  2020 Elsevier Inc.  

## 2019-11-24 NOTE — Progress Notes (Signed)
Patient here for annual exam, no complaints.  

## 2020-01-07 ENCOUNTER — Ambulatory Visit (INDEPENDENT_AMBULATORY_CARE_PROVIDER_SITE_OTHER): Payer: No Typology Code available for payment source | Admitting: Internal Medicine

## 2020-01-07 ENCOUNTER — Encounter: Payer: Self-pay | Admitting: Internal Medicine

## 2020-01-07 ENCOUNTER — Other Ambulatory Visit
Admission: RE | Admit: 2020-01-07 | Discharge: 2020-01-07 | Disposition: A | Discharge status: Home / Self Care | Payer: No Typology Code available for payment source | Source: Ambulatory Visit | Attending: Internal Medicine | Admitting: Internal Medicine

## 2020-01-07 ENCOUNTER — Other Ambulatory Visit: Payer: Self-pay

## 2020-01-07 VITALS — Ht 61.5 in | Wt 121.0 lb

## 2020-01-07 DIAGNOSIS — Z1283 Encounter for screening for malignant neoplasm of skin: Secondary | ICD-10-CM | POA: Diagnosis not present

## 2020-01-07 DIAGNOSIS — J309 Allergic rhinitis, unspecified: Secondary | ICD-10-CM | POA: Diagnosis not present

## 2020-01-07 DIAGNOSIS — R3912 Poor urinary stream: Secondary | ICD-10-CM | POA: Diagnosis not present

## 2020-01-07 DIAGNOSIS — J3089 Other allergic rhinitis: Secondary | ICD-10-CM | POA: Diagnosis not present

## 2020-01-07 LAB — URINALYSIS, ROUTINE W REFLEX MICROSCOPIC
Bilirubin Urine: NEGATIVE
Glucose, UA: NEGATIVE mg/dL
Hgb urine dipstick: NEGATIVE
Ketones, ur: NEGATIVE mg/dL
Leukocytes,Ua: NEGATIVE
Nitrite: NEGATIVE
Protein, ur: NEGATIVE mg/dL
Specific Gravity, Urine: 1.005 (ref 1.005–1.030)
pH: 6 (ref 5.0–8.0)

## 2020-01-07 NOTE — Progress Notes (Signed)
Virtual Visit via D[employer]  This visit type was conducted due to national recommendations for restrictions regarding the COVID-19 pandemic (e.g. social distancing).  This format is felt to be most appropriate for this patient at this time.  All issues noted in this document were discussed and addressed.  No physical exam was performed (except for noted visual exam findings with Video Visits).   I connected with@ on 01/07/20 at  8:00 AM EST by a video enabled telemedicine application and verified that I am speaking with the correct person using two identifiers. Location patient: home Location provider: work or home office Persons participating in the virtual visit: patient, provider  I discussed the limitations, risks, security and privacy concerns of performing an evaluation and management service by telephone and the availability of in person appointments. I also discussed with the patient that there may be a patient responsible charge related to this service. The patient expressed understanding and agreed to proceed.  Reason for visit: follow up   HPI:  Weak urinary stream improved with dc antihistamine.  Mother had h/o urinary stricture , thinks she may have one too since her stream never sounds  as strong as the women around her when she is in a public restroom.   Number of UTI's has decreased to one per year.    She has been receiving allergy desensitization and has had new allergies discovered  to wheat and cats   She is due for referral to [facility]   ROS: See pertinent positives and negatives per HPI.  Past Medical History:  Diagnosis Date  . AR (allergic rhinitis)   . Hemorrhoids in pregnancy   . History of renal calculi    with hydroneprosis noted during pregnancy  . Hydronephrosis   . Motion sickness   . UTI (lower urinary tract infection)     Past Surgical History:  Procedure Laterality Date  . CESAREAN SECTION  2012    Family History  Problem Relation  Age of Onset  . Mental illness Mother   . Colitis Mother        collagenous  . Colon polyps Mother   . Heart disease Father 81       CAD s/p CABG  . Diabetes Father   . Skin cancer Father   . Diabetes Maternal Grandmother   . Hyperlipidemia Maternal Grandmother   . Stroke Maternal Grandmother   . Stroke Paternal Grandmother   . Celiac disease Sister   . Cancer Neg Hx     SOCIAL HX:  reports that she has never smoked. She has never used smokeless tobacco. She reports that she does not drink alcohol or use drugs.  Current Outpatient Medications:  .  Azelastine-Fluticasone (DYMISTA) 137-50 MCG/ACT SUSP, Place 1 spray into the nose as needed. , Disp: , Rfl:  .  Biotin 10 MG CAPS, Take 10 mg by mouth daily. , Disp: , Rfl:  .  cetirizine (ZYRTEC) 10 MG tablet, Take 1 tablet (10 mg total) daily by mouth., Disp: 90 tablet, Rfl: 4 .  levonorgestrel (MIRENA) 20 MCG/24HR IUD, 1 each by Intrauterine route once., Disp: , Rfl:  .  loratadine (CLARITIN) 10 MG tablet, Take 1 tablet (10 mg total) daily by mouth. (Patient taking differently: Take 10 mg by mouth as needed. ), Disp: 120 tablet, Rfl: 4 .  montelukast (SINGULAIR) 10 MG tablet, Take 1 tablet (10 mg total) by mouth at bedtime., Disp: 90 tablet, Rfl: 3 .  Omega-3 Fatty Acids (FISH OIL) 1000  MG CAPS, Take by mouth., Disp: , Rfl:  .  Prenatal Vit-Fe Fumarate-FA (PX PRENATAL MULTIVITAMINS PO), Take by mouth., Disp: , Rfl:   EXAM:  VITALS per patient if applicable:  GENERAL: alert, oriented, appears well and in no acute distress  HEENT: atraumatic, conjunttiva clear, no obvious abnormalities on inspection of external nose and ears  NECK: normal movements of the head and neck  LUNGS: on inspection no signs of respiratory distress, breathing rate appears normal, no obvious gross SOB, gasping or wheezing  CV: no obvious cyanosis  MS: moves all visible extremities without noticeable abnormality  PSYCH/NEURO: pleasant and cooperative, no  obvious depression or anxiety, speech and thought processing grossly intact  ASSESSMENT AND PLAN:  Discussed the following assessment and plan:  Allergic rhinitis, unspecified seasonality, unspecified trigger - Plan: Ambulatory referral to ENT  Skin cancer screening - Plan: Ambulatory referral to Dermatology  Weak urinary stream - Plan: Urinalysis, Routine w reflex microscopic  Non-seasonal allergic rhinitis, unspecified trigger  Weak urinary stream Perceived by patient.  Checking UA to screen for hematuria   Allergic rhinitis With allergies to wheat and cats recently added .  Receiving  Desensitization injections.Marland Kitchen  stopped antihistamine due to side effects    I discussed the assessment and treatment plan with the patient. The patient was provided an opportunity to ask questions and all were answered. The patient agreed with the plan and demonstrated an understanding of the instructions.   The patient was advised to call back or seek an in-person evaluation if the symptoms worsen or if the condition fails to improve as anticipated.    Brandi Shams, MD

## 2020-01-09 DIAGNOSIS — J309 Allergic rhinitis, unspecified: Secondary | ICD-10-CM | POA: Insufficient documentation

## 2020-01-09 NOTE — Assessment & Plan Note (Addendum)
With allergies to wheat and cats recently added .  Receiving  Desensitization injections.Marland Kitchen  stopped antihistamine due to side effects

## 2020-01-09 NOTE — Assessment & Plan Note (Signed)
Perceived by patient.  Checking UA to screen for hematuria

## 2020-02-26 ENCOUNTER — Other Ambulatory Visit: Payer: Self-pay | Admitting: Internal Medicine

## 2020-02-26 DIAGNOSIS — J309 Allergic rhinitis, unspecified: Secondary | ICD-10-CM

## 2020-04-02 ENCOUNTER — Ambulatory Visit (INDEPENDENT_AMBULATORY_CARE_PROVIDER_SITE_OTHER): Payer: No Typology Code available for payment source | Admitting: Family Medicine

## 2020-04-02 ENCOUNTER — Other Ambulatory Visit: Payer: Self-pay

## 2020-04-02 DIAGNOSIS — J069 Acute upper respiratory infection, unspecified: Secondary | ICD-10-CM | POA: Insufficient documentation

## 2020-04-02 NOTE — Assessment & Plan Note (Signed)
Symptoms could be a viral URI versus allergies though cannot rule out COVID-19 given her current symptoms.  Discussed having her contact employee health to arrange for Covid testing.  Advised on quarantine until results return.  Discussed if she had to be around her family members she would need to wear a mask.  Discussed treatment of allergies with continuing Singulair and Claritin as well as restarting her Dymista.  Discussed that if her Covid test is negative and she continues to have symptoms through the middle of this coming week she should send me a MyChart message and then we will treat with an antibiotic.

## 2020-04-02 NOTE — Progress Notes (Signed)
Virtual Visit via video Note  This visit type was conducted due to national recommendations for restrictions regarding the COVID-19 pandemic (e.g. social distancing).  This format is felt to be most appropriate for this patient at this time.  All issues noted in this document were discussed and addressed.  No physical exam was performed (except for noted visual exam findings with Video Visits).   I connected with Brandi Johnson today at 10:20 AM EDT by a video enabled telemedicine application and verified that I am speaking with the correct person using two identifiers. Location patient: home Location provider: work Persons participating in the virtual visit: patient, provider  I discussed the limitations, risks, security and privacy concerns of performing an evaluation and management service by telephone and the availability of in person appointments. I also discussed with the patient that there may be a patient responsible charge related to this service. The patient expressed understanding and agreed to proceed.   Reason for visit: same day visit  HPI: Upper respiratory infection: Patient notes onset of symptoms about 4 days ago.  She felt as though it was allergies initially though has developed additional symptoms.  Had some mild nasal congestion and postnasal drip with rhinorrhea that was clear.  No cough.  Notes her nasal congestion has started to become yellow in the morning.  No fevers.  No shortness of breath.  No taste or smell disturbances.  No COVID-19 exposure.  She does have a history of allergies and restarted on her Claritin and Singulair after she developed symptoms.  She also notes over the last couple of days feeling more tired and more fatigued.  She has also taken Afrin with the most benefit.  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   ROS: See pertinent positives and negatives per HPI.  Past Medical History:  Diagnosis Date  . AR  (allergic rhinitis)   . Hemorrhoids in pregnancy   . History of renal calculi    with hydroneprosis noted during pregnancy  . Hydronephrosis   . Motion sickness   . UTI (lower urinary tract infection)     Past Surgical History:  Procedure Laterality Date  . CESAREAN SECTION  2012    Family History  Problem Relation Age of Onset  . Mental illness Mother   . Colitis Mother        collagenous  . Colon polyps Mother   . Heart disease Father 59       CAD s/p CABG  . Diabetes Father   . Skin cancer Father   . Diabetes Maternal Grandmother   . Hyperlipidemia Maternal Grandmother   . Stroke Maternal Grandmother   . Stroke Paternal Grandmother   . Celiac disease Sister   . Cancer Neg Hx     SOCIAL HX: Non-smoker   Current Outpatient Medications:  .  Azelastine-Fluticasone (DYMISTA) 137-50 MCG/ACT SUSP, Place 1 spray into the nose as needed. , Disp: , Rfl:  .  Biotin 10 MG CAPS, Take 10 mg by mouth daily. , Disp: , Rfl:  .  cetirizine (ZYRTEC) 10 MG tablet, Take 1 tablet (10 mg total) daily by mouth., Disp: 90 tablet, Rfl: 4 .  Cholecalciferol (VITAMIN D3) 1.25 MG (50000 UT) TABS, Take by mouth., Disp: , Rfl:  .  levonorgestrel (MIRENA) 20 MCG/24HR IUD, 1 each by Intrauterine route once., Disp: , Rfl:  .  loratadine (CLARITIN) 10 MG tablet, Take 1 tablet (10 mg total) daily by  mouth. (Patient taking differently: Take 10 mg by mouth as needed. ), Disp: 120 tablet, Rfl: 4 .  montelukast (SINGULAIR) 10 MG tablet, TAKE 1 TABLET (10 MG TOTAL) BY MOUTH AT BEDTIME., Disp: 90 tablet, Rfl: 3 .  Omega-3 Fatty Acids (FISH OIL) 1000 MG CAPS, Take by mouth., Disp: , Rfl:   EXAM:  VITALS per patient if applicable:  GENERAL: alert, oriented, appears well and in no acute distress  HEENT: atraumatic, conjunttiva clear, no obvious abnormalities on inspection of external nose and ears  NECK: normal movements of the head and neck  LUNGS: on inspection no signs of respiratory distress,  breathing rate appears normal, no obvious gross SOB, gasping or wheezing  CV: no obvious cyanosis  MS: moves all visible extremities without noticeable abnormality  PSYCH/NEURO: pleasant and cooperative, no obvious depression or anxiety, speech and thought processing grossly intact  ASSESSMENT AND PLAN:  Discussed the following assessment and plan:  Upper respiratory infection Symptoms could be a viral URI versus allergies though cannot rule out COVID-19 given her current symptoms.  Discussed having her contact employee health to arrange for Covid testing.  Advised on quarantine until results return.  Discussed if she had to be around her family members she would need to wear a mask.  Discussed treatment of allergies with continuing Singulair and Claritin as well as restarting her Dymista.  Discussed that if her Covid test is negative and she continues to have symptoms through the middle of this coming week she should send me a MyChart message and then we will treat with an antibiotic.   No orders of the defined types were placed in this encounter.   No orders of the defined types were placed in this encounter.    I discussed the assessment and treatment plan with the patient. The patient was provided an opportunity to ask questions and all were answered. The patient agreed with the plan and demonstrated an understanding of the instructions.   The patient was advised to call back or seek an in-person evaluation if the symptoms worsen or if the condition fails to improve as anticipated.    [name], MD

## 2020-07-12 ENCOUNTER — Ambulatory Visit: Payer: No Typology Code available for payment source | Attending: Internal Medicine

## 2020-07-12 ENCOUNTER — Other Ambulatory Visit: Payer: Self-pay

## 2020-07-12 DIAGNOSIS — M62838 Other muscle spasm: Secondary | ICD-10-CM | POA: Insufficient documentation

## 2020-07-12 DIAGNOSIS — M533 Sacrococcygeal disorders, not elsewhere classified: Secondary | ICD-10-CM | POA: Insufficient documentation

## 2020-07-12 DIAGNOSIS — R293 Abnormal posture: Secondary | ICD-10-CM | POA: Insufficient documentation

## 2020-07-12 NOTE — Therapy (Signed)
[place] MAIN [facility] SERVICES [address] [place], [place], [address] Phone: [phone]   Fax:  [phone]  Patient Details  Name: Brandi Johnson MRN: [id number] Date of Birth: 1986-08-25 Referring Provider:  Crecencio Mc, MD  Encounter Date: 07/12/2020  Pelvic Floor Physical Therapy Screening  SCREENING  Falls in last 6 mo: no  Patient's communication preference:   Red Flags:  Have you had any night sweats? no Unexplained weight loss? no Saddle anesthesia? no Unexplained changes in bowel or bladder habits? no  SUBJECTIVE  Patient reports: Started around April/May she noticed that the LB hurts when mopping. June 6th she was doing a lunge to kick exercise and felt like she pulled a muscle which resolved with heat and rest but now the LBP is more frequent feeling it with every day activities and bending too even at night when trying to sleep.  ~ 2-3 years ago she thinks she may have fractured her tailbone.  Precautions:  none  Social/Family/Vocational History:   working full time doing mammography  Recent Procedures/Tests/Findings:  none  Obstetrical History: 2 c-sections 2012, 2015  Gynecological History: none Urinary History: none  Gastrointestinal History: None  Sexual activity/pain: none  Location of pain: B SIJ/ LB Current pain:  2/10  Max pain:  6/10 Least pain:  0/10 Nature of pain: dull achy with sharp stabs  Patient Goals: Sleep and mop without pain   OBJECTIVE  Posture/Observations:  Sitting:  Standing: R handed, 5" 1' very slightly high on L ASIS  Palpation/Segmental Motion/Joint Play:  Special tests:   Scoliosis: very slight R thoracolumbar curve (prominent R transverse processes) LLD: RLE 78 cm, LLE: 79cm    Range of Motion/Flexibilty:  Spine: R SB pain in L hip but WNL, L SB no pain and WNL, L ROT limited slightly with L SIJ discomfort, R ROT WNL. Forward bend ~ 2 in. From floor,  no pain. Hips:   Strength/MMT: Deferred LE MMT  LE MMT Left Right  Hip flex:  (L2) /5 /5  Hip ext: /5 /5  Hip abd: /5 /5  Hip add: /5 /5  Hip IR /5 /5  Hip ER /5 /5     Abdominal:  Palpation: Diastasis: 1 finger below umbilicus, narrows with chin-tuck  Pelvic Floor External Exam: Deferred Introitus Appears:  Skin integrity:  Palpation: Cough: Prolapse visible?: Scar mobility:  Internal Vaginal Exam: Strength (PERF):  Symmetry: Palpation: Prolapse:  Gait Analysis: WNL, not fully assessed   Pelvic Floor Outcome Measures: N/A  INTERVENTIONS THIS SESSION: Manual: performed PA mobs to all sacral borders and L3 spinous process folloed by MET x 2 for R anterior rotation.  Therex: educated on how to perform self MET correction and how to engage core in neutral to protect LB.  Total time: 60 min.  Pt. Encouraged to get referral for PT if her pain persists       Willa Rough DPT, ATC Willa Rough 07/12/2020, 4:04 PM  [place] MAIN [facility] SERVICES [address] [place], [place], [address] Phone: [phone]   Fax:  [phone]

## 2020-07-12 NOTE — Patient Instructions (Signed)
MET to Correct Right Anteriorly Rotated/Left Posteriorly Rotated Innominate   Begin laying on your back with your feet at 90 degrees. Put a dowel/broomstick  through your legs, behind your right knee and in front of your left knee. Stabilize the dowel on ether side with your hands.  Press down with the right leg and up with the left leg. Hold for 5 seconds  then slowly relax. Repeat 5 times.

## 2020-07-13 ENCOUNTER — Encounter: Payer: Self-pay | Admitting: Internal Medicine

## 2020-07-13 ENCOUNTER — Other Ambulatory Visit: Payer: Self-pay

## 2020-07-13 ENCOUNTER — Ambulatory Visit (INDEPENDENT_AMBULATORY_CARE_PROVIDER_SITE_OTHER): Payer: No Typology Code available for payment source | Admitting: Internal Medicine

## 2020-07-13 VITALS — BP 108/62 | HR 84 | Temp 97.4°F | Resp 15 | Ht 62.0 in | Wt 133.4 lb

## 2020-07-13 DIAGNOSIS — M461 Sacroiliitis, not elsewhere classified: Secondary | ICD-10-CM

## 2020-07-13 MED ORDER — MELOXICAM 15 MG PO TABS: 15.0000 mg | ORAL_TABLET | Freq: Every day | ORAL | 1 refills | Status: DC

## 2020-07-13 MED ORDER — TIZANIDINE HCL 4 MG PO CAPS: 4.0000 mg | ORAL_CAPSULE | Freq: Three times a day (TID) | ORAL | 1 refills | Status: DC | PRN

## 2020-07-13 MED ORDER — OMEPRAZOLE 20 MG PO CPDR: 20.0000 mg | DELAYED_RELEASE_CAPSULE | Freq: Every day | ORAL | 3 refills | Status: DC

## 2020-07-13 NOTE — Progress Notes (Signed)
Subjective:  Patient ID: Brandi Johnson, female    DOB: 1986/02/23  Age: 34 y.o. MRN: [id number]  CC: The encounter diagnosis was SI (sacroiliac) joint inflammation (HCC).  HPI Brandi Johnson presents for evaluation of low back pain   This visit occurred during the SARS-CoV-2 public health emergency.  Safety protocols were in place, including screening questions prior to the visit, additional usage of staff PPE, and extensive cleaning of exam room while observing appropriate contact time as indicated for disinfecting solutions.   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 Denies any unusual activities ,  No falls,  But has resumed an exercise program that includes barre work with high kicks.     Had PT yesterday and provider suggested source of pain was her muscle spasm and arthritis of  SI joint bilaterally.   Outpatient Medications Prior to Visit  Medication Sig Dispense Refill  . Azelastine-Fluticasone (DYMISTA) 137-50 MCG/ACT SUSP Place 1 spray into the nose as needed.     . Biotin 10 MG CAPS Take 10 mg by mouth daily.     . cetirizine (ZYRTEC) 10 MG tablet Take 1 tablet (10 mg total) daily by mouth. 90 tablet 4  . Cholecalciferol (VITAMIN D3) 1.25 MG (50000 UT) TABS Take by mouth.    . levonorgestrel (MIRENA) 20 MCG/24HR IUD 1 each by Intrauterine route once.    . loratadine (CLARITIN) 10 MG tablet Take 1 tablet (10 mg total) daily by mouth. (Patient taking differently: Take 10 mg by mouth as needed. ) 120 tablet 4  . montelukast (SINGULAIR) 10 MG tablet TAKE 1 TABLET (10 MG TOTAL) BY MOUTH AT BEDTIME. 90 tablet 3  . Omega-3 Fatty Acids (FISH OIL) 1000 MG CAPS Take by mouth.     No facility-administered medications prior to visit.    Review of Systems;  Patient denies headache, fevers, malaise, unintentional weight loss, skin rash, eye pain, sinus congestion and sinus pain, sore  throat, dysphagia,  hemoptysis , cough, dyspnea, wheezing, chest pain, palpitations, orthopnea, edema, abdominal pain, nausea, melena, diarrhea, constipation, flank pain, dysuria, hematuria, urinary  Frequency, nocturia, numbness, tingling, seizures,  Focal weakness, Loss of consciousness,  Tremor, insomnia, depression, anxiety, and suicidal ideation.      Objective:  BP 108/62 (BP Location: Left Arm, Patient Position: Sitting, Cuff Size: Normal)   Pulse 84   Temp (!) 97.4 F (36.3 C) (Oral)   Resp 15   Ht 5\' 2"  (1.575 m)   Wt 133 lb 6.4 oz (60.5 kg)   SpO2 99%   BMI 24.40 kg/m   BP Readings from Last 3 Encounters:  07/13/20 108/62  11/24/19 (!) 103/59  11/11/19 (!) 104/49    Wt Readings from Last 3 Encounters:  07/13/20 133 lb 6.4 oz (60.5 kg)  04/02/20 127 lb (57.6 kg)  01/07/20 121 lb (54.9 kg)    General appearance: alert, cooperative and appears stated age Ears: normal TM's and external ear canals both ears Throat: lips, mucosa, and tongue normal; teeth and gums normal Neck: no adenopathy, no carotid bruit, supple, symmetrical, trachea midline and thyroid not enlarged, symmetric, no tenderness/mass/nodules Back: symmetric, no curvature. ROM normal. No CVA tenderness. Lungs: clear to auscultation bilaterally Heart: regular rate and rhythm, S1, S2 normal, no murmur, click, rub or gallop Abdomen: soft, non-tender; bowel sounds normal; no masses,  no organomegaly Pulses: 2+ and symmetric Skin: Skin  color, texture, turgor normal. No rashes or lesions MSK:  No joint effusions,  Redness,   No midline spinal tenderness.  Some mild tenderness to palpation at the bilateral SI joint.   Lymph nodes: Cervical, supraclavicular, and axillary nodes normal.  Lab Results  Component Value Date   HGBA1C 5.1 10/28/2015    Lab Results  Component Value Date   CREATININE 0.66 11/18/2018   CREATININE 0.76 11/06/2017   CREATININE 0.74 11/01/2016    Lab Results  Component Value Date     WBC 5.2 11/01/2016   HGB 12.8 11/01/2016   HCT 37.3 11/01/2016   PLT 233 11/01/2016   GLUCOSE 87 11/18/2018   CHOL 176 11/18/2018   TRIG 78 11/18/2018   HDL 58 11/18/2018   LDLCALC 102 (H) 11/18/2018   ALT 22 11/18/2018   AST 27 11/18/2018   NA 137 11/18/2018   K 3.9 11/18/2018   CL 100 11/18/2018   CREATININE 0.66 11/18/2018   BUN 13 11/18/2018   CO2 22 11/18/2018   TSH 2.140 11/01/2016   HGBA1C 5.1 10/28/2015    No results found.  Assessment & Plan:   Problem List Items Addressed This Visit      Unprioritized   SI (sacroiliac) joint inflammation (HCC) - Primary    Likely brought on by [facility] work.  NSAID, muscle relaxer, and PT .  Modification of activities recommended.      Relevant Medications   meloxicam (MOBIC) 15 MG tablet   tiZANidine (ZANAFLEX) 4 MG capsule   Other Relevant Orders   Ambulatory referral to Physical Therapy      I am having Imagene L. Landowski start on meloxicam, tiZANidine, and omeprazole. I am also having her maintain her levonorgestrel, loratadine, cetirizine, Azelastine-Fluticasone, Biotin, Fish Oil, montelukast, and Vitamin D3.  Meds ordered this encounter  Medications  . meloxicam (MOBIC) 15 MG tablet    Sig: Take 1 tablet (15 mg total) by mouth daily.    Dispense:  30 tablet    Refill:  1  . tiZANidine (ZANAFLEX) 4 MG capsule    Sig: Take 1 capsule (4 mg total) by mouth 3 (three) times daily as needed for muscle spasms.    Dispense:  60 capsule    Refill:  1  . omeprazole (PRILOSEC) 20 MG capsule    Sig: Take 1 capsule (20 mg total) by mouth daily.    Dispense:  30 capsule    Refill:  3   I provided  30 minutes of  face-to-face time during this encounter reviewing patient's current problems and past surgeries, labs and imaging studies, providing counseling on the above mentioned problems , and coordination  of care .  There are no discontinued medications.  Follow-up: No follow-ups on file.   Sherlene Shams, MD

## 2020-07-13 NOTE — Patient Instructions (Addendum)
I am prescribing meloxicam once daily as your NSAID:  take with lunch or dinner  omeprazole once daily in the morning for (GI protection from the NSAID is recommended if using NSAID for > 1 week)   Save tizanidine for nighttime. It's a  muscle relaxer    I refilled Brandi Johnson's xanax .  Have him come see me/

## 2020-07-15 DIAGNOSIS — M461 Sacroiliitis, not elsewhere classified: Secondary | ICD-10-CM | POA: Insufficient documentation

## 2020-07-15 NOTE — Assessment & Plan Note (Signed)
Likely brought on by [employer] work.  NSAID, muscle relaxer, and PT .  Modification of activities recommended.

## 2020-07-26 ENCOUNTER — Other Ambulatory Visit: Payer: Self-pay

## 2020-07-26 ENCOUNTER — Ambulatory Visit: Payer: No Typology Code available for payment source

## 2020-07-26 DIAGNOSIS — M62838 Other muscle spasm: Secondary | ICD-10-CM | POA: Diagnosis present

## 2020-07-26 DIAGNOSIS — R293 Abnormal posture: Secondary | ICD-10-CM

## 2020-07-26 DIAGNOSIS — M533 Sacrococcygeal disorders, not elsewhere classified: Secondary | ICD-10-CM

## 2020-07-26 NOTE — Therapy (Signed)
[place] [facility] MAIN [facility] SERVICES [address] [place], [place], [address] Phone: [phone]   Fax:  [phone]  Physical Therapy Evaluation  The patient has been informed of current processes in place at Outpatient Rehab to protect patients from Covid-19 exposure including social distancing, schedule modifications, and new cleaning procedures. After discussing their particular risk with a therapist based on the patient's personal risk factors, the patient has decided to proceed with in-person therapy.  Patient Details  Name: Brandi Johnson MRN: [id number] Date of Birth: [birth year] No data recorded  Encounter Date: 07/26/2020   PT End of Session - 07/27/20 1308    Visit Number 1    Number of Visits 10    Date for PT Re-Evaluation 10/05/20    Authorization Type UMR    Authorization Time Period 07/26/20 through 10/05/20    Authorization - Visit Number 1    Authorization - Number of Visits 10    Progress Note Due on Visit 10    PT Start Time 1600    PT Stop Time 1700    PT Time Calculation (min) 60 min    Activity Tolerance Patient tolerated treatment well;No increased pain    Behavior During Therapy WFL for tasks assessed/performed           Past Medical History:  Diagnosis Date  . AR (allergic rhinitis)   . Hemorrhoids in pregnancy   . History of renal calculi    with hydroneprosis noted during pregnancy  . Hydronephrosis   . Motion sickness   . UTI (lower urinary tract infection)     Past Surgical History:  Procedure Laterality Date  . CESAREAN SECTION  2012    There were no vitals filed for this visit.        Pelvic Floor Physical Therapy Evaluation.  SCREENING  Falls in last 6 mo: no  Patient's communication preference:   Red Flags:  Have you had any night sweats? no Unexplained weight loss? no Saddle anesthesia? no Unexplained changes in bowel or bladder habits? no  SUBJECTIVE  Patient reports: Started  around April/May she noticed that the LB hurts when mopping. June 6th she was doing a lunge to kick exercise and felt like she pulled a muscle which resolved with heat and rest but now the LBP is more frequent feeling it with every day activities and bending Mostly having pain/discomfort when lying on her stomach and sporadically throughout the day. Stiffness in the morning.  ~ 2-3 years ago she thinks she may have fractured her tailbone.  Precautions:  none  Social/Family/Vocational History:   working full time doing mammography  Recent Procedures/Tests/Findings:  none  Obstetrical History: 2 c-sections 2012, 2015  Gynecological History: none Urinary History: none  Gastrointestinal History: None  Sexual activity/pain: none  Location of pain: B SIJ/ LB Current pain:  1/10  Max pain:  4/10 Least pain:  0/10 Nature of pain: dull achy with sharp stabs  Patient Goals: Sleep and mop without pain   OBJECTIVE  Posture/Observations:  Sitting:  Standing: R handed, 5" 1' very slightly high on L ASIS  Palpation/Segmental Motion/Joint Play: TTP to B iliacus and Psoas.  Special tests:   Scoliosis: very slight R thoracolumbar curve (prominent R transverse processes) LLD: RLE 78 cm, LLE: 79cm    Range of Motion/Flexibilty:  Spine: R SB pain in L hip but WNL, L SB no pain and WNL, L ROT limited slightly with L SIJ discomfort, R ROT WNL. Forward  bend ~ 2 in. From floor, no pain. Hips: Supine-to-long-sit: RLE anteriorly rotated.  Strength/MMT: Deferred to follow-up LE MMT  LE MMT Left Right  Hip flex:  (L2) /5 /5  Hip ext: /5 /5  Hip abd: /5 /5  Hip add: /5 /5  Hip IR /5 /5  Hip ER /5 /5     Abdominal:  Palpation: Diastasis: 1 finger below umbilicus, narrows with chin-tuck, not leg-lift  Pelvic Floor External Exam: Deferred to follow-up Introitus Appears:  Skin integrity:  Palpation: Cough: Prolapse visible?: Scar mobility:  Internal Vaginal  Exam: Strength (PERF):  Symmetry: Palpation: Prolapse:  Gait Analysis: WNL, not fully assessed   Pelvic Floor Outcome Measures: not assessed  INTERVENTIONS THIS SESSION: Manual: performed TP release to B Iliacus to decrease spasm and pain and allow for improved balance of musculature for improved function and decreased symptoms.  Dry-needle: Performed TPDN with a .30x74mm needle and standard approach as described below to decrease spasm and pain and allow for improved balance of musculature for improved function and decreased symptoms.  Therex: educated on and practiced posterior pelvic tilts in hook-lying with emphasis on recruiting the TA, then the glutes, then the obliques to improve strength of muscles opposing tight musculature to allow reciprocal inhibition to improve balance of musculature surrounding the pelvis and improve overall posture for optimal musculature length-tension relationship and function. Educated on LB stretch in sitting and standing To maintain and improve muscle length and allow for improved balance of musculature for long-term symptom relief.  Total time: 60 min.            Objective measurements completed on examination: See above findings.         Trigger Point Dry Needling - 07/27/20 0001    Consent Given? Yes    Education Handout Provided No    Muscles Treated Back/Hip Iliacus    Dry Needling Comments Bilat    Iliacus Response Twitch response elicited;Palpable increased muscle length                  PT Short Term Goals - 07/27/20 1319      PT SHORT TERM GOAL #1   Title Patient will demonstrate a coordinated contraction, relaxation, and bulge of the pelvic floor muscles to demonstrate functional recruitment and motion and allow for decreased urinary hesitancy.    Baseline Pt. has small weak stream and takes a long time to empty bladder.    Time 5    Period Weeks    Status New    Target Date 08/31/20      PT SHORT TERM GOAL  #2   Title Patient will demonstrate improved pelvic alignment and balance of musculature surrounding the pelvis to facilitate decreased PFM spasms and decrease pelvic pain.    Baseline R anterior rotation and hyperlordosis/kyphisis as well as mild R thoracolumbar scoliosis.    Time 5    Period Weeks    Status New    Target Date 08/31/20             PT Long Term Goals - 07/27/20 1322      PT LONG TERM GOAL #1   Title Patient will report ability to consistently empty the bladder in less than 1 minute over the course of the prior two weeks to demonstrate improved functional ability.    Baseline Urine stream stops and starts and is small/weak.    Time 10    Period Weeks    Status New  Target Date 10/05/20      PT LONG TERM GOAL #2   Title Patient will describe pain no greater than 1/10 during moderate-to-high-level  exercise for 45 min. to demonstrate improved functional ability.    Baseline Having pain when mopping, bending, etc.    Time 10    Period Weeks    Status New    Target Date 10/05/20                  Plan - 07/27/20 1309    Clinical Impression Statement Pt. is a 34 y/o female who presents today with cheif c/o hip pain and urinary hesitancy. Her PMH is significant for 2 c-sections and possible tailbone fracture ~ 2-3 years ago. Her clinical exam revealed a mild R anterior innominate rotation and hyperlordosis with spasms through the anterior R>L hip and difficulty recruiting the deep-core as well as history that suggests likely high PFM tone. She will benefit from skilled pelvic health PT to address the noted deficits and to continue to assess for and address any other potential causes of Sx.    Personal Factors and Comorbidities Comorbidity 1    Comorbidities 2 c-sections    Examination-Activity Limitations Toileting;Bend;Lift;Squat    Examination-Participation Restrictions Cleaning;Other;Yard Work   exercise   Stability/Clinical Decision Making  Stable/Uncomplicated    Clinical Decision Making Low    Rehab Potential Excellent    PT Frequency 1x / week    PT Duration Other (comment)   10 weeks   PT Treatment/Interventions ADLs/Self Care Home Management;Biofeedback;Electrical Stimulation;Moist Heat;Gait training;Stair training;Functional mobility training;Therapeutic activities;Neuromuscular re-education;Therapeutic exercise;Patient/family education;Scar mobilization;Manual techniques;Passive range of motion;Dry needling;Taping;Spinal Manipulations;Joint Manipulations    PT Next Visit Plan review and progress deep-core strengthening. Assess PFM for high tone.    PT Home Exercise Plan low back stretch, posterior pelvic tilt with TA/obliques    Consulted and Agree with Plan of Care Patient           Patient will benefit from skilled therapeutic intervention in order to improve the following deficits and impairments:  Increased muscle spasms, Improper body mechanics, Impaired tone, Decreased scar mobility, Decreased coordination, Decreased strength, Pain, Postural dysfunction  Visit Diagnosis: No diagnosis found.     Problem List Patient Active Problem List   Diagnosis Date Noted  . SI (sacroiliac) joint inflammation (HCC) 07/15/2020  . Upper respiratory infection 04/02/2020  . Allergic rhinitis 01/09/2020  . Weak urinary stream 01/05/2019  . Attention deficit disorder (ADD) 10/25/2015  . Family history of hemochromatosis 05/25/2013  . History of renal calculi    Cleophus Molt DPT, ATC Cleophus Molt 07/27/2020, 1:27 PM  [place] [facility] MAIN [facility] SERVICES [address] [place], [place], [address] Phone: [phone]   Fax:  [phone]  Name: MCKENSIE SCOTTI MRN: [id number] Date of Birth: March 11, 1986

## 2020-07-26 NOTE — Patient Instructions (Signed)
° °  Tuck your hips under, then keep the tuck as you lean forward so you feel a stretch through your low back. Hold for 5 deep breaths, repeat 2-3 times, 1-2 times per day.   Pelvic Tilt With Pelvic Floor (Hook-Lying)        Lie with hips and knees bent. Exhale and feel the low tummy muscles pull in, followed by the glutes and then feel the ribs drawing together as you flatten the low back so that pelvis tilts slightly. Repeat _2x10__ times. Do _1-2__ times a day.

## 2020-08-09 ENCOUNTER — Other Ambulatory Visit: Payer: Self-pay

## 2020-08-09 ENCOUNTER — Ambulatory Visit: Payer: No Typology Code available for payment source | Attending: Internal Medicine

## 2020-08-09 DIAGNOSIS — R293 Abnormal posture: Secondary | ICD-10-CM | POA: Insufficient documentation

## 2020-08-09 DIAGNOSIS — M533 Sacrococcygeal disorders, not elsewhere classified: Secondary | ICD-10-CM | POA: Diagnosis present

## 2020-08-09 DIAGNOSIS — M62838 Other muscle spasm: Secondary | ICD-10-CM | POA: Diagnosis present

## 2020-08-09 NOTE — Therapy (Signed)
[employer] MAIN [facility] SERVICES [address] [place], [place], [address] Phone: [phone]   Fax:  [phone]  Physical Therapy Treatment  The patient has been informed of current processes in place at Outpatient Rehab to protect patients from Covid-19 exposure including social distancing, schedule modifications, and new cleaning procedures. After discussing their particular risk with a therapist based on the patient's personal risk factors, the patient has decided to proceed with in-person therapy.  Patient Details  Name: Brandi Johnson MRN: [id number] Date of Birth: Dec 30, 1986 No data recorded  Encounter Date: 08/09/2020   PT End of Session - 08/11/20 1207    Visit Number 2    Number of Visits 10    Date for PT Re-Evaluation 10/05/20    Authorization Type UMR    Authorization Time Period 07/26/20 through 10/05/20    Authorization - Visit Number 2    Authorization - Number of Visits 10    Progress Note Due on Visit 10    PT Start Time 1630    PT Stop Time 1730    PT Time Calculation (min) 60 min    Activity Tolerance Patient tolerated treatment well;No increased pain    Behavior During Therapy WFL for tasks assessed/performed           Past Medical History:  Diagnosis Date  . AR (allergic rhinitis)   . Hemorrhoids in pregnancy   . History of renal calculi    with hydroneprosis noted during pregnancy  . Hydronephrosis   . Motion sickness   . UTI (lower urinary tract infection)     Past Surgical History:  Procedure Laterality Date  . CESAREAN SECTION  2012    There were no vitals filed for this visit.   Pelvic Floor Physical Therapy Treatment  SCREENING  Changes in Medical history, allergies or medications:  SUBJECTIVE  Patient reports: Has only had to get up 2 times in the middle of the night to pee rather than every night like it was.  Precautions:  none  Location of pain: B SIJ/ LB Current pain:  0/10  Max  pain:  1/10 Least pain:  0/10 Nature of pain: dull achy with sharp stabs  ** no pain following treatment.  Patient Goals: Sleep and mop without pain   OBJECTIVE  Posture/Observations:  R PSIS high, R ASIS low in standing.  Palpation/Segmental Motion/Joint Play: TTP to R iliacus   Special tests:   Scoliosis: very slight R thoracolumbar curve (prominent R transverse processes) LLD: RLE 78 cm, LLE: 79cm  (from prior assessment) Suoine-to-long-sit: RLE slightly long in lying.  **Pelvis level following treatment.  Range of Motion/Flexibilty:   Abdominal:  Palpation: Diastasis: 1 finger below umbilicus, narrows with chin-tuck, not leg-lift  Pelvic Floor External Exam: Deferred to follow-up Introitus Appears:  Skin integrity:  Palpation: Cough: Prolapse visible?: Scar mobility:  Internal Vaginal Exam: Strength (PERF):  Symmetry: Palpation: Prolapse:  Gait Analysis: WNL, not fully assessed  INTERVENTIONS THIS SESSION: Manual: performed TP release and STM to R Iliacus followed by MET correction x2 to decrease spasm and pain and allow for improved pelvic alignment and balance of musculature for improved function and decreased symptoms.  Dry-needle: Performed TPDN with a .30x57m needle and standard approach as described below to decrease spasm and pain and allow for improved balance of musculature for improved function and decreased symptoms.  Therex: educated on and practiced donkey kicks in quad, dorothy's, hip-flexor stretch, self TP release with tennis ball to  QL and hip-flexors to improve strength of muscles opposing tight musculature to allow reciprocal inhibition to improve balance of musculature surrounding the pelvis and improve overall posture for optimal musculature length-tension relationship and function.   Total time: 60 min.                     Trigger Point Dry Needling - 08/11/20 0001    Consent Given? Yes    Education Handout  Provided No    Muscles Treated Back/Hip Iliacus    Dry Needling Comments R    Iliacus Response Twitch response elicited;Palpable increased muscle length                  PT Short Term Goals - 08/09/20 1652      PT SHORT TERM GOAL #1   Title Patient will demonstrate a coordinated contraction, relaxation, and bulge of the pelvic floor muscles to demonstrate functional recruitment and motion and allow for decreased urinary hesitancy.    Baseline Pt. has small weak stream and takes a long time to empty bladder.    Time 5    Period Weeks    Status Achieved    Target Date 08/31/20      PT SHORT TERM GOAL #2   Title Patient will demonstrate improved pelvic alignment and balance of musculature surrounding the pelvis to facilitate decreased PFM spasms and decrease pelvic pain.    Baseline R anterior rotation and hyperlordosis/kyphisis as well as mild R thoracolumbar scoliosis.    Time 5    Period Weeks    Status New    Target Date 08/31/20             PT Long Term Goals - 07/27/20 1322      PT LONG TERM GOAL #1   Title Patient will report ability to consistently empty the bladder in less than 1 minute over the course of the prior two weeks to demonstrate improved functional ability.    Baseline Urine stream stops and starts and is small/weak.    Time 10    Period Weeks    Status New    Target Date 10/05/20      PT LONG TERM GOAL #2   Title Patient will describe pain no greater than 1/10 during moderate-to-high-level  exercise for 45 min. to demonstrate improved functional ability.    Baseline Having pain when mopping, bending, etc.    Time 10    Period Weeks    Status New    Target Date 10/05/20                 Plan - 08/11/20 1208    Clinical Impression Statement Pt. Responded well to all interventions today, demonstrating improved pelvic alignment, decreased spasm and improved recruitment of deep-core and glutes as well as understanding and correct  performance of all education and exercises provided today. They will continue to benefit from skilled physical therapy to work toward remaining goals and maximize function as well as decrease likelihood of symptom increase or recurrence.    Personal Factors and Comorbidities Comorbidity 1    Comorbidities 2 c-sections    Examination-Activity Limitations Toileting;Bend;Lift;Squat    Examination-Participation Restrictions Cleaning;Other;Yard Work   exercise   Stability/Clinical Decision Making Stable/Uncomplicated    Rehab Potential Excellent    PT Frequency 1x / week    PT Duration Other (comment)   10 weeks   PT Treatment/Interventions ADLs/Self Care Home Management;Biofeedback;Electrical Stimulation;Moist Heat;Gait training;Stair training;Functional mobility training;Therapeutic activities;Neuromuscular  re-education;Therapeutic exercise;Patient/family education;Scar mobilization;Manual techniques;Passive range of motion;Dry needling;Taping;Spinal Manipulations;Joint Manipulations    PT Next Visit Plan review and progress deep-core strengthening. Assess PFM for high tone.    PT Home Exercise Plan low back stretch, posterior pelvic tilt with TA/obliques, hip-flexor stretch, prone dorothy's, hip EXT in quad    Consulted and Agree with Plan of Care Patient           Patient will benefit from skilled therapeutic intervention in order to improve the following deficits and impairments:  Increased muscle spasms, Improper body mechanics, Impaired tone, Decreased scar mobility, Decreased coordination, Decreased strength, Pain, Postural dysfunction  Visit Diagnosis: Abnormal posture  Sacrococcygeal disorders, not elsewhere classified  Other muscle spasm     Problem List Patient Active Problem List   Diagnosis Date Noted  . SI (sacroiliac) joint inflammation ([place]) 07/15/2020  . Upper respiratory infection 04/02/2020  . Allergic rhinitis 01/09/2020  . Weak urinary stream 01/05/2019  .  Attention deficit disorder (ADD) 10/25/2015  . Family history of hemochromatosis 05/25/2013  . History of renal calculi    Willa Rough DPT, ATC Willa Rough 08/11/2020, 12:23 PM  [place] MAIN [facility] SERVICES [address] [place], [place], [address] Phone: [phone]   Fax:  [phone]  Name: Brandi Johnson MRN: [id number] Date of Birth: [birth year]

## 2020-08-09 NOTE — Patient Instructions (Addendum)
° °   This is The QL muscle  To perform release on this muscle, start by getting into this position by bridging the hips up and then slowly lowering your back, then your butt down to lengthen the low back then put the ball under you where you feel the tender spot and roll to the same side slightly to add pressure as needed. Hold still and take deep breaths until the pain is at least 50% less or, ideally, just pressure.   This is your piriformis    To release this muscle start in this position with your ankle crossed over the opposite knee. Place the tennis ball under your buttock where the tender spot is and then slightly roll your weight to the same side to put just enough pressure that it is uncomfortable. Hold and take deep breaths until the pain is at least 50% less or, ideally ,just pressure.   This is your Gluteus Medius and Minimus  To perform release on this muscle, start by getting into this position by bridging the hips up and then slowly lowering your back, then your butt down to lengthen the low back then put the ball under you where you feel the tender spot and roll to the same side slightly to add pressure as needed. Hold still and take deep breaths until the pain is at least 50% less or, ideally, just pressure.   These are your deep hip-flexor muscles. They are easiest to reach where they come together at the hip.   To perform release on this muscle, start by getting into this position by laying on your stomach then put the ball under you where you feel the tender spot and bring the opposite knee up/out to the side to add pressure as needed. Hold still and take deep breaths until the pain is at least 50% less or, ideally ,just pressure.      Bring knees wide, heels together and toes apart while lying on your stomach. Gently squeeze the heels together and hold for ~ 5 sec. Repeat 10 times, 1 time per day.   Flexors, Lunge  Hip Flexor Stretch: Proposal Pose    Maintain pelvic  tuck under, lift pubic bone toward navel. Engage posterior hip muscles (firm glute muscles of leg in back position) and shift forward until you feel stretch on front of leg that is down. To increase stretch, maintain balance and ease hips forward. You may use one hand on a chair for balance if needed. Hold for __5__ breaths. Repeat __2-3__ times each leg. (more on R) Do _1-2__ times per day.   Exhale and extend the hip but be careful not to overextend and use low back. Do 3x10 on R, 2x10 on L.

## 2020-08-15 ENCOUNTER — Ambulatory Visit: Payer: No Typology Code available for payment source

## 2020-08-16 ENCOUNTER — Encounter: Payer: Self-pay | Admitting: Internal Medicine

## 2020-08-16 ENCOUNTER — Telehealth (INDEPENDENT_AMBULATORY_CARE_PROVIDER_SITE_OTHER): Payer: No Typology Code available for payment source | Admitting: Internal Medicine

## 2020-08-16 DIAGNOSIS — Z23 Encounter for immunization: Secondary | ICD-10-CM

## 2020-08-16 DIAGNOSIS — Z7189 Other specified counseling: Secondary | ICD-10-CM | POA: Diagnosis not present

## 2020-08-16 NOTE — Progress Notes (Addendum)
Virtual Visit via Caregility  This visit type was conducted due to national recommendations for restrictions regarding the COVID-19 pandemic (e.g. social distancing).  This format is felt to be most appropriate for this patient at this time.  All issues noted in this document were discussed and addressed.  No physical exam was performed (except for noted visual exam findings with Video Visits).   I connected with@ on 08/16/20 at  4:00 PM EDT by a video enabled telemedicine applicationand verified that I am speaking with the correct person using two identifiers. Location patient: home Location provider: work or home office Persons participating in the virtual visit: patient, provider  I discussed the limitations, risks, security and privacy concerns of performing an evaluation and management service by telephone and the availability of in person appointments. I also discussed with the patient that there may be a patient responsible charge related to this service. The patient expressed understanding and agreed to proceed.   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  She is concerned about the long term potential risks of mRNA vaccine therapy and is considering the [employer] vaccine with aspirin prophylaxis.  She is not taking oral contraception but has a Mirena IUD.  She has no risk factors for Thrombotic events.    ROS: See pertinent positives and negatives per HPI.  Past Medical History:  Diagnosis Date  . AR (allergic rhinitis)   . Hemorrhoids in pregnancy   . History of renal calculi    with hydroneprosis noted during pregnancy  . Hydronephrosis   . Motion sickness   . UTI (lower urinary tract infection)     Past Surgical History:  Procedure Laterality Date  . CESAREAN SECTION  2012    Family History  Problem Relation Age of Onset  . Mental illness Mother   . Colitis  Mother        collagenous  . Colon polyps Mother   . Heart disease Father 49       CAD s/p CABG  . Diabetes Father   . Skin cancer Father   . Diabetes Maternal Grandmother   . Hyperlipidemia Maternal Grandmother   . Stroke Maternal Grandmother   . Stroke Paternal Grandmother   . Celiac disease Sister   . Cancer Neg Hx     SOCIAL HX:  reports that she has never smoked. She has never used smokeless tobacco. She reports that she does not drink alcohol and does not use drugs.   Current Outpatient Medications:  .  azelastine (ASTELIN) 0.1 % nasal spray, Place into both nostrils., Disp: , Rfl:  .  Biotin 10 MG CAPS, Take 10 mg by mouth daily. , Disp: , Rfl:  .  cetirizine (ZYRTEC) 10 MG tablet, Take 1 tablet (10 mg total) daily by mouth., Disp: 90 tablet, Rfl: 4 .  Cholecalciferol (VITAMIN D3) 1.25 MG (50000 UT) TABS, Take by mouth., Disp: , Rfl:  .  levonorgestrel (MIRENA) 20 MCG/24HR IUD, 1 each by Intrauterine route once., Disp: , Rfl:  .  loratadine (CLARITIN) 10 MG tablet, Take 1 tablet (10 mg total) daily by mouth. (Patient taking differently: Take 10 mg by mouth as needed. ), Disp: 120 tablet, Rfl: 4 .  meclizine (ANTIVERT) 25 MG tablet, Take 25 mg by mouth daily as needed., Disp: , Rfl:  .  meloxicam (MOBIC) 15 MG tablet, Take 1 tablet (15 mg  total) by mouth daily., Disp: 30 tablet, Rfl: 1 .  Omega-3 Fatty Acids (FISH OIL) 1000 MG CAPS, Take by mouth., Disp: , Rfl:  .  omeprazole (PRILOSEC) 20 MG capsule, Take 1 capsule (20 mg total) by mouth daily., Disp: 30 capsule, Rfl: 3 .  tiZANidine (ZANAFLEX) 4 MG capsule, Take 1 capsule (4 mg total) by mouth 3 (three) times daily as needed for muscle spasms., Disp: 60 capsule, Rfl: 1  EXAM:  VITALS per patient if applicable:  GENERAL: alert, oriented, appears well and in no acute distress  HEENT: atraumatic, conjunttiva clear, no obvious abnormalities on inspection of external nose and ears  NECK: normal movements of the head and  neck  LUNGS: on inspection no signs of respiratory distress, breathing rate appears normal, no obvious gross SOB, gasping or wheezing  CV: no obvious cyanosis  MS: moves all visible extremities without noticeable abnormality  PSYCH/NEURO: pleasant and cooperative, no obvious depression or anxiety, speech and thought processing grossly intact  ASSESSMENT AND PLAN:  Discussed the following assessment and plan:  Advice given about COVID-19 virus infection  Advice given about COVID-19 virus infection Patient's concerns about the risks of vaccine discussed . Agree with the [employer] vaccine choice with use of aspirin as prophylaxis for 4 weeks (starting one week prior)     I discussed the assessment and treatment plan with the patient. The patient was provided an opportunity to ask questions and all were answered. The patient agreed with the plan and demonstrated an understanding of the instructions.   The patient was advised to call back or seek an in-person evaluation if the symptoms worsen or if the condition fails to improve as anticipated.  I provided 20 minutes of non-face-to-face time during this encounter.   Sherlene Shams, MD

## 2020-08-16 NOTE — Assessment & Plan Note (Addendum)
Patient's concerns about the risks of vaccine discussed . Agree with the [employer] vaccine choice with use of aspirin as prophylaxis for 4 weeks (starting one week prior)

## 2020-08-18 ENCOUNTER — Telehealth: Payer: Self-pay | Admitting: Internal Medicine

## 2020-08-18 DIAGNOSIS — R5383 Other fatigue: Secondary | ICD-10-CM

## 2020-08-18 NOTE — Telephone Encounter (Signed)
My Chart message sent

## 2020-08-23 ENCOUNTER — Other Ambulatory Visit: Payer: Self-pay

## 2020-08-23 ENCOUNTER — Ambulatory Visit: Payer: No Typology Code available for payment source

## 2020-08-23 DIAGNOSIS — M533 Sacrococcygeal disorders, not elsewhere classified: Secondary | ICD-10-CM

## 2020-08-23 DIAGNOSIS — R293 Abnormal posture: Secondary | ICD-10-CM | POA: Diagnosis not present

## 2020-08-23 DIAGNOSIS — M62838 Other muscle spasm: Secondary | ICD-10-CM

## 2020-08-23 NOTE — Patient Instructions (Addendum)
°  When seated, you want to maintain pelvic neutral with the shoulders gently down and back and ears in line with your shoulders. A lumbar roll such as the one below or a home-made towel-roll can be used for this purpose. Even Olympic athletes can only maintain proper seated posture for about 10 minutes without support!    Pictured: The Original [employer] Lumbar Roll   Do 10 steps in one direction and return. Do this 3 times total, 1-2 times per day.  When you are doing your squats, put the blue band around your knees and press out into it to keep the knees in line with the toes.   Mini-Marches    Exhale, drawing the lower tummy (TA) in toward the back bone and hold contraction while you lift one foot ~ 2 inches off the mat, then the other foot before relaxing and resetting. Try to keep your hips from rocking, using your hands to sense whether they are staying even as pictured.      Perform _15__ repetitions for _2__ sets. Do this _1-2_ times per day.

## 2020-08-23 NOTE — Therapy (Signed)
[place] [facility] MAIN [facility] SERVICES [address] [place], [place], [address] Phone: [phone]   Fax:  [phone]  Physical Therapy Treatment  The patient has been informed of current processes in place at Outpatient Rehab to protect patients from Covid-19 exposure including social distancing, schedule modifications, and new cleaning procedures. After discussing their particular risk with a therapist based on the patient's personal risk factors, the patient has decided to proceed with in-person therapy.   Patient Details  Name: Brandi Johnson MRN: [id number] Date of Birth: 06/14/1986 No data recorded  Encounter Date: 08/23/2020   PT End of Session - 08/24/20 0857    Visit Number 3    Number of Visits 10    Date for PT Re-Evaluation 10/05/20    Authorization Type UMR    Authorization Time Period 07/26/20 through 10/05/20    Authorization - Visit Number 3    Authorization - Number of Visits 10    Progress Note Due on Visit 10    PT Start Time 1630    PT Stop Time 1730    PT Time Calculation (min) 60 min    Activity Tolerance Patient tolerated treatment well;No increased pain    Behavior During Therapy WFL for tasks assessed/performed           Past Medical History:  Diagnosis Date  . AR (allergic rhinitis)   . Hemorrhoids in pregnancy   . History of renal calculi    with hydroneprosis noted during pregnancy  . Hydronephrosis   . Motion sickness   . UTI (lower urinary tract infection)     Past Surgical History:  Procedure Laterality Date  . CESAREAN SECTION  2012    There were no vitals filed for this visit.  Pelvic Floor Physical Therapy Treatment  SCREENING  Changes in Medical history, allergies or medications:  SUBJECTIVE  Patient reports: Currently in no pain. If she does get get pain it is in the L SIJ and feels tight laterally from there. Feels like she has noticed some bladder/PFM spasms  occasionally.  Precautions:  none  Location of pain: B SIJ/ LB Current pain:  0/10  Max pain:  1/10 Least pain:  0/10 Nature of pain: dull achy with sharp stabs  ** no increased pain following treatment.  Patient Goals: Sleep and mop without pain   OBJECTIVE  Posture/Observations:  R PSIS high, R ASIS low in standing. (from prior session) not re-assessed today.  Palpation/Segmental Motion/Joint Play:  Special tests:   Scoliosis: very slight R thoracolumbar curve (prominent R transverse processes) LLD: RLE 78 cm, LLE: 79cm  (from prior assessment)  Range of Motion/Flexibilty:   Abdominal:  Palpation: Diastasis: 1 finger below umbilicus, narrows with chin-tuck, not leg-lift  Pelvic Floor External Exam:  Introitus Appears: elevated Skin integrity: WNL Palpation: TTP to R STP Cough: parodoxical Prolapse visible?: no Scar mobility: N/A  Internal Vaginal Exam: Strength (PERF): Pt. Unable to coordinate squeeze, demonstrates bearing down. Able to get ~ 2/5 when cued for TA recruitment only. Symmetry: R>L and anterior>posterior for TTP Palpation: TTP through all regions  Prolapse: none  Gait Analysis: WNL, not fully assessed  INTERVENTIONS THIS SESSION: Manual: Assessed PFM and performed TP release to all regions on the R and to anterior PR/PC on the L followed by verbal cueing to improve coordination of PFM with kegel.  Therex: Reviewed posterior pelvic tilts and progressed to mini-marches, Educated on and practiced monster-walks and squats with a band just above the  knees to improve Glute Med engagement and strength and to improve strength of muscles opposing tight musculature to allow reciprocal inhibition to improve balance of musculature surrounding the pelvis and improve overall posture for optimal musculature length-tension relationship and function.  Total time: 60 min.                              PT Short Term Goals - 08/09/20 1652       PT SHORT TERM GOAL #1   Title Patient will demonstrate a coordinated contraction, relaxation, and bulge of the pelvic floor muscles to demonstrate functional recruitment and motion and allow for decreased urinary hesitancy.    Baseline Pt. has small weak stream and takes a long time to empty bladder.    Time 5    Period Weeks    Status Achieved    Target Date 08/31/20      PT SHORT TERM GOAL #2   Title Patient will demonstrate improved pelvic alignment and balance of musculature surrounding the pelvis to facilitate decreased PFM spasms and decrease pelvic pain.    Baseline R anterior rotation and hyperlordosis/kyphisis as well as mild R thoracolumbar scoliosis.    Time 5    Period Weeks    Status New    Target Date 08/31/20             PT Long Term Goals - 07/27/20 1322      PT LONG TERM GOAL #1   Title Patient will report ability to consistently empty the bladder in less than 1 minute over the course of the prior two weeks to demonstrate improved functional ability.    Baseline Urine stream stops and starts and is small/weak.    Time 10    Period Weeks    Status New    Target Date 10/05/20      PT LONG TERM GOAL #2   Title Patient will describe pain no greater than 1/10 during moderate-to-high-level  exercise for 45 min. to demonstrate improved functional ability.    Baseline Having pain when mopping, bending, etc.    Time 10    Period Weeks    Status New    Target Date 10/05/20                 Plan - 08/24/20 0858    Clinical Impression Statement Pt. Responded well to all interventions today, demonstrating decreased PFM spasm and improved coordination/recruitment, as well as understanding and correct performance of all education and exercises provided today. They will continue to benefit from skilled physical therapy to work toward remaining goals and maximize function as well as decrease likelihood of symptom increase or recurrence.    Personal Factors and  Comorbidities Comorbidity 1    Comorbidities 2 c-sections    Examination-Activity Limitations Toileting;Bend;Lift;Squat    Examination-Participation Restrictions Cleaning;Other;Yard Work   exercise   Stability/Clinical Decision Making Stable/Uncomplicated    Rehab Potential Excellent    PT Frequency 1x / week    PT Duration Other (comment)   10 weeks   PT Treatment/Interventions ADLs/Self Care Home Management;Biofeedback;Electrical Stimulation;Moist Heat;Gait training;Stair training;Functional mobility training;Therapeutic activities;Neuromuscular re-education;Therapeutic exercise;Patient/family education;Scar mobilization;Manual techniques;Passive range of motion;Dry needling;Taping;Spinal Manipulations;Joint Manipulations    PT Next Visit Plan review and progress deep-core strengthening. Assess PFM for high tone.    PT Home Exercise Plan low back stretch, posterior pelvic tilt with TA/obliques, hip-flexor stretch, prone dorothy's, hip EXT in quad, monster walks and  squats with blue band, lumbar support.    Consulted and Agree with Plan of Care Patient           Patient will benefit from skilled therapeutic intervention in order to improve the following deficits and impairments:  Increased muscle spasms, Improper body mechanics, Impaired tone, Decreased scar mobility, Decreased coordination, Decreased strength, Pain, Postural dysfunction  Visit Diagnosis: Abnormal posture  Sacrococcygeal disorders, not elsewhere classified  Other muscle spasm     Problem List Patient Active Problem List   Diagnosis Date Noted  . High priority for COVID-19 virus vaccination 08/16/2020  . SI (sacroiliac) joint inflammation (HCC) 07/15/2020  . Upper respiratory infection 04/02/2020  . Allergic rhinitis 01/09/2020  . Weak urinary stream 01/05/2019  . Attention deficit disorder (ADD) 10/25/2015  . Family history of hemochromatosis 05/25/2013  . History of renal calculi    Cleophus Molt DPT,  ATC Cleophus Molt 08/24/2020, 9:09 AM  [facility] MAIN [facility] SERVICES [address] [place], [place], [address] Phone: [phone]   Fax:  [phone]  Name: Brandi Johnson MRN: [id number] Date of Birth: December 16, 1986

## 2020-08-24 NOTE — Addendum Note (Signed)
Addended by: Sherlene Shams on: 08/24/2020 08:21 PM   Modules accepted: Orders

## 2020-08-29 ENCOUNTER — Other Ambulatory Visit: Payer: Self-pay

## 2020-08-29 ENCOUNTER — Ambulatory Visit: Payer: No Typology Code available for payment source

## 2020-08-29 DIAGNOSIS — M62838 Other muscle spasm: Secondary | ICD-10-CM

## 2020-08-29 DIAGNOSIS — R293 Abnormal posture: Secondary | ICD-10-CM

## 2020-08-29 DIAGNOSIS — M533 Sacrococcygeal disorders, not elsewhere classified: Secondary | ICD-10-CM

## 2020-08-29 NOTE — Patient Instructions (Addendum)
As you start wearing your heel-lift only wear it for an hour the first day and increase by an hour each day so you can allow for the body to adapt to the change easily without much pain. If your pain increases by more than 1-2 points, back off slightly or slow down how quickly you increase your wear time. Once you reach a full day of wear, use it as much as possible forever, even in house-shoes or flip-flops if necessary to keep yourself from reverting to bad pelvic and spinal alignment and having symptoms return.    Adjust-a-lift heel lift Can be found at [employer]  Intimate Rose internal trigger point release tool [phone]  Dr. Alonna Minium Premium Prostate Massager   [phone] Self External Trigger Point Relief    1) Wash your hands and prop yourself up in a way where you can easily reach the vagina. You may wish to have a small hand-held mirror near by.  2) Use the 2 middle fingers to put gentle pressure on the three external pelvic floor muscles and hold pressure and take deep breaths as you allow the tension to release and discomfort to dissipate   3) Repeat the process for any trigger points you find spending between 3-10 minutes on this every 1-2 days until you do not find any more trigger points or you are told otherwise by your therapist.

## 2020-08-29 NOTE — Therapy (Signed)
[place] [facility] MAIN [facility] SERVICES [address] [place], [place], [address] Phone: [phone]   Fax:  [phone]  Physical Therapy Treatment  The patient has been informed of current processes in place at Outpatient Rehab to protect patients from Covid-19 exposure including social distancing, schedule modifications, and new cleaning procedures. After discussing their particular risk with a therapist based on the patient's personal risk factors, the patient has decided to proceed with in-person therapy.   Patient Details  Name: Brandi Johnson MRN: [id number] Date of Birth: 1986/08/09 No data recorded  Encounter Date: 08/29/2020   PT End of Session - 08/30/20 1537    Visit Number 4    Number of Visits 10    Date for PT Re-Evaluation 10/05/20    Authorization Type UMR    Authorization Time Period 07/26/20 through 10/05/20    Authorization - Visit Number 4    Authorization - Number of Visits 10    Progress Note Due on Visit 10    PT Start Time 1630    PT Stop Time 1730    PT Time Calculation (min) 60 min    Activity Tolerance Patient tolerated treatment well;No increased pain    Behavior During Therapy WFL for tasks assessed/performed           Past Medical History:  Diagnosis Date  . AR (allergic rhinitis)   . Hemorrhoids in pregnancy   . History of renal calculi    with hydroneprosis noted during pregnancy  . Hydronephrosis   . Motion sickness   . UTI (lower urinary tract infection)     Past Surgical History:  Procedure Laterality Date  . CESAREAN SECTION  2012    There were no vitals filed for this visit.   Pelvic Floor Physical Therapy Treatment  SCREENING  Changes in Medical history, allergies or medications:  SUBJECTIVE  Patient reports: Has some pain at night in the L SIJ still  Precautions:  none  Location of pain: L SIJ Current pain:  0/10  Max pain:  2/10 Least pain:  0/10 Nature of pain: dull  achy  ** no increased pain following treatment.  Patient Goals: Sleep and mop without pain   OBJECTIVE  Posture/Observations:  R PSIS high, R ASIS low in standing. (from prior session) not re-assessed today.    Palpation/Segmental Motion/Joint Play:  Special tests:   Scoliosis: very slight R thoracolumbar curve (prominent R transverse processes) LLD: RLE 78 cm, LLE: 79cm  (from prior assessment)  Range of Motion/Flexibilty:   Abdominal:  Palpation: Diastasis: 1 finger below umbilicus, narrows with chin-tuck, not leg-lift  Pelvic Floor External Exam: (initial on 08/23/20) Introitus Appears: elevated Skin integrity: WNL Palpation: TTP to R STP Cough: parodoxical Prolapse visible?: no Scar mobility: N/A  Internal Vaginal Exam: Strength (PERF): Pt. Unable to coordinate squeeze, demonstrates bearing down. Able to get ~ 2/5 when cued for TA recruitment only. Symmetry: R>L and anterior>posterior for TTP Palpation: TTP through all regions  Prolapse: none  Today: TTP to B STP externally and L anterior PR/PC, OI, and coccygeus   Gait Analysis: WNL, not fully assessed  INTERVENTIONS THIS SESSION: Manual: Assessed PFM and performed TP release to B STP externally and L anterior PR/PC, OI, and coccygeus to decrease spasm and pain and allow for improved balance of musculature for improved function and decreased symptoms. Educated on how to perform self external TP release to B IC and Bulbo to decrease spasm and improve ability of superficial muscles  to tighen and lengthen to improve lubrication and increase likleihood of ability to orgasm.   Theract: re-assessed leg length and pelvic alignment and educated on and given a heel-lift for the R shoe to improve pelvic alignment and allow for decreased pressure on lumbosacral nerve roots to prevent pain and spasm.  Therex: Reviewed mini-marches briefly to ensure proper performance and to improve strength of muscles opposing tight  musculature to allow reciprocal inhibition to improve balance of musculature surrounding the pelvis and improve overall posture for optimal musculature length-tension relationship and function.    Total time: 60 min.                             PT Short Term Goals - 08/09/20 1652      PT SHORT TERM GOAL #1   Title Patient will demonstrate a coordinated contraction, relaxation, and bulge of the pelvic floor muscles to demonstrate functional recruitment and motion and allow for decreased urinary hesitancy.    Baseline Pt. has small weak stream and takes a long time to empty bladder.    Time 5    Period Weeks    Status Achieved    Target Date 08/31/20      PT SHORT TERM GOAL #2   Title Patient will demonstrate improved pelvic alignment and balance of musculature surrounding the pelvis to facilitate decreased PFM spasms and decrease pelvic pain.    Baseline R anterior rotation and hyperlordosis/kyphisis as well as mild R thoracolumbar scoliosis.    Time 5    Period Weeks    Status New    Target Date 08/31/20             PT Long Term Goals - 07/27/20 1322      PT LONG TERM GOAL #1   Title Patient will report ability to consistently empty the bladder in less than 1 minute over the course of the prior two weeks to demonstrate improved functional ability.    Baseline Urine stream stops and starts and is small/weak.    Time 10    Period Weeks    Status New    Target Date 10/05/20      PT LONG TERM GOAL #2   Title Patient will describe pain no greater than 1/10 during moderate-to-high-level  exercise for 45 min. to demonstrate improved functional ability.    Baseline Having pain when mopping, bending, etc.    Time 10    Period Weeks    Status New    Target Date 10/05/20                 Plan - 08/30/20 1547    Clinical Impression Statement Pt. Responded well to all interventions today, demonstrating decreased spasm and pain/TTP as well as  understanding and correct performance of all education and exercises provided today. They will continue to benefit from skilled physical therapy to work toward remaining goals and maximize function as well as decrease likelihood of symptom increase or recurrence.    Personal Factors and Comorbidities Comorbidity 1    Comorbidities 2 c-sections    Examination-Activity Limitations Toileting;Bend;Lift;Squat    Examination-Participation Restrictions Cleaning;Other;Yard Work   exercise   Stability/Clinical Decision Making Stable/Uncomplicated    Rehab Potential Excellent    PT Frequency 1x / week    PT Duration Other (comment)   10 weeks   PT Treatment/Interventions ADLs/Self Care Home Management;Biofeedback;Electrical Stimulation;Moist Heat;Gait training;Stair training;Functional mobility training;Therapeutic activities;Neuromuscular re-education;Therapeutic exercise;Patient/family  education;Scar mobilization;Manual techniques;Passive range of motion;Dry needling;Taping;Spinal Manipulations;Joint Manipulations    PT Next Visit Plan re-assess internally, teach self release internally if Pt. brings tool. incorporate glute strengthening into squats/lunges/stairs, progress deep-core PRN.    PT Home Exercise Plan low back stretch, posterior pelvic tilt with TA/obliques, hip-flexor stretch, prone dorothy's, hip EXT in quad, monster walks and squats with blue band, lumbar support, self external release, tools.    Consulted and Agree with Plan of Care Patient           Patient will benefit from skilled therapeutic intervention in order to improve the following deficits and impairments:  Increased muscle spasms, Improper body mechanics, Impaired tone, Decreased scar mobility, Decreased coordination, Decreased strength, Pain, Postural dysfunction  Visit Diagnosis: Abnormal posture  Sacrococcygeal disorders, not elsewhere classified  Other muscle spasm     Problem List Patient Active Problem List    Diagnosis Date Noted  . Advice given about COVID-19 virus infection 08/16/2020  . SI (sacroiliac) joint inflammation (HCC) 07/15/2020  . Upper respiratory infection 04/02/2020  . Allergic rhinitis 01/09/2020  . Weak urinary stream 01/05/2019  . Attention deficit disorder (ADD) 10/25/2015  . Family history of hemochromatosis 05/25/2013  . History of renal calculi    Cleophus Molt DPT, ATC Cleophus Molt 08/30/2020, 3:51 PM   [facility] MAIN [facility] SERVICES [address] [place], [place], [address] Phone: [phone]   Fax:  [phone]  Name: Brandi Johnson MRN: [id number] Date of Birth: Apr 26, 1986

## 2020-09-06 ENCOUNTER — Other Ambulatory Visit: Payer: Self-pay

## 2020-09-06 ENCOUNTER — Ambulatory Visit: Payer: No Typology Code available for payment source | Attending: Internal Medicine

## 2020-09-06 DIAGNOSIS — M62838 Other muscle spasm: Secondary | ICD-10-CM | POA: Diagnosis present

## 2020-09-06 DIAGNOSIS — M533 Sacrococcygeal disorders, not elsewhere classified: Secondary | ICD-10-CM | POA: Diagnosis present

## 2020-09-06 DIAGNOSIS — R293 Abnormal posture: Secondary | ICD-10-CM | POA: Insufficient documentation

## 2020-09-06 NOTE — Therapy (Signed)
[facility] MAIN [facility] SERVICES [address] [place], [place], [address] Phone: [phone]   Fax:  [phone]  Physical Therapy Treatment  The patient has been informed of current processes in place at Outpatient Rehab to protect patients from Covid-19 exposure including social distancing, schedule modifications, and new cleaning procedures. After discussing their particular risk with a therapist based on the patient's personal risk factors, the patient has decided to proceed with in-person therapy.  Patient Details  Name: Brandi Johnson MRN: [id number] Date of Birth: January 16, 1986 No data recorded  Encounter Date: 09/06/2020   PT End of Session - 09/08/20 0758    Visit Number 5    Number of Visits 10    Date for PT Re-Evaluation 10/05/20    Authorization Type UMR    Authorization Time Period 07/26/20 through 10/05/20    Authorization - Visit Number 5    Authorization - Number of Visits 10    Progress Note Due on Visit 10    PT Start Time 1630    PT Stop Time 1730    PT Time Calculation (min) 60 min    Activity Tolerance Patient tolerated treatment well;No increased pain    Behavior During Therapy WFL for tasks assessed/performed           Past Medical History:  Diagnosis Date  . AR (allergic rhinitis)   . Hemorrhoids in pregnancy   . History of renal calculi    with hydroneprosis noted during pregnancy  . Hydronephrosis   . Motion sickness   . UTI (lower urinary tract infection)     Past Surgical History:  Procedure Laterality Date  . CESAREAN SECTION  2012    There were no vitals filed for this visit.  Pelvic Floor Physical Therapy Treatment  SCREENING  Changes in Medical history, allergies or medications: no  SUBJECTIVE  Patient reports: Every "once in a blue moon" she feels discomfort in the L SIJ  but less than it was before the addition of the lift. She has been wearing the heel-lift pretty consistently and it  seems to be helping.  Precautions:  none  Location of pain: L SIJ Current pain:  0/10  Max pain:  1-2/10 Least pain:  0/10 Nature of pain: dull achy  ** no increased pain following treatment.  Patient Goals: Sleep and mop without pain   OBJECTIVE  Posture/Observations:  Pelvis level with heel-lift in place   Palpation/Segmental Motion/Joint Play:  Special tests:   Scoliosis: very slight R thoracolumbar curve (prominent R transverse processes) LLD: RLE 78 cm, LLE: 79cm  (from prior assessment)  Range of Motion/Flexibilty:   Strength/MMT: B hip EXT ~ 4+/5  Abdominal:  Palpation: Diastasis: 1 finger below umbilicus, narrows with chin-tuck, not leg-lift  Pelvic Floor External Exam: (initial on 08/23/20) Introitus Appears: elevated Skin integrity: WNL Palpation: TTP to R STP Cough: parodoxical Prolapse visible?: no Scar mobility: N/A  Internal Vaginal Exam: Strength (PERF): Pt. Unable to coordinate squeeze, demonstrates bearing down. Able to get ~ 2/5 when cued for TA recruitment only. Symmetry: R>L and anterior>posterior for TTP Palpation: TTP through all regions  Prolapse: none  Today: TTP to all PFM internally with the least at the L anterior PR/PC, TTP to B IC and STP (less than previously)  Gait Analysis: WNL, not fully assessed   INTERVENTIONS THIS SESSION: Manual: re-assessed PFM tenderness and educated Pt on how to release external PFM with her fingers, posterior fourchette with her thumb, and internal  PFM with her tool to decrease spasm and pain and allow for improved balance of musculature for improved function and decreased symptoms.  Therex: Reviewed mini-marches briefly and instructed Pt. To replace a self-led exercise using a weighted plate with reverse crunches and not to extend past ~ 45 deg. With her legs and incorporated glutes into squats and lunges more with emphasis on hip-hinge to ensure proper performance and to improve strength of muscles  opposing tight musculature to allow reciprocal inhibition to improve balance of musculature surrounding the pelvis and improve overall posture for optimal musculature length-tension relationship and function.    Total time: 60 min.                               PT Short Term Goals - 08/09/20 1652      PT SHORT TERM GOAL #1   Title Patient will demonstrate a coordinated contraction, relaxation, and bulge of the pelvic floor muscles to demonstrate functional recruitment and motion and allow for decreased urinary hesitancy.    Baseline Pt. has small weak stream and takes a long time to empty bladder.    Time 5    Period Weeks    Status Achieved    Target Date 08/31/20      PT SHORT TERM GOAL #2   Title Patient will demonstrate improved pelvic alignment and balance of musculature surrounding the pelvis to facilitate decreased PFM spasms and decrease pelvic pain.    Baseline R anterior rotation and hyperlordosis/kyphisis as well as mild R thoracolumbar scoliosis.    Time 5    Period Weeks    Status New    Target Date 08/31/20             PT Long Term Goals - 07/27/20 1322      PT LONG TERM GOAL #1   Title Patient will report ability to consistently empty the bladder in less than 1 minute over the course of the prior two weeks to demonstrate improved functional ability.    Baseline Urine stream stops and starts and is small/weak.    Time 10    Period Weeks    Status New    Target Date 10/05/20      PT LONG TERM GOAL #2   Title Patient will describe pain no greater than 1/10 during moderate-to-high-level  exercise for 45 min. to demonstrate improved functional ability.    Baseline Having pain when mopping, bending, etc.    Time 10    Period Weeks    Status New    Target Date 10/05/20                 Plan - 09/08/20 0758    Clinical Impression Statement Pt. Responded well to all interventions today, demonstrating improved deep-core strength  and glute strength (R equal to L) as well as understanding and correct performance of all education and exercises provided today. They will continue to benefit from skilled physical therapy to work toward remaining goals and maximize function as well as decrease likelihood of symptom increase or recurrence.    Personal Factors and Comorbidities Comorbidity 1    Comorbidities 2 c-sections    Examination-Activity Limitations Toileting;Bend;Lift;Squat    Examination-Participation Restrictions Cleaning;Other;Yard Work   exercise   Stability/Clinical Decision Making Stable/Uncomplicated    Rehab Potential Excellent    PT Frequency 1x / week    PT Duration Other (comment)   10 weeks  PT Treatment/Interventions ADLs/Self Care Home Management;Biofeedback;Electrical Stimulation;Moist Heat;Gait training;Stair training;Functional mobility training;Therapeutic activities;Neuromuscular re-education;Therapeutic exercise;Patient/family education;Scar mobilization;Manual techniques;Passive range of motion;Dry needling;Taping;Spinal Manipulations;Joint Manipulations    PT Next Visit Plan re-assess internally, follow up on success with tool, re-assess glute strength. do FMS and assess spinal mobility if no lasting improvement.    PT Home Exercise Plan low back stretch, posterior pelvic tilt with TA/obliques, hip-flexor stretch, prone dorothy's, hip EXT in quad, monster walks and squats with blue band, lumbar support, self external release, tools, squats, D/C unilateral glute strengthening.    Consulted and Agree with Plan of Care Patient           Patient will benefit from skilled therapeutic intervention in order to improve the following deficits and impairments:  Increased muscle spasms, Improper body mechanics, Impaired tone, Decreased scar mobility, Decreased coordination, Decreased strength, Pain, Postural dysfunction  Visit Diagnosis: Abnormal posture  Sacrococcygeal disorders, not elsewhere  classified  Other muscle spasm     Problem List Patient Active Problem List   Diagnosis Date Noted  . Advice given about COVID-19 virus infection 08/16/2020  . SI (sacroiliac) joint inflammation (HCC) 07/15/2020  . Upper respiratory infection 04/02/2020  . Allergic rhinitis 01/09/2020  . Weak urinary stream 01/05/2019  . Attention deficit disorder (ADD) 10/25/2015  . Family history of hemochromatosis 05/25/2013  . History of renal calculi    Cleophus Molt DPT, ATC Cleophus Molt 09/08/2020, 8:07 AM  [place] [facility] MAIN [facility] SERVICES [address] [place], [place], [address] Phone: [phone]   Fax:  [phone]  Name: Brandi Johnson MRN: [id number] Date of Birth: [birth year]

## 2020-09-06 NOTE — Patient Instructions (Signed)
°  Keep your trunk as one unit and let it hinge forward from the hips as you push your bottom back and bend your knees at the same rate that you bend your hips. Keep your weight back toward your heels but do not actually lift the toes off the ground. Exhale starting just before and all the way through standing to help engage the glutes and lower tummy muscles.   Self Internal Trigger Point Relief    1) Wash your hands and prop yourself up in a way where you can easily reach the vagina. You may wish to have a small hand-held mirror near by.  2) lubricate the tool and vaginal opening using a hypoallergenic lubricant such as "slippery-stuff".   3) Slowly and gently insert the tool into the vagina using deep breaths to allow relaxation of the muscles around the tool.  4) Avoiding the "12 o-clock" region near the urethra, gently use the handle of the tool like a lever to press the angled tip of the tool onto the wall of the pelvic floor.   5) Move the tool to different areas of the pelvic floor and feel for areas that are tender called "trigger points". When you find one hold the tool still, applying just enough pressure to elicit mild discomfort and take deep belly breaths until the discomfort subsides or decreases by at least 50%.   6) Repeat the process for any trigger points you find spending between 3-10 minutes on this per night until you do not find any more trigger points or you are told otherwise by your therapist..

## 2020-10-04 ENCOUNTER — Other Ambulatory Visit: Payer: Self-pay

## 2020-10-04 ENCOUNTER — Ambulatory Visit: Payer: No Typology Code available for payment source | Attending: Internal Medicine

## 2020-10-04 DIAGNOSIS — M533 Sacrococcygeal disorders, not elsewhere classified: Secondary | ICD-10-CM | POA: Diagnosis present

## 2020-10-04 DIAGNOSIS — R293 Abnormal posture: Secondary | ICD-10-CM | POA: Insufficient documentation

## 2020-10-04 DIAGNOSIS — M62838 Other muscle spasm: Secondary | ICD-10-CM | POA: Diagnosis present

## 2020-10-04 NOTE — Therapy (Addendum)
[place] MAIN [facility] SERVICES [address] [place], [place], [address] Phone: [phone]   Fax:  [phone]  Physical Therapy Treatment  The patient has been informed of current processes in place at Outpatient Rehab to protect patients from Covid-19 exposure including social distancing, schedule modifications, and new cleaning procedures. After discussing their particular risk with a therapist based on the patient's personal risk factors, the patient has decided to proceed with in-person therapy.  Patient Details  Name: Brandi Johnson MRN: [id number] Date of Birth: 07/13/1986 No data recorded  Encounter Date: 10/04/2020   PT End of Session - 10/10/20 1258    Visit Number 6    Number of Visits 10    Date for PT Re-Evaluation 10/05/20    Authorization Type UMR    Authorization Time Period 07/26/20 through 10/05/20    Authorization - Visit Number 6    Authorization - Number of Visits 10    Progress Note Due on Visit 10    PT Start Time 1630    PT Stop Time 1730    PT Time Calculation (min) 60 min    Activity Tolerance Patient tolerated treatment well;No increased pain    Behavior During Therapy WFL for tasks assessed/performed           Past Medical History:  Diagnosis Date  . AR (allergic rhinitis)   . Hemorrhoids in pregnancy   . History of renal calculi    with hydroneprosis noted during pregnancy  . Hydronephrosis   . Motion sickness   . UTI (lower urinary tract infection)     Past Surgical History:  Procedure Laterality Date  . CESAREAN SECTION  2012    There were no vitals filed for this visit.  Pelvic Floor Physical Therapy Treatment  SCREENING  Changes in Medical history, allergies or medications: no  SUBJECTIVE  Patient reports: Things are going well. Has not had the L SIJ pain at all. No pain at night.  Precautions:  none  Location of pain: L SIJ Current pain:  0/10  Max pain:  0/10 Least pain:   0/10 Nature of pain: dull achy  ** no increased pain following treatment.  Patient Goals: Sleep and mop without pain   OBJECTIVE  Posture/Observations:  Pelvis level with heel-lift in place   Palpation/Segmental Motion/Joint Play:  Special tests:   Scoliosis: very slight R thoracolumbar curve (prominent R transverse processes) LLD: RLE 78 cm, LLE: 79cm  (from prior assessment)  Range of Motion/Flexibilty:   Strength/MMT: B hip EXT ~ 4+/5  Abdominal:  Palpation: Diastasis: 1 finger below umbilicus, narrows with chin-tuck, not leg-lift  Pelvic Floor External Exam: (initial on 08/23/20) Introitus Appears: elevated Skin integrity: WNL Palpation: TTP to R STP Cough: parodoxical Prolapse visible?: no Scar mobility: N/A  Internal Vaginal Exam: Strength (PERF): Pt. Unable to coordinate squeeze, demonstrates bearing down. Able to get ~ 2/5 when cued for TA recruitment only. Symmetry: R>L and anterior>posterior for TTP Palpation: TTP through all regions  Prolapse: none  Today: TTP to all PFM internally with the least at the L anterior PR/PC, TTP to B IC and STP (less than previously)  Gait Analysis: WNL, not fully assessed   INTERVENTIONS THIS SESSION: Manual: Re-assessed PFM tenderness and released R STP, B posterior PR/PC and L anterior PR/PC and reviewed coordination and how to use anti-kegel for further relaxation.  Theract: Re-assessed goals and decided to D/C to HEP. Educated on how to maintain improvement with HEP. as  well as educated on appropriate clitoral stimulation video and products to aid in achievement of orgasm.    Total time: 60 min.                              PT Short Term Goals - 10/04/20 1644      PT SHORT TERM GOAL #1   Title Patient will demonstrate a coordinated contraction, relaxation, and bulge of the pelvic floor muscles to demonstrate functional recruitment and motion and allow for decreased urinary hesitancy.     Baseline Pt. has small weak stream and takes a long time to empty bladder.    Time 5    Period Weeks    Status Achieved    Target Date 08/31/20      PT SHORT TERM GOAL #2   Title Patient will demonstrate improved pelvic alignment and balance of musculature surrounding the pelvis to facilitate decreased PFM spasms and decrease pelvic pain.    Baseline R anterior rotation and hyperlordosis/kyphisis as well as mild R thoracolumbar scoliosis.    Time 5    Period Weeks    Status Achieved    Target Date 08/31/20             PT Long Term Goals - 10/04/20 1644      PT LONG TERM GOAL #1   Title Patient will report ability to consistently empty the bladder in less than 1 minute over the course of the prior two weeks to demonstrate improved functional ability.    Baseline Urine stream stops and starts and is small/weak.    Time 10    Period Weeks    Status Achieved    Target Date 10/05/20      PT LONG TERM GOAL #2   Title Patient will describe pain no greater than 1/10 during moderate-to-high-level  exercise for 45 min. to demonstrate improved functional ability.    Baseline Having pain when mopping, bending, etc.    Time 10    Period Weeks    Status Achieved    Target Date 10/05/20                 Plan - 10/10/20 1302    Clinical Impression Statement Pt. has met all goals and has been pain-free for over two weeks. She demonstrated understanding of all education and HEP exercises and wished to D/C PT at this time due to Sx. improvement. We will D/C to HEP at this time.    Personal Factors and Comorbidities Comorbidity 1    Comorbidities 2 c-sections    Examination-Activity Limitations Toileting;Bend;Lift;Squat    Examination-Participation Restrictions Cleaning;Other;Yard Work   exercise   Stability/Clinical Decision Making Stable/Uncomplicated    Rehab Potential Excellent    PT Frequency 1x / week    PT Duration Other (comment)   10 weeks   PT Treatment/Interventions  ADLs/Self Care Home Management;Biofeedback;Electrical Stimulation;Moist Heat;Gait training;Stair training;Functional mobility training;Therapeutic activities;Neuromuscular re-education;Therapeutic exercise;Patient/family education;Scar mobilization;Manual techniques;Passive range of motion;Dry needling;Taping;Spinal Manipulations;Joint Manipulations    PT Next Visit Plan D/C to HEP    PT Home Exercise Plan low back stretch, posterior pelvic tilt with TA/obliques, hip-flexor stretch, prone dorothy's, hip EXT in quad, monster walks and squats with blue band, lumbar support, self external release, tools, squats, D/C unilateral glute strengthening.    Consulted and Agree with Plan of Care Patient           Patient will benefit from skilled therapeutic  intervention in order to improve the following deficits and impairments:  Increased muscle spasms, Improper body mechanics, Impaired tone, Decreased scar mobility, Decreased coordination, Decreased strength, Pain, Postural dysfunction  Visit Diagnosis: Abnormal posture  Sacrococcygeal disorders, not elsewhere classified  Other muscle spasm     Problem List Patient Active Problem List   Diagnosis Date Noted  . Advice given about COVID-19 virus infection 08/16/2020  . SI (sacroiliac) joint inflammation ([place]) 07/15/2020  . Upper respiratory infection 04/02/2020  . Allergic rhinitis 01/09/2020  . Weak urinary stream 01/05/2019  . Attention deficit disorder (ADD) 10/25/2015  . Family history of hemochromatosis 05/25/2013  . History of renal calculi    Willa Rough DPT, ATC Willa Rough 10/10/2020, 1:07 PM  [place] MAIN [facility] SERVICES [address] [place], [place], [address] Phone: [phone]   Fax:  [phone]  Name: FALESHA SCHOMMER MRN: [id number] Date of Birth: Sep 20, 1986

## 2020-10-17 ENCOUNTER — Other Ambulatory Visit: Payer: Self-pay

## 2020-10-17 ENCOUNTER — Encounter: Payer: Self-pay | Admitting: Internal Medicine

## 2020-10-17 ENCOUNTER — Telehealth (INDEPENDENT_AMBULATORY_CARE_PROVIDER_SITE_OTHER): Payer: No Typology Code available for payment source | Admitting: Internal Medicine

## 2020-10-17 DIAGNOSIS — J208 Acute bronchitis due to other specified organisms: Secondary | ICD-10-CM | POA: Diagnosis not present

## 2020-10-17 DIAGNOSIS — U071 COVID-19: Secondary | ICD-10-CM | POA: Diagnosis not present

## 2020-10-17 HISTORY — DX: Acute bronchitis due to other specified organisms: J20.8

## 2020-10-17 NOTE — Progress Notes (Signed)
Virtual Visit via Caregility  This visit type was conducted due to national recommendations for restrictions regarding the COVID-19 pandemic (e.g. social distancing).  This format is felt to be most appropriate for this patient at this time.  All issues noted in this document were discussed and addressed.  No physical exam was performed (except for noted visual exam findings with Video Visits).   I connected with@ on 10/17/20 at 11:30 AM EDT by a video enabled telemedicine application  and verified that I am speaking with the correct person using two identifiers. Location patient: home Location provider: work or home office Persons participating in the virtual visit: patient, provider  I discussed the limitations, risks, security and privacy concerns of performing an evaluation and management service by telephone and the availability of in person appointments. I also discussed with the patient that there may be a patient responsible charge related to this service. The patient expressed understanding and agreed to proceed.  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  COVID Positive test oct 12.  Husband and two children also infected and recovering. Patient reports a productive cough, anosmia,  And mild  Chest pressure in upper lungs and tightness  between shoulder blades.  Skin felt sensitive,  But no fevers . Congestion also noted,  All symptoms improving,  sats 98%  No pleurisy .  Not vaccinated due to religious exemption    ROS: See pertinent positives and negatives per HPI.  Past Medical History:  Diagnosis Date  . AR (allergic rhinitis)   . Hemorrhoids in pregnancy   . History of renal calculi    with hydroneprosis noted during pregnancy  . Hydronephrosis   . Motion sickness   . UTI (lower urinary tract infection)     Past Surgical History:  Procedure Laterality Date  . CESAREAN SECTION  2012     Family History  Problem Relation Age of Onset  . Mental illness Mother   . Colitis Mother        collagenous  . Colon polyps Mother   . Heart disease Father 52       CAD s/p CABG  . Diabetes Father   . Skin cancer Father   . Diabetes Maternal Grandmother   . Hyperlipidemia Maternal Grandmother   . Stroke Maternal Grandmother   . Stroke Paternal Grandmother   . Celiac disease Sister   . Cancer Neg Hx     SOCIAL HX:  reports that she has never smoked. She has never used smokeless tobacco. She reports that she does not drink alcohol and does not use drugs.   Current Outpatient Medications:  .  azelastine (ASTELIN) 0.1 % nasal spray, Place into both nostrils., Disp: , Rfl:  .  Biotin 10 MG CAPS, Take 10 mg by mouth daily. , Disp: , Rfl:  .  cetirizine (ZYRTEC) 10 MG tablet, Take 1 tablet (10 mg total) daily by mouth., Disp: 90 tablet, Rfl: 4 .  Cholecalciferol (VITAMIN D3) 1.25 MG (50000 UT) TABS, Take by mouth., Disp: , Rfl:  .  levonorgestrel (MIRENA) 20 MCG/24HR IUD, 1 each by Intrauterine route once., Disp: , Rfl:  .  meclizine (ANTIVERT) 25 MG tablet, Take 25 mg by mouth daily as needed., Disp: , Rfl:  .  Omega-3 Fatty Acids (FISH OIL) 1000 MG CAPS, Take by mouth., Disp: , Rfl:  .  omeprazole (PRILOSEC) 20 MG capsule, Take 1  capsule (20 mg total) by mouth daily., Disp: 30 capsule, Rfl: 3 .  tiZANidine (ZANAFLEX) 4 MG capsule, Take 1 capsule (4 mg total) by mouth 3 (three) times daily as needed for muscle spasms., Disp: 60 capsule, Rfl: 1  EXAM:  VITALS per patient if applicable:  GENERAL: alert, oriented, appears well and in no acute distress  HEENT: atraumatic, conjunttiva clear, no obvious abnormalities on inspection of external nose and ears  NECK: normal movements of the head and neck  LUNGS: on inspection no signs of respiratory distress, breathing rate appears normal, no obvious gross SOB, gasping or wheezing  CV: no obvious cyanosis  MS: moves all visible  extremities without noticeable abnormality  PSYCH/NEURO: pleasant and cooperative, no obvious depression or anxiety, speech and thought processing grossly intact  ASSESSMENT AND PLAN:  Discussed the following assessment and plan:  Acute bronchitis due to COVID-19 virus  Acute bronchitis due to COVID-19 virus Symptoms are all improving and she denies desaturations,  Dyspnea and wheezing.  No indication for MoAb infusion. Will order CXR if symptoms persistent > 3 weeks     I discussed the assessment and treatment plan with the patient. The patient was provided an opportunity to ask questions and all were answered. The patient agreed with the plan and demonstrated an understanding of the instructions.   The patient was advised to call back or seek an in-person evaluation if the symptoms worsen or if the condition fails to improve as anticipated.  I provided 18 minutes of face-to-face time during this encounter.   Sherlene Shams, MD

## 2020-10-17 NOTE — Telephone Encounter (Signed)
LMTCB. Need to schedule pt for a virtual visit to discuss symptoms.

## 2020-10-17 NOTE — Assessment & Plan Note (Signed)
Symptoms are all improving and she denies desaturations,  Dyspnea and wheezing.  No indication for MoAb infusion. Will order CXR if symptoms persistent > 3 weeks

## 2020-10-31 ENCOUNTER — Other Ambulatory Visit: Payer: Self-pay | Admitting: Internal Medicine

## 2020-10-31 ENCOUNTER — Telehealth (INDEPENDENT_AMBULATORY_CARE_PROVIDER_SITE_OTHER): Payer: No Typology Code available for payment source | Admitting: Internal Medicine

## 2020-10-31 ENCOUNTER — Encounter: Payer: Self-pay | Admitting: Internal Medicine

## 2020-10-31 ENCOUNTER — Other Ambulatory Visit: Payer: Self-pay

## 2020-10-31 DIAGNOSIS — J0100 Acute maxillary sinusitis, unspecified: Secondary | ICD-10-CM | POA: Diagnosis not present

## 2020-10-31 MED ORDER — METHYLPREDNISOLONE 4 MG PO TBPK
ORAL_TABLET | ORAL | 0 refills | Status: DC
Start: 2020-10-31 — End: 2020-11-29

## 2020-10-31 MED ORDER — AMOXICILLIN-POT CLAVULANATE 875-125 MG PO TABS: 1.0000 | ORAL_TABLET | Freq: Two times a day (BID) | ORAL | 0 refills | Status: DC

## 2020-10-31 NOTE — Progress Notes (Signed)
Patient tested positive in October. Is feeling better but has been increasingly fatigued since last Monday. Also having post nasal drip, congestion, and drainage down the throat. Coughing and blowing out clear mucous.

## 2020-10-31 NOTE — Assessment & Plan Note (Signed)
Post COVID ,  WITH SYMPTOMS OF CONGESTION  PND AND facial pain since oct 24.Marland Kitchen  augmentin  and methylprednisolone prescribed.  ,  Robitussin otc for cough

## 2020-10-31 NOTE — Progress Notes (Signed)
Virtual Visit via caregility  This visit type was conducted due to national recommendations for restrictions regarding the COVID-19 pandemic (e.g. social distancing).  This format is felt to be most appropriate for this patient at this time.  All issues noted in this document were discussed and addressed.  No physical exam was performed (except for noted visual exam findings with Video Visits).   I connected with@ on 10/31/20 at  2:30 PM EDT by a video enabled telemedicine application  and verified that I am speaking with the correct person using two identifiers. Location patient: home Location provider: work or home office Persons participating in the virtual visit: patient, provider  I discussed the limitations, risks, security and privacy concerns of performing an evaluation and management service by telephone and the availability of in person appointments. I also discussed with the patient that there may be a patient responsible charge related to this service. The patient expressed understanding and agreed to proceed.   Reason for visit: sinusitis, fatigue  HPI:  34 yr old female diagnosed with COVID 19 infection on oct 12,  Treated with ivermectin  , resolved without hypoxemia .  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  Started having sinus congestion and facial pressure involving  both maxillary sinuses and ethmoids,  On the 24th.  Has been treating with saline irrigations,  Decongestants and antihistamines ,  Both  oral and topical with no change .  Fatigue worse as well         ROS: See pertinent positives and negatives per HPI.  Past Medical History:  Diagnosis Date  . AR (allergic rhinitis)   . Hemorrhoids in pregnancy   . History of renal calculi    with hydroneprosis noted during pregnancy  . Hydronephrosis   . Motion sickness   . UTI (lower urinary tract infection)     Past Surgical History:  Procedure Laterality Date  . CESAREAN SECTION  2012    Family History   Problem Relation Age of Onset  . Mental illness Mother   . Colitis Mother        collagenous  . Colon polyps Mother   . Heart disease Father 40       CAD s/p CABG  . Diabetes Father   . Skin cancer Father   . Diabetes Maternal Grandmother   . Hyperlipidemia Maternal Grandmother   . Stroke Maternal Grandmother   . Stroke Paternal Grandmother   . Celiac disease Sister   . Cancer Neg Hx     SOCIAL HX:  reports that she has never smoked. She has never used smokeless tobacco. She reports that she does not drink alcohol and does not use drugs.   Current Outpatient Medications:  .  azelastine (ASTELIN) 0.1 % nasal spray, Place into both nostrils., Disp: , Rfl:  .  Biotin 10 MG CAPS, Take 10 mg by mouth daily. , Disp: , Rfl:  .  cetirizine (ZYRTEC) 10 MG tablet, Take 1 tablet (10 mg total) daily by mouth., Disp: 90 tablet, Rfl: 4 .  Cholecalciferol (VITAMIN D3) 1.25 MG (50000 UT) TABS, Take by mouth., Disp: , Rfl:  .  Cyanocobalamin (VITAMIN B-12 PO), Take by mouth daily in the afternoon., Disp: , Rfl:  .  levonorgestrel (MIRENA) 20 MCG/24HR IUD, 1 each by Intrauterine route once., Disp: , Rfl:  .  Omega-3 Fatty Acids (FISH OIL) 1000 MG CAPS, Take by mouth., Disp: , Rfl:  .  tiZANidine (ZANAFLEX) 4 MG capsule, Take 1  capsule (4 mg total) by mouth 3 (three) times daily as needed for muscle spasms., Disp: 60 capsule, Rfl: 1 .  Zinc 50 MG TABS, Take by mouth daily in the afternoon., Disp: , Rfl:  .  amoxicillin-clavulanate (AUGMENTIN) 875-125 MG tablet, Take 1 tablet by mouth 2 (two) times daily., Disp: 14 tablet, Rfl: 0 .  meclizine (ANTIVERT) 25 MG tablet, Take 25 mg by mouth daily as needed. (Patient not taking: Reported on 10/31/2020), Disp: , Rfl:  .  methylPREDNISolone (MEDROL DOSEPAK) 4 MG TBPK tablet, Use as directed, Disp: 21 tablet, Rfl: 0  EXAM:  VITALS per patient if applicable:  GENERAL: alert, oriented, appears well and in no acute distress  HEENT: atraumatic, conjunttiva  clear, no obvious abnormalities on inspection of external nose and ears  NECK: normal movements of the head and neck  LUNGS: on inspection no signs of respiratory distress, breathing rate appears normal, no obvious gross SOB, gasping or wheezing  CV: no obvious cyanosis  MS: moves all visible extremities without noticeable abnormality  PSYCH/NEURO: pleasant and cooperative, no obvious depression or anxiety, speech and thought processing grossly intact  ASSESSMENT AND PLAN:  Discussed the following assessment and plan:  Acute non-recurrent maxillary sinusitis  Sinusitis, acute maxillary Post COVID ,  WITH SYMPTOMS OF CONGESTION  PND AND facial pain since oct 24.Marland Kitchen  augmentin  and methylprednisolone prescribed.  ,  Robitussin otc for cough     I discussed the assessment and treatment plan with the patient. The patient was provided an opportunity to ask questions and all were answered. The patient agreed with the plan and demonstrated an understanding of the instructions.   The patient was advised to call back or seek an in-person evaluation if the symptoms worsen or if the condition fails to improve as anticipated.  I provided  20 minutes of non-face-to-face time during this encounter.   Sherlene Shams, MD

## 2020-11-28 ENCOUNTER — Encounter: Payer: No Typology Code available for payment source | Admitting: Certified Nurse Midwife

## 2020-11-29 ENCOUNTER — Other Ambulatory Visit: Payer: Self-pay

## 2020-11-29 ENCOUNTER — Ambulatory Visit (INDEPENDENT_AMBULATORY_CARE_PROVIDER_SITE_OTHER): Payer: No Typology Code available for payment source | Admitting: Certified Nurse Midwife

## 2020-11-29 ENCOUNTER — Encounter: Payer: Self-pay | Admitting: Certified Nurse Midwife

## 2020-11-29 VITALS — BP 116/69 | HR 68 | Ht 62.0 in | Wt 126.2 lb

## 2020-11-29 DIAGNOSIS — Z01419 Encounter for gynecological examination (general) (routine) without abnormal findings: Secondary | ICD-10-CM | POA: Diagnosis not present

## 2020-11-29 DIAGNOSIS — E559 Vitamin D deficiency, unspecified: Secondary | ICD-10-CM | POA: Diagnosis not present

## 2020-11-29 DIAGNOSIS — Z8616 Personal history of COVID-19: Secondary | ICD-10-CM

## 2020-11-29 DIAGNOSIS — Z1159 Encounter for screening for other viral diseases: Secondary | ICD-10-CM | POA: Diagnosis not present

## 2020-11-29 DIAGNOSIS — Z13220 Encounter for screening for lipoid disorders: Secondary | ICD-10-CM

## 2020-11-29 NOTE — Progress Notes (Signed)
GYNECOLOGY ANNUAL PREVENTATIVE CARE ENCOUNTER NOTE  History:     Brandi Johnson is a 34 y.o. [id number] female here for a routine annual gynecologic exam.  Current complaints: having diffculty with voiding, has been follow up with PT , pt state she injured her tail bone several yrs ago which she thinks has led to muscle spams. Has improved with PT. State she has had some recent spasms that has caused some pain with intercourse..   Denies abnormal vaginal bleeding, discharge, pelvic pain, problems with intercourse or other gynecologic concerns.     Social Relationship:married  Living: with spouse and family  Work: [employer] Exercise: 3-4  X wk for 30 min.  Smoke/Alcohol/drug use: denies use   Gynecologic History No LMP recorded (lmp unknown). (Menstrual status: IUD). Contraception: IUD Last Pap: 11/18/2018. Results were: normal with negative HPV Last mammogram: n/a .   Upstream - 11/29/20 0901      Pregnancy Intention Screening   Does the patient want to become pregnant in the next year? No    Does the patient's partner want to become pregnant in the next year? No    Would the patient like to discuss contraceptive options today? No      Contraception Wrap Up   Current Method IUD or IUS    End Method IUD or IUS    Contraception Counseling Provided No          The pregnancy intention screening data noted above was reviewed. Potential methods of contraception were discussed. The patient elected to proceed with IUD or IUS.   Obstetric History OB History  Gravida Para Term Preterm AB Living  2 2 2     2   SAB TAB Ectopic Multiple Live Births          2    # Outcome Date GA Lbr Len/2nd Weight Sex Delivery Anes PTL Lv  2 Term 03/01/14   7 lb 6.4 oz (3.357 kg) F CS-LTranv Spinal  LIV  1 Term 06/19/11   8 lb 1.6 oz (3.674 kg) M CS-LTranv   LIV    Past Medical History:  Diagnosis Date  . AR (allergic rhinitis)   . Hemorrhoids in pregnancy   . History of  renal calculi    with hydroneprosis noted during pregnancy  . Hydronephrosis   . Motion sickness   . UTI (lower urinary tract infection)     Past Surgical History:  Procedure Laterality Date  . CESAREAN SECTION  2012    Current Outpatient Medications on File Prior to Visit  Medication Sig Dispense Refill  . azelastine (ASTELIN) 0.1 % nasal spray Place into both nostrils.    . Biotin 10 MG CAPS Take 10 mg by mouth daily.     . cetirizine (ZYRTEC) 10 MG tablet Take 1 tablet (10 mg total) daily by mouth. 90 tablet 4  . Cholecalciferol (VITAMIN D3) 1.25 MG (50000 UT) TABS Take by mouth.    . Cyanocobalamin (VITAMIN B-12 PO) Take by mouth daily in the afternoon.    2013 levonorgestrel (MIRENA) 20 MCG/24HR IUD 1 each by Intrauterine route once.    . Omega-3 Fatty Acids (FISH OIL) 1000 MG CAPS Take by mouth.    Marland Kitchen tiZANidine (ZANAFLEX) 4 MG capsule Take 1 capsule (4 mg total) by mouth 3 (three) times daily as needed for muscle spasms. 60 capsule 1  . Zinc 50 MG TABS Take by mouth daily in the afternoon.    . meclizine (ANTIVERT)  25 MG tablet Take 25 mg by mouth daily as needed. (Patient not taking: Reported on 10/31/2020)     No current facility-administered medications on file prior to visit.    Allergies  Allergen Reactions  . Sulfa Antibiotics Rash    Social History:  reports that she has never smoked. She has never used smokeless tobacco. She reports that she does not drink alcohol and does not use drugs.  Family History  Problem Relation Age of Onset  . Mental illness Mother   . Colitis Mother        collagenous  . Colon polyps Mother   . Heart disease Father 77       CAD s/p CABG  . Diabetes Father   . Skin cancer Father   . Diabetes Maternal Grandmother   . Hyperlipidemia Maternal Grandmother   . Stroke Maternal Grandmother   . Stroke Paternal Grandmother   . Celiac disease Sister   . Cancer Neg Hx     The following portions of the patient's history were reviewed and  updated as appropriate: allergies, current medications, past family history, past medical history, past social history, past surgical history and problem list.  Review of Systems Pertinent items noted in HPI and remainder of comprehensive ROS otherwise negative.  Physical Exam:  BP 116/69   Pulse 68   Ht 5\' 2"  (1.575 m)   Wt 126 lb 3 oz (57.2 kg)   LMP  (LMP Unknown)   BMI 23.08 kg/m  CONSTITUTIONAL: Well-developed, well-nourished female in no acute distress.  HENT:  Normocephalic, atraumatic, External right and left ear normal. Oropharynx is clear and moist EYES: Conjunctivae and EOM are normal. Pupils are equal, round, and reactive to light. No scleral icterus.  NECK: Normal range of motion, supple, no masses.  Normal thyroid.  SKIN: Skin is warm and dry. No rash noted. Not diaphoretic. No erythema. No pallor. MUSCULOSKELETAL: Normal range of motion. No tenderness.  No cyanosis, clubbing, or edema.  2+ distal pulses. NEUROLOGIC: Alert and oriented to person, place, and time. Normal reflexes, muscle tone coordination.  PSYCHIATRIC: Normal mood and affect. Normal behavior. Normal judgment and thought content. CARDIOVASCULAR: Normal heart rate noted, regular rhythm RESPIRATORY: Clear to auscultation bilaterally. Effort and breath sounds normal, no problems with respiration noted. BREASTS: Symmetric in size. No masses, tenderness, skin changes, nipple drainage, or lymphadenopathy bilaterally.  ABDOMEN: Soft, no distention noted.  No tenderness, rebound or guarding.  PELVIC: Normal appearing external genitalia and urethral meatus; normal appearing vaginal mucosa and cervix.  No abnormal discharge noted.  Pap smear not indicated. IUD strings present.  Normal uterine size, no other palpable masses, no uterine or adnexal tenderness.  .   Assessment and Plan:    1. Women's annual routine gynecological examination  Pap: not due Mammogram : not indicated Labs: Hep C, Vit D, Lipid, Covid  antibody testing.  Refills: none Referral: none, pt will send my chart message to , will reach out if she needs referral for dysparunia.  Routine preventative health maintenance measures emphasized. Please refer to After Visit Summary for other counseling recommendations.      Flora Lipps, CNM [facility],  [facility]

## 2020-11-29 NOTE — Patient Instructions (Signed)
Preventive Care 21-35 Years Old, Female Preventive care refers to visits with your health care provider and lifestyle choices that can promote health and wellness. This includes:  A yearly physical exam. This may also be called an annual well check.  Regular dental visits and eye exams.  Immunizations.  Screening for certain conditions.  Healthy lifestyle choices, such as eating a healthy diet, getting regular exercise, not using drugs or products that contain nicotine and tobacco, and limiting alcohol use. What can I expect for my preventive care visit? Physical exam Your health care provider will check your:  Height and weight. This may be used to calculate body mass index (BMI), which tells if you are at a healthy weight.  Heart rate and blood pressure.  Skin for abnormal spots. Counseling Your health care provider may ask you questions about your:  Alcohol, tobacco, and drug use.  Emotional well-being.  Home and relationship well-being.  Sexual activity.  Eating habits.  Work and work environment.  Method of birth control.  Menstrual cycle.  Pregnancy history. What immunizations do I need?  Influenza (flu) vaccine  This is recommended every year. Tetanus, diphtheria, and pertussis (Tdap) vaccine  You may need a Td booster every 10 years. Varicella (chickenpox) vaccine  You may need this if you have not been vaccinated. Human papillomavirus (HPV) vaccine  If recommended by your health care provider, you may need three doses over 6 months. Measles, mumps, and rubella (MMR) vaccine  You may need at least one dose of MMR. You may also need a second dose. Meningococcal conjugate (MenACWY) vaccine  One dose is recommended if you are age 19-21 years and a first-year college student living in a residence hall, or if you have one of several medical conditions. You may also need additional booster doses. Pneumococcal conjugate (PCV13) vaccine  You may need  this if you have certain conditions and were not previously vaccinated. Pneumococcal polysaccharide (PPSV23) vaccine  You may need one or two doses if you smoke cigarettes or if you have certain conditions. Hepatitis A vaccine  You may need this if you have certain conditions or if you travel or work in places where you may be exposed to hepatitis A. Hepatitis B vaccine  You may need this if you have certain conditions or if you travel or work in places where you may be exposed to hepatitis B. Haemophilus influenzae type b (Hib) vaccine  You may need this if you have certain conditions. You may receive vaccines as individual doses or as more than one vaccine together in one shot (combination vaccines). Talk with your health care provider about the risks and benefits of combination vaccines. What tests do I need?  Blood tests  Lipid and cholesterol levels. These may be checked every 5 years starting at age 20.  Hepatitis C test.  Hepatitis B test. Screening  Diabetes screening. This is done by checking your blood sugar (glucose) after you have not eaten for a while (fasting).  Sexually transmitted disease (STD) testing.  BRCA-related cancer screening. This may be done if you have a family history of breast, ovarian, tubal, or peritoneal cancers.  Pelvic exam and Pap test. This may be done every 3 years starting at age 21. Starting at age 30, this may be done every 5 years if you have a Pap test in combination with an HPV test. Talk with your health care provider about your test results, treatment options, and if necessary, the need for more tests.   Follow these instructions at home: Eating and drinking   Eat a diet that includes fresh fruits and vegetables, whole grains, lean protein, and low-fat dairy.  Take vitamin and mineral supplements as recommended by your health care provider.  Do not drink alcohol if: ? Your health care provider tells you not to drink. ? You are  pregnant, may be pregnant, or are planning to become pregnant.  If you drink alcohol: ? Limit how much you have to 0-1 drink a day. ? Be aware of how much alcohol is in your drink. In the [place] one drink equals one 12 oz bottle of beer (355 mL), one 5 oz glass of wine (148 mL), or one 1 oz glass of hard liquor (44 mL). Lifestyle  Take daily care of your teeth and gums.  Stay active. Exercise for at least 30 minutes on 5 or more days each week.  Do not use any products that contain nicotine or tobacco, such as cigarettes, e-cigarettes, and chewing tobacco. If you need help quitting, ask your health care provider.  If you are sexually active, practice safe sex. Use a condom or other form of birth control (contraception) in order to prevent pregnancy and STIs (sexually transmitted infections). If you plan to become pregnant, see your health care provider for a preconception visit. What's next?  Visit your health care provider once a year for a well check visit.  Ask your health care provider how often you should have your eyes and teeth checked.  Stay up to date on all vaccines. This information is not intended to replace advice given to you by your health care provider. Make sure you discuss any questions you have with your health care provider. Document Revised: 08/28/2018 Document Reviewed: 08/28/2018 Elsevier Patient Education  2020 [employer].

## 2020-11-30 LAB — VITAMIN D 25 HYDROXY (VIT D DEFICIENCY, FRACTURES): Vit D, 25-Hydroxy: 65.5 ng/mL (ref 30.0–100.0)

## 2020-11-30 LAB — LIPID PANEL
Chol/HDL Ratio: 3.5 ratio (ref 0.0–4.4)
Cholesterol, Total: 179 mg/dL (ref 100–199)
HDL: 51 mg/dL (ref >39)
LDL Chol Calc (NIH): 110 mg/dL — ABNORMAL HIGH (ref 0–99)
Triglycerides: 101 mg/dL (ref 0–149)
VLDL Cholesterol Cal: 18 mg/dL (ref 5–40)

## 2020-11-30 LAB — SAR COV2 SEROLOGY (COVID19)AB(IGG),IA
SARS-CoV-2 Semi-Quant IgG Ab: 49.8 AU/mL (ref <13.0)
SARS-CoV-2 Spike Ab Interp: POSITIVE

## 2020-11-30 LAB — HEPATITIS C ANTIBODY: Hep C Virus Ab: 0.2 s/co ratio (ref 0.0–0.9)

## 2021-01-30 ENCOUNTER — Encounter: Payer: Self-pay | Admitting: Internal Medicine

## 2021-01-30 ENCOUNTER — Telehealth (INDEPENDENT_AMBULATORY_CARE_PROVIDER_SITE_OTHER): Payer: No Typology Code available for payment source | Admitting: Internal Medicine

## 2021-01-30 VITALS — Ht 62.01 in | Wt 127.0 lb

## 2021-01-30 DIAGNOSIS — L659 Nonscarring hair loss, unspecified: Secondary | ICD-10-CM | POA: Diagnosis not present

## 2021-01-30 DIAGNOSIS — L650 Telogen effluvium: Secondary | ICD-10-CM | POA: Insufficient documentation

## 2021-01-30 DIAGNOSIS — Z8616 Personal history of COVID-19: Secondary | ICD-10-CM

## 2021-01-30 NOTE — Assessment & Plan Note (Signed)
In October 2021.  Infection was mild,  She has declined vaccination .  She is requesting repeat ab level

## 2021-01-30 NOTE — Progress Notes (Signed)
Virtual Visit via Caregility  This visit type was conducted due to national recommendations for restrictions regarding the COVID-19 pandemic (e.g. social distancing).  This format is felt to be most appropriate for this patient at this time.  All issues noted in this document were discussed and addressed.  No physical exam was performed (except for noted visual exam findings with Video Visits).   I connected with@ on 01/30/21 at  4:00 PM EST by a video enabled telemedicine application  and verified that I am speaking with the correct person using two identifiers. Location patient: home Location provider: work or home office Persons participating in the virtual visit: patient, provider  I discussed the limitations, risks, security and privacy concerns of performing an evaluation and management service by telephone and the availability of in person appointments. I also discussed with the patient that there may be a patient responsible charge related to this service. The patient expressed understanding and agreed to proceed.  Reason for visit: hair loss  HPI:  35 yr old healthy female presents with increased shedding of hair that started in  [employer] December.  Notes increased hair in the brush,  On the floor, and in the shower.  Denies any bald patches,  No scalp pain ,  Itching or dermatitis.  Uses curling iron once a week,  Washes hair not more than every other day.  No new products.  Denies other thyroid symptoms.  Had a COVID INFECTION in October.  Taking collagen,  Zinc. vitamin D.    Considering adding copper supplementation.    ROS: See pertinent positives and negatives per HPI.  Past Medical History:  Diagnosis Date  . Acute bronchitis due to COVID-19 virus 10/17/2020  . AR (allergic rhinitis)   . Hemorrhoids in pregnancy   . History of renal calculi    with hydroneprosis noted during pregnancy  . Hydronephrosis   . Motion sickness   . UTI (lower urinary tract infection)     Past  Surgical History:  Procedure Laterality Date  . CESAREAN SECTION  2012    Family History  Problem Relation Age of Onset  . Mental illness Mother   . Colitis Mother        collagenous  . Colon polyps Mother   . Heart disease Father 53       CAD s/p CABG  . Diabetes Father   . Skin cancer Father   . Diabetes Maternal Grandmother   . Hyperlipidemia Maternal Grandmother   . Stroke Maternal Grandmother   . Stroke Paternal Grandmother   . Celiac disease Sister   . Cancer Neg Hx     SOCIAL HX:  reports that she has never smoked. She has never used smokeless tobacco. She reports that she does not drink alcohol and does not use drugs.   Current Outpatient Medications:  .  azelastine (ASTELIN) 0.1 % nasal spray, Place into both nostrils., Disp: , Rfl:  .  Biotin 10 MG CAPS, Take 10 mg by mouth daily. , Disp: , Rfl:  .  cetirizine (ZYRTEC) 10 MG tablet, Take 1 tablet (10 mg total) daily by mouth., Disp: 90 tablet, Rfl: 4 .  Cholecalciferol (VITAMIN D3) 1.25 MG (50000 UT) TABS, Take by mouth., Disp: , Rfl:  .  Cyanocobalamin (VITAMIN B-12 PO), Take by mouth daily in the afternoon., Disp: , Rfl:  .  levonorgestrel (MIRENA) 20 MCG/24HR IUD, 1 each by Intrauterine route once., Disp: , Rfl:  .  meclizine (ANTIVERT) 25 MG tablet, Take  25 mg by mouth daily as needed., Disp: , Rfl:  .  Omega-3 Fatty Acids (FISH OIL) 1000 MG CAPS, Take by mouth., Disp: , Rfl:  .  tiZANidine (ZANAFLEX) 4 MG capsule, Take 1 capsule (4 mg total) by mouth 3 (three) times daily as needed for muscle spasms., Disp: 60 capsule, Rfl: 1 .  Zinc 50 MG TABS, Take by mouth daily in the afternoon., Disp: , Rfl:   EXAM:  VITALS per patient if applicable:  GENERAL: alert, oriented, appears well and in no acute distress  HEENT: atraumatic, conjunttiva clear, no obvious abnormalities on inspection of external nose and ears  NECK: normal movements of the head and neck  LUNGS: on inspection no signs of respiratory distress,  breathing rate appears normal, no obvious gross SOB, gasping or wheezing  CV: no obvious cyanosis  MS: moves all visible extremities without noticeable abnormality  PSYCH/NEURO: pleasant and cooperative, no obvious depression or anxiety, speech and thought processing grossly intact  ASSESSMENT AND PLAN:  Discussed the following assessment and plan:  Loss of hair - Plan: TSH, DHEA-sulfate, Testosterone, Ferritin, Iron and TIBC  Personal history of COVID-19 - Plan: SARS-CoV-2 Semi-Quantitative Total Antibody, Spike  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  Patient requesting metabolic workup,  Labs ordered.  Advised against ADDING A COPPER SUPPlement T(SUGGESTED BY HER HAIRDRESSER)  Personal history of COVID-19 In October 2021.  Infection was mild,  She has declined vaccination .  She is requesting repeat ab level     I discussed the assessment and treatment plan with the patient. The patient was provided an opportunity to ask questions and all were answered. The patient agreed with the plan and demonstrated an understanding of the instructions.   The patient was advised to call back or seek an in-person evaluation if the symptoms worsen or if the condition fails to improve as anticipated.   I provided  30 minutes of  face-to-face time during this encounter reviewing patient's current problems and past surgeries, labs and imaging studies, providing counseling on the above mentioned problems , and coordination  of care . Sherlene Shams, MD

## 2021-01-30 NOTE — Patient Instructions (Signed)
Telogen Effluvium  Telogen effluvium is a condition in which the body sheds much hair. People describe that they are losing hair "from the roots" in excessive amounts. The hair loss is usually 3 to 6 months following an event. The inciting event may be childbirth, a surgery, an illness with a fever, a traumatic psychological event, weight loss, or the start of a new medication. Some people have no known inciting event. For most people, no treatment is necessary, and the hair will stop falling out and begin to regrow with time. Some women develop a chronic form of telogen effluvium in which they continue to lose hair at an accelerated rate.  

## 2021-01-30 NOTE — Assessment & Plan Note (Signed)
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  Patient requesting metabolic workup,  Labs ordered.  Advised against ADDING A COPPER SUPPlement T(SUGGESTED BY HER HAIRDRESSER)

## 2021-01-31 ENCOUNTER — Other Ambulatory Visit: Payer: Self-pay

## 2021-01-31 ENCOUNTER — Other Ambulatory Visit
Admission: RE | Admit: 2021-01-31 | Discharge: 2021-01-31 | Disposition: A | Discharge status: Home / Self Care | Payer: No Typology Code available for payment source | Attending: Internal Medicine | Admitting: Internal Medicine

## 2021-01-31 DIAGNOSIS — Z0184 Encounter for antibody response examination: Secondary | ICD-10-CM | POA: Diagnosis not present

## 2021-01-31 DIAGNOSIS — Z79899 Other long term (current) drug therapy: Secondary | ICD-10-CM | POA: Insufficient documentation

## 2021-01-31 DIAGNOSIS — L659 Nonscarring hair loss, unspecified: Secondary | ICD-10-CM | POA: Insufficient documentation

## 2021-01-31 DIAGNOSIS — Z8616 Personal history of COVID-19: Secondary | ICD-10-CM | POA: Insufficient documentation

## 2021-01-31 DIAGNOSIS — L650 Telogen effluvium: Secondary | ICD-10-CM | POA: Diagnosis not present

## 2021-01-31 LAB — IRON AND TIBC
Iron: 67 ug/dL (ref 28–170)
Saturation Ratios: 13 % (ref 10.4–31.8)
TIBC: 511 ug/dL — ABNORMAL HIGH (ref 250–450)
UIBC: 444 ug/dL

## 2021-01-31 LAB — FERRITIN: Ferritin: 16 ng/mL (ref 11–307)

## 2021-01-31 LAB — TSH: TSH: 2.875 u[IU]/mL (ref 0.350–4.500)

## 2021-02-01 LAB — MISC LABCORP TEST (SEND OUT): Labcorp test code: 164090

## 2021-02-01 LAB — TESTOSTERONE: Testosterone: <3 ng/dL — ABNORMAL LOW (ref 8–60)

## 2021-02-01 LAB — DHEA-SULFATE: DHEA-SO4: 83.6 ug/dL (ref 57.3–279.2)

## 2021-02-14 ENCOUNTER — Other Ambulatory Visit: Payer: Self-pay

## 2021-02-14 ENCOUNTER — Ambulatory Visit (INDEPENDENT_AMBULATORY_CARE_PROVIDER_SITE_OTHER): Payer: No Typology Code available for payment source | Admitting: Internal Medicine

## 2021-02-14 ENCOUNTER — Other Ambulatory Visit: Payer: Self-pay | Admitting: Internal Medicine

## 2021-02-14 ENCOUNTER — Encounter: Payer: Self-pay | Admitting: Internal Medicine

## 2021-02-14 ENCOUNTER — Ambulatory Visit: Payer: No Typology Code available for payment source

## 2021-02-14 VITALS — BP 122/80 | HR 90 | Temp 98.4°F | Ht 62.01 in | Wt 133.6 lb

## 2021-02-14 DIAGNOSIS — M25512 Pain in left shoulder: Secondary | ICD-10-CM

## 2021-02-14 DIAGNOSIS — M25511 Pain in right shoulder: Secondary | ICD-10-CM

## 2021-02-14 DIAGNOSIS — K2100 Gastro-esophageal reflux disease with esophagitis, without bleeding: Secondary | ICD-10-CM | POA: Diagnosis not present

## 2021-02-14 DIAGNOSIS — M542 Cervicalgia: Secondary | ICD-10-CM

## 2021-02-14 DIAGNOSIS — M62838 Other muscle spasm: Secondary | ICD-10-CM | POA: Diagnosis not present

## 2021-02-14 MED ORDER — TIZANIDINE HCL 2 MG PO CAPS: 2.0000 mg | ORAL_CAPSULE | Freq: Every evening | ORAL | 2 refills | Status: DC | PRN

## 2021-02-14 MED ORDER — OMEPRAZOLE 20 MG PO CPDR: 20.0000 mg | DELAYED_RELEASE_CAPSULE | Freq: Every day | ORAL | 2 refills | Status: DC | PRN

## 2021-02-14 MED ORDER — MELOXICAM 15 MG PO TABS: 15.0000 mg | ORAL_TABLET | Freq: Every day | ORAL | 1 refills | Status: DC | PRN

## 2021-02-14 NOTE — Patient Instructions (Signed)
Cervical Strain and Sprain Rehab Ask your health care provider which exercises are safe for you. Do exercises exactly as told by your health care provider and adjust them as directed. It is normal to feel mild stretching, pulling, tightness, or discomfort as you do these exercises. Stop right away if you feel sudden pain or your pain gets worse. Do not begin these exercises until told by your health care provider. Stretching and range-of-motion exercises Cervical side bending 1. Using good posture, sit on a stable chair or stand up. 2. Without moving your shoulders, slowly tilt your left / right ear to your shoulder until you feel a stretch in the opposite side neck muscles. You should be looking straight ahead. 3. Hold for __________ seconds. 4. Repeat with the other side of your neck. Repeat __________ times. Complete this exercise __________ times a day.   Cervical rotation 1. Using good posture, sit on a stable chair or stand up. 2. Slowly turn your head to the side as if you are looking over your left / right shoulder. ? Keep your eyes level with the ground. ? Stop when you feel a stretch along the side and the back of your neck. 3. Hold for __________ seconds. 4. Repeat this by turning to your other side. Repeat __________ times. Complete this exercise __________ times a day.   Thoracic extension and pectoral stretch 1. Roll a towel or a small blanket so it is about 4 inches (10 cm) in diameter. 2. Lie down on your back on a firm surface. 3. Put the towel lengthwise, under your spine in the middle of your back. It should not be under your shoulder blades. The towel should line up with your spine from your middle back to your lower back. 4. Put your hands behind your head and let your elbows fall out to your sides. 5. Hold for __________ seconds. Repeat __________ times. Complete this exercise __________ times a day. Strengthening exercises Isometric upper cervical flexion 1. Lie on  your back with a thin pillow behind your head and a small rolled-up towel under your neck. 2. Gently tuck your chin toward your chest and nod your head down to look toward your feet. Do not lift your head off the pillow. 3. Hold for __________ seconds. 4. Release the tension slowly. Relax your neck muscles completely before you repeat this exercise. Repeat __________ times. Complete this exercise __________ times a day. Isometric cervical extension 1. Stand about 6 inches (15 cm) away from a wall, with your back facing the wall. 2. Place a soft object, about 6-8 inches (15-20 cm) in diameter, between the back of your head and the wall. A soft object could be a small pillow, a ball, or a folded towel. 3. Gently tilt your head back and press into the soft object. Keep your jaw and forehead relaxed. 4. Hold for __________ seconds. 5. Release the tension slowly. Relax your neck muscles completely before you repeat this exercise. Repeat __________ times. Complete this exercise __________ times a day.   Posture and body mechanics Body mechanics refers to the movements and positions of your body while you do your daily activities. Posture is part of body mechanics. Good posture and healthy body mechanics can help to relieve stress in your body's tissues and joints. Good posture means that your spine is in its natural S-curve position (your spine is neutral), your shoulders are pulled back slightly, and your head is not tipped forward. The following are general guidelines  for applying improved posture and body mechanics to your everyday activities. Sitting 1. When sitting, keep your spine neutral and keep your feet flat on the floor. Use a footrest, if necessary, and keep your thighs parallel to the floor. Avoid rounding your shoulders, and avoid tilting your head forward. 2. When working at a desk or a computer, keep your desk at a height where your hands are slightly lower than your elbows. Slide your  chair under your desk so you are close enough to maintain good posture. 3. When working at a computer, place your monitor at a height where you are looking straight ahead and you do not have to tilt your head forward or downward to look at the screen.   Standing  When standing, keep your spine neutral and keep your feet about hip-width apart. Keep a slight bend in your knees. Your ears, shoulders, and hips should line up.  When you do a task in which you stand in one place for a long time, place one foot up on a stable object that is 2-4 inches (5-10 cm) high, such as a footstool. This helps keep your spine neutral.   Resting When lying down and resting, avoid positions that are most painful for you. Try to support your neck in a neutral position. You can use a contour pillow or a small rolled-up towel. Your pillow should support your neck but not push on it. This information is not intended to replace advice given to you by your health care provider. Make sure you discuss any questions you have with your health care provider. Document Revised: 04/08/2019 Document Reviewed: 09/17/2018 [employer] Patient Education  2021 [employer]  Neck Exercises Ask your health care provider which exercises are safe for you. Do exercises exactly as told by your health care provider and adjust them as directed. It is normal to feel mild stretching, pulling, tightness, or discomfort as you do these exercises. Stop right away if you feel sudden pain or your pain gets worse. Do not begin these exercises until told by your health care provider. Neck exercises can be important for many reasons. They can improve strength and maintain flexibility in your neck, which will help your upper back and prevent neck pain. Stretching exercises Rotation neck stretching 5. Sit in a chair or stand up. 6. Place your feet flat on the floor, shoulder width apart. 7. Slowly turn your head (rotate) to the right until a slight stretch  is felt. Turn it all the way to the right so you can look over your right shoulder. Do not tilt or tip your head. 8. Hold this position for 10-30 seconds. 9. Slowly turn your head (rotate) to the left until a slight stretch is felt. Turn it all the way to the left so you can look over your left shoulder. Do not tilt or tip your head. 10. Hold this position for 10-30 seconds. Repeat __________ times. Complete this exercise __________ times a day.   Neck retraction 5. Sit in a sturdy chair or stand up. 6. Look straight ahead. Do not bend your neck. 7. Use your fingers to push your chin backward (retraction). Do not bend your neck for this movement. Continue to face straight ahead. If you are doing the exercise properly, you will feel a slight sensation in your throat and a stretch at the back of your neck. 8. Hold the stretch for 1-2 seconds. Repeat __________ times. Complete this exercise __________ times a day. Strengthening exercises  Neck press 6. Lie on your back on a firm bed or on the floor with a pillow under your head. 7. Use your neck muscles to push your head down on the pillow and straighten your spine. 8. Hold the position as well as you can. Keep your head facing up (in a neutral position) and your chin tucked. 9. Slowly count to 5 while holding this position. Repeat __________ times. Complete this exercise __________ times a day. Isometrics These are exercises in which you strengthen the muscles in your neck while keeping your neck still (isometrics). 5. Sit in a supportive chair and place your hand on your forehead. 6. Keep your head and face facing straight ahead. Do not flex or extend your neck while doing isometrics. 7. Push forward with your head and neck while pushing back with your hand. Hold for 10 seconds. 8. Do the sequence again, this time putting your hand against the back of your head. Use your head and neck to push backward against the hand pressure. 9. Finally, do  the same exercise on either side of your head, pushing sideways against the pressure of your hand. Repeat __________ times. Complete this exercise __________ times a day. Prone head lifts 6. Lie face-down (prone position), resting on your elbows so that your chest and upper back are raised. 7. Start with your head facing downward, near your chest. Position your chin either on or near your chest. 8. Slowly lift your head upward. Lift until you are looking straight ahead. Then continue lifting your head as far back as you can comfortably stretch. 9. Hold your head up for 5 seconds. Then slowly lower it to your starting position. Repeat __________ times. Complete this exercise __________ times a day. Supine head lifts 4. Lie on your back (supine position), bending your knees to point to the ceiling and keeping your feet flat on the floor. 5. Lift your head slowly off the floor, raising your chin toward your chest. 6. Hold for 5 seconds. Repeat __________ times. Complete this exercise __________ times a day. Scapular retraction 1. Stand with your arms at your sides. Look straight ahead. 2. Slowly pull both shoulders (scapulae) backward and downward (retraction) until you feel a stretch between your shoulder blades in your upper back. 3. Hold for 10-30 seconds. 4. Relax and repeat. Repeat __________ times. Complete this exercise __________ times a day. Contact a health care provider if:  Your neck pain or discomfort gets much worse when you do an exercise.  Your neck pain or discomfort does not improve within 2 hours after you exercise. If you have any of these problems, stop exercising right away. Do not do the exercises again unless your health care provider says that you can. Get help right away if:  You develop sudden, severe neck pain. If this happens, stop exercising right away. Do not do the exercises again unless your health care provider says that you can. This information is not  intended to replace advice given to you by your health care provider. Make sure you discuss any questions you have with your health care provider. Document Revised: 10/15/2018 Document Reviewed: 10/15/2018 [employer] Patient Education  2021 [employer]  Shoulder Exercises Ask your health care provider which exercises are safe for you. Do exercises exactly as told by your health care provider and adjust them as directed. It is normal to feel mild stretching, pulling, tightness, or discomfort as you do these exercises. Stop right away if you feel sudden pain  or your pain gets worse. Do not begin these exercises until told by your health care provider. Stretching exercises External rotation and abduction This exercise is sometimes called corner stretch. This exercise rotates your arm outward (external rotation) and moves your arm out from your body (abduction). 11. Stand in a doorway with one of your feet slightly in front of the other. This is called a staggered stance. If you cannot reach your forearms to the door frame, stand facing a corner of a room. 12. Choose one of the following positions as told by your health care provider: ? Place your hands and forearms on the door frame above your head. ? Place your hands and forearms on the door frame at the height of your head. ? Place your hands on the door frame at the height of your elbows. 13. Slowly move your weight onto your front foot until you feel a stretch across your chest and in the front of your shoulders. Keep your head and chest upright and keep your abdominal muscles tight. 14. Hold for __________ seconds. 15. To release the stretch, shift your weight to your back foot. Repeat __________ times. Complete this exercise __________ times a day.   Extension, standing 9. Stand and hold a broomstick, a cane, or a similar object behind your back. ? Your hands should be a little wider than shoulder width apart. ? Your palms should face away  from your back. 10. Keeping your elbows straight and your shoulder muscles relaxed, move the stick away from your body until you feel a stretch in your shoulders (extension). ? Avoid shrugging your shoulders while you move the stick. Keep your shoulder blades tucked down toward the middle of your back. 11. Hold for __________ seconds. 12. Slowly return to the starting position. Repeat __________ times. Complete this exercise __________ times a day. Range-of-motion exercises Pendulum 10. Stand near a wall or a surface that you can hold onto for balance. 11. Bend at the waist and let your left / right arm hang straight down. Use your other arm to support you. Keep your back straight and do not lock your knees. 12. Relax your left / right arm and shoulder muscles, and move your hips and your trunk so your left / right arm swings freely. Your arm should swing because of the motion of your body, not because you are using your arm or shoulder muscles. 13. Keep moving your hips and trunk so your arm swings in the following directions, as told by your health care provider: ? Side to side. ? Forward and backward. ? In clockwise and counterclockwise circles. 14. Continue each motion for __________ seconds, or for as long as told by your health care provider. 15. Slowly return to the starting position. Repeat __________ times. Complete this exercise __________ times a day.   Shoulder flexion, standing 10. Stand and hold a broomstick, a cane, or a similar object. Place your hands a little more than shoulder width apart on the object. Your left / right hand should be palm up, and your other hand should be palm down. 11. Keep your elbow straight and your shoulder muscles relaxed. Push the stick up with your healthy arm to raise your left / right arm in front of your body, and then over your head until you feel a stretch in your shoulder (flexion). ? Avoid shrugging your shoulder while you raise your arm. Keep  your shoulder blade tucked down toward the middle of your back. 12. Hold  for __________ seconds. 13. Slowly return to the starting position. Repeat __________ times. Complete this exercise __________ times a day.   Shoulder abduction, standing 10. Stand and hold a broomstick, a cane, or a similar object. Place your hands a little more than shoulder width apart on the object. Your left / right hand should be palm up, and your other hand should be palm down. 11. Keep your elbow straight and your shoulder muscles relaxed. Push the object across your body toward your left / right side. Raise your left / right arm to the side of your body (abduction) until you feel a stretch in your shoulder. ? Do not raise your arm above shoulder height unless your health care provider tells you to do that. ? If directed, raise your arm over your head. ? Avoid shrugging your shoulder while you raise your arm. Keep your shoulder blade tucked down toward the middle of your back. 12. Hold for __________ seconds. 13. Slowly return to the starting position. Repeat __________ times. Complete this exercise __________ times a day. Internal rotation 7. Place your left / right hand behind your back, palm up. 8. Use your other hand to dangle an exercise band, a towel, or a similar object over your shoulder. Grasp the band with your left / right hand so you are holding on to both ends. 9. Gently pull up on the band until you feel a stretch in the front of your left / right shoulder. The movement of your arm toward the center of your body is called internal rotation. ? Avoid shrugging your shoulder while you raise your arm. Keep your shoulder blade tucked down toward the middle of your back. 10. Hold for __________ seconds. 11. Release the stretch by letting go of the band and lowering your hands. Repeat __________ times. Complete this exercise __________ times a day.   Strengthening exercises External rotation 5. Sit in a  stable chair without armrests. 6. Secure an exercise band to a stable object at elbow height on your left / right side. 7. Place a soft object, such as a folded towel or a small pillow, between your left / right upper arm and your body to move your elbow about 4 inches (10 cm) away from your side. 8. Hold the end of the exercise band so it is tight and there is no slack. 9. Keeping your elbow pressed against the soft object, slowly move your forearm out, away from your abdomen (external rotation). Keep your body steady so only your forearm moves. 10. Hold for __________ seconds. 11. Slowly return to the starting position. Repeat __________ times. Complete this exercise __________ times a day.   Shoulder abduction 1. Sit in a stable chair without armrests, or stand up. 2. Hold a __________ weight in your left / right hand, or hold an exercise band with both hands. 3. Start with your arms straight down and your left / right palm facing in, toward your body. 4. Slowly lift your left / right hand out to your side (abduction). Do not lift your hand above shoulder height unless your health care provider tells you that this is safe. ? Keep your arms straight. ? Avoid shrugging your shoulder while you do this movement. Keep your shoulder blade tucked down toward the middle of your back. 5. Hold for __________ seconds. 6. Slowly lower your arm, and return to the starting position. Repeat __________ times. Complete this exercise __________ times a day.   Shoulder extension 1. Sit  in a stable chair without armrests, or stand up. 2. Secure an exercise band to a stable object in front of you so it is at shoulder height. 3. Hold one end of the exercise band in each hand. Your palms should face each other. 4. Straighten your elbows and lift your hands up to shoulder height. 5. Step back, away from the secured end of the exercise band, until the band is tight and there is no slack. 6. Squeeze your shoulder  blades together as you pull your hands down to the sides of your thighs (extension). Stop when your hands are straight down by your sides. Do not let your hands go behind your body. 7. Hold for __________ seconds. 8. Slowly return to the starting position. Repeat __________ times. Complete this exercise __________ times a day. Shoulder row 1. Sit in a stable chair without armrests, or stand up. 2. Secure an exercise band to a stable object in front of you so it is at waist height. 3. Hold one end of the exercise band in each hand. Position your palms so that your thumbs are facing the ceiling (neutral position). 4. Bend each of your elbows to a 90-degree angle (right angle) and keep your upper arms at your sides. 5. Step back until the band is tight and there is no slack. 6. Slowly pull your elbows back behind you. 7. Hold for __________ seconds. 8. Slowly return to the starting position. Repeat __________ times. Complete this exercise __________ times a day. Shoulder press-ups 1. Sit in a stable chair that has armrests. Sit upright, with your feet flat on the floor. 2. Put your hands on the armrests so your elbows are bent and your fingers are pointing forward. Your hands should be about even with the sides of your body. 3. Push down on the armrests and use your arms to lift yourself off the chair. Straighten your elbows and lift yourself up as much as you comfortably can. ? Move your shoulder blades down, and avoid letting your shoulders move up toward your ears. ? Keep your feet on the ground. As you get stronger, your feet should support less of your body weight as you lift yourself up. 4. Hold for __________ seconds. 5. Slowly lower yourself back into the chair. Repeat __________ times. Complete this exercise __________ times a day.   Wall push-ups 1. Stand so you are facing a stable wall. Your feet should be about one arm-length away from the wall. 2. Lean forward and place your palms  on the wall at shoulder height. 3. Keep your feet flat on the floor as you bend your elbows and lean forward toward the wall. 4. Hold for __________ seconds. 5. Straighten your elbows to push yourself back to the starting position. Repeat __________ times. Complete this exercise __________ times a day.   This information is not intended to replace advice given to you by your health care provider. Make sure you discuss any questions you have with your health care provider. Document Revised: 04/10/2019 Document Reviewed: 01/16/2019 [employer] Patient Education  2021 [employer].

## 2021-02-14 NOTE — Progress Notes (Signed)
Patient presenting with bilateral neck and shoulder pain. Onset of 2 months ago when having a massage. States she was told she has knots in the muscle of the shoulder and neck. States the left side hurts the worst.   No know injuries or falls. Patient does work out and lifting things at work.

## 2021-02-14 NOTE — Progress Notes (Signed)
Chief Complaint  Patient presents with  . Shoulder Pain  . Neck Pain   F/u  1. 5/10 Trapezius pain and neck pain left worse than right x 1 month and radiating to b/l shoulders and some pain with ROM  she has had a massage and muscle has spasm she is interested in dry needling Wes Rissell [facility] on [employer] [phone]  Tried zanaflex 4 mg qd prn and mobic 15 mg and omeprazole 20 mg  She wants referral to PT for this as dry needling helped her pelvic floor pain/issues   Review of Systems  Respiratory: Negative for shortness of breath.   Cardiovascular: Negative for chest pain.  Musculoskeletal: Positive for neck pain.   Past Medical History:  Diagnosis Date  . Acute bronchitis due to COVID-19 virus 10/17/2020  . AR (allergic rhinitis)   . Hemorrhoids in pregnancy   . History of renal calculi    with hydroneprosis noted during pregnancy  . Hydronephrosis   . Motion sickness   . UTI (lower urinary tract infection)    Past Surgical History:  Procedure Laterality Date  . CESAREAN SECTION  2012   Family History  Problem Relation Age of Onset  . Mental illness Mother   . Colitis Mother        collagenous  . Colon polyps Mother   . Heart disease Father 66       CAD s/p CABG  . Diabetes Father   . Skin cancer Father   . Diabetes Maternal Grandmother   . Hyperlipidemia Maternal Grandmother   . Stroke Maternal Grandmother   . Stroke Paternal Grandmother   . Celiac disease Sister   . Cancer Neg Hx    Social History   Socioeconomic History  . Marital status: Married    Spouse name: Not on file  . Number of children: Not on file  . Years of education: Not on file  . Highest education level: Not on file  Occupational History  . Not on file  Tobacco Use  . Smoking status: Never Smoker  . Smokeless tobacco: Never Used  Vaping Use  . Vaping Use: Never used  Substance and Sexual Activity  . Alcohol use: No  . Drug use: No  . Sexual activity: Yes     Birth control/protection: I.U.D.    Comment: Mirena  Other Topics Concern  . Not on file  Social History Narrative  . Not on file   Social Determinants of Health   Financial Resource Strain: Not on file  Food Insecurity: Not on file  Transportation Needs: Not on file  Physical Activity: Not on file  Stress: Not on file  Social Connections: Not on file  Intimate Partner Violence: Not on file   Current Meds  Medication Sig  . azelastine (ASTELIN) 0.1 % nasal spray Place into both nostrils.  . Biotin 10 MG CAPS Take 10 mg by mouth daily.   . cetirizine (ZYRTEC) 10 MG tablet Take 1 tablet (10 mg total) daily by mouth.  . Cholecalciferol (VITAMIN D3) 1.25 MG (50000 UT) TABS Take by mouth.  . Cyanocobalamin (VITAMIN B-12 PO) Take by mouth daily in the afternoon.  Marland Kitchen levonorgestrel (MIRENA) 20 MCG/24HR IUD 1 each by Intrauterine route once.  . meclizine (ANTIVERT) 25 MG tablet Take 25 mg by mouth daily as needed.  . meloxicam (MOBIC) 15 MG tablet Take 1 tablet (15 mg total) by mouth daily as needed for pain.  . Omega-3 Fatty  Acids (FISH OIL) 1000 MG CAPS Take by mouth.  . Zinc 50 MG TABS Take by mouth daily in the afternoon.  . [DISCONTINUED] omeprazole (PRILOSEC) 20 MG capsule Take 20 mg by mouth daily.  . [DISCONTINUED] tiZANidine (ZANAFLEX) 4 MG capsule Take 1 capsule (4 mg total) by mouth 3 (three) times daily as needed for muscle spasms.   Allergies  Allergen Reactions  . Sulfa Antibiotics Rash   Recent Results (from the past 2160 hour(s))  Lipid Profile     Status: Abnormal   Collection Time: 11/29/20  9:38 AM  Result Value Ref Range   Cholesterol, Total 179 100 - 199 mg/dL   Triglycerides 323 0 - 149 mg/dL   HDL 51 >55 mg/dL   VLDL Cholesterol Cal 18 5 - 40 mg/dL   LDL Chol Calc (NIH) 732 (H) 0 - 99 mg/dL   Chol/HDL Ratio 3.5 0.0 - 4.4 ratio    Comment:                                   T. Chol/HDL Ratio                                             Men  Women                                1/2 Avg.Risk  3.4    3.3                                   Avg.Risk  5.0    4.4                                2X Avg.Risk  9.6    7.1                                3X Avg.Risk 23.4   11.0   Vitamin D (25 hydroxy)     Status: None   Collection Time: 11/29/20  9:38 AM  Result Value Ref Range   Vit D, 25-Hydroxy 65.5 30.0 - 100.0 ng/mL    Comment: Vitamin D deficiency has been defined by the Institute of Medicine and an Endocrine Society practice guideline as a level of serum 25-OH vitamin D less than 20 ng/mL (1,2). The Endocrine Society went on to further define vitamin D insufficiency as a level between 21 and 29 ng/mL (2). 1. IOM (Institute of Medicine). 2010. Dietary reference    intakes for calcium and D. [place]: The    [employer]. 2. Holick MF, Binkley [place], Bischoff-Ferrari HA, et al.    Evaluation, treatment, and prevention of vitamin D    deficiency: an Endocrine Society clinical practice    guideline. JCEM. 2011 Jul; 96(7):1911-30.   SAR CoV2 Serology (COVID 19)AB(IGG)IA     Status: None   Collection Time: 11/29/20  9:38 AM  Result Value Ref Range   SARS-CoV-2 Semi-Quant IgG Ab 49.8 Neg <13.0 AU/mL   SARS-CoV-2 Spike Ab Interp Positive     Comment: Antibodies  against the SARS-CoV-2 spike protein, including the receptor binding domain (RBD) were detected. It is not yet known what level of antibody to SARS-CoV-2 spike protein correlates to immunity against developing symptomatic SARS-CoV-2 disease. This assay was performed using D[employer] Liaison(R) SARS-CoV-2 Trimeric S IgG assay.   Hepatitis C Antibody     Status: None   Collection Time: 11/29/20  9:38 AM  Result Value Ref Range   Hep C Virus Ab 0.2 0.0 - 0.9 s/co ratio    Comment:                                   Negative:     < 0.8                              Indeterminate: 0.8 - 0.9                                   Positive:     > 0.9  The CDC recommends that a positive HCV  antibody result  be followed up with a HCV Nucleic Acid Amplification  test (161096(550713).   Iron and TIBC     Status: Abnormal   Collection Time: 01/31/21  8:23 AM  Result Value Ref Range   Iron 67 28 - 170 ug/dL   TIBC 045511 (H) 409250 - 811450 ug/dL   Saturation Ratios 13 10.4 - 31.8 %   UIBC 444 ug/dL    Comment: Performed at [facility], [address] [place], [place] [address]  Ferritin     Status: None   Collection Time: 01/31/21  8:23 AM  Result Value Ref Range   Ferritin 16 11 - 307 ng/mL    Comment: Performed at [facility], [address] [place], [place] [address]  Testosterone     Status: Abnormal   Collection Time: 01/31/21  8:23 AM  Result Value Ref Range   Testosterone <3 (L) 8 - 60 ng/dL    Comment: (NOTE) Performed At: [facility] [place] [address] [place], [place] [id number] Jolene SchimkeNagendra Sanjai MD ON:[id number]:[phone]   TSH     Status: None   Collection Time: 01/31/21  8:23 AM  Result Value Ref Range   TSH 2.875 0.350 - 4.500 uIU/mL    Comment: Performed by a 3rd Generation assay with a functional sensitivity of <=0.01 uIU/mL. Performed at [facility], [address] [place], [place] [address]   DHEA-sulfate     Status: None   Collection Time: 01/31/21  8:23 AM  Result Value Ref Range   DHEA-SO4 83.6 57.3 - 279.2 ug/dL    Comment: (NOTE) Performed At: [facility] [place] [address] [place], [place] [id number] Jolene SchimkeNagendra Sanjai MD IH:[id number]:[phone]   Miscellaneous [facility] test (send-out)     Status: None   Collection Time: 01/31/21  8:23 AM  Result Value Ref Range   [facility] test code [id number]    [facility] test name SAR COV2     Comment: Performed at [facility], [address] [place], [place] [address]   Misc [facility] result COMMENT     Comment: (NOTE) Test Ordered: [id number] SARS-CoV-2 Semi-Quant Total Ab SARS-CoV-2 Semi-Quant Total Ab 45.1             U/mL     BN  Reference Range: Negative<0.8                           Antibodies against the SARS-CoV-2 spike protein receptor binding domain (RBD) were detected. It is yet undetermined what level of antibody to SARS-CoV-2 spike protein correlates to immunity against developing symptomatic SARS-CoV-2 disease. Studies are underway to measure the quantitative levels of specific SARS-CoV-2 antibodies following vaccination. Such studies will provide valuable insights into the correlation between protection from vaccination and antibody levels. This test has not been FDA cleared or approved. This test has been authorized by FDA under an Emergency Use Authorization (EUA). This test is only authorized for the duration of the declaration that circumstances exist justifying the authorization of emergency use of in vitro diagnostics for detection and/or diagnosis of COVID- 19 under Section 564(b)(1) of the Act, 21 U.S.C. 161WRU-0(A)(5), unless the authorization is terminated or revoked sooner. This test has been authorized only for detecting the presence of antibodies against SARS-CoV-2, not for any other viruses or pathogens. SARS-CoV-2 Spike Ab Interp     Positive                  BN   This test has not been FDA cleared or approved. This test has been authorized by FDA under an Emergency Use Authorization (EUA). This test is only authorized for the duration of the declaration that circumstances exist justifying the authorization of emergency use of in vitro diagnostics for detection and/or diagnosis of COVID-19 under Section 564(b)(1) of the Act, 21 U.S.C. 409WJX-9(J)(4), unless the authorization is terminated or revoked sooner. This test has been authorized only for detecting the presence of antibodies against SARS-CoV-2, not for any other viruses or pathogens. Roche Elecsys Anti-SARS-CoV-2 S Performed At: [facility] [address] [place], [place] [address] Jolene Schimke MD YQ:[id number]    Objective  Body mass index is 24.43 kg/m. Wt  Readings from Last 3 Encounters:  02/14/21 133 lb 9.6 oz (60.6 kg)  01/30/21 127 lb (57.6 kg)  11/29/20 126 lb 3 oz (57.2 kg)   Temp Readings from Last 3 Encounters:  02/14/21 98.4 F (36.9 C) (Oral)  07/13/20 (!) 97.4 F (36.3 C) (Oral)  07/17/19 98.7 F (37.1 C) (Oral)   BP Readings from Last 3 Encounters:  02/14/21 122/80  11/29/20 116/69  10/17/20 109/80   Pulse Readings from Last 3 Encounters:  02/14/21 90  11/29/20 68  10/17/20 94    Physical Exam Vitals and nursing note reviewed.  Constitutional:      Appearance: Normal appearance. She is well-developed and well-groomed.  HENT:     Head: Normocephalic and atraumatic.  Eyes:     Conjunctiva/sclera: Conjunctivae normal.     Pupils: Pupils are equal, round, and reactive to light.  Cardiovascular:     Rate and Rhythm: Normal rate and regular rhythm.     Heart sounds: Normal heart sounds. No murmur heard.   Pulmonary:     Effort: Pulmonary effort is normal.     Breath sounds: Normal breath sounds.  Musculoskeletal:     Comments: +muscle spasm L>R   Skin:    General: Skin is warm and dry.  Neurological:     General: No focal deficit present.     Mental Status: She is alert and oriented to person, place, and time. Mental status is at baseline.     Gait: Gait normal.  Psychiatric:        Attention  and Perception: Attention and perception normal.        Mood and Affect: Mood and affect normal.        Speech: Speech normal.        Behavior: Behavior normal. Behavior is cooperative.        Thought Content: Thought content normal.        Cognition and Memory: Cognition and memory normal.        Judgment: Judgment normal.     Assessment  Plan  Cervicalgia radiating to b/l trapezius b/l L>R with muscle spasm - Plan: tiZANidine (ZANAFLEX) 2 MG capsule, Ambulatory referral to Physical Therapy, DG Cervical Spine Complete Wes [phone]  [address] [place]  2-3 x per week x 2-3 months   Neck and shoulder pain worse on left turning head and trapezius left>right  Interested in dry needling  meloxicam (MOBIC) 15 MG tablet, CANCELED: DG Cervical Spine Complete  Acute pain of both shoulders - Plan: tiZANidine (ZANAFLEX) 2 MG capsule, Ambulatory referral to Physical Therapy, meloxicam (MOBIC) 15 MG tablet or prilosec 20 mg qd  Muscle spasm - Plan: tiZANidine (ZANAFLEX) 2 MG capsule, Ambulatory referral to Physical Therapy Trapezius muscle spasm - Plan: Ambulatory referral to Physical Therapy  Consider ortho Dr. Kirtland Bouchard or poggi in the future  Consider neck Xray in the future if not improved in 2 weeks   Provider: Dr. French Ana McLean-Scocuzza-Internal Medicine

## 2021-02-17 ENCOUNTER — Other Ambulatory Visit: Payer: Self-pay | Admitting: Dermatology

## 2021-02-24 ENCOUNTER — Ambulatory Visit: Payer: No Typology Code available for payment source | Admitting: Internal Medicine

## 2021-02-27 ENCOUNTER — Ambulatory Visit: Payer: No Typology Code available for payment source

## 2021-02-28 ENCOUNTER — Ambulatory Visit: Payer: No Typology Code available for payment source | Admitting: Physical Therapy

## 2021-03-01 ENCOUNTER — Ambulatory Visit: Payer: No Typology Code available for payment source

## 2021-03-02 ENCOUNTER — Ambulatory Visit: Payer: No Typology Code available for payment source | Attending: Internal Medicine

## 2021-03-02 ENCOUNTER — Other Ambulatory Visit: Payer: Self-pay

## 2021-03-02 DIAGNOSIS — M542 Cervicalgia: Secondary | ICD-10-CM | POA: Insufficient documentation

## 2021-03-02 DIAGNOSIS — M533 Sacrococcygeal disorders, not elsewhere classified: Secondary | ICD-10-CM | POA: Insufficient documentation

## 2021-03-02 DIAGNOSIS — R293 Abnormal posture: Secondary | ICD-10-CM | POA: Insufficient documentation

## 2021-03-02 DIAGNOSIS — M62838 Other muscle spasm: Secondary | ICD-10-CM | POA: Diagnosis present

## 2021-03-02 NOTE — Therapy (Signed)
[facility] [address] [place], [address] Phone: [phone]   Fax:  [phone]  Physical Therapy Evaluation  Patient Details  Name: Brandi Johnson MRN: [id number] Date of Birth: 12/24/1986 Referring Provider (PT): McLean-Scocuzza MD   Encounter Date: 03/02/2021   PT End of Session - 03/02/21 0952    Visit Number 1    Number of Visits 17    Date for PT Re-Evaluation 04/27/21    PT Start Time 0815    PT Stop Time 0900    PT Time Calculation (min) 45 min    Activity Tolerance Patient tolerated treatment well    Behavior During Therapy [facility] for tasks assessed/performed           Past Medical History:  Diagnosis Date  . Acute bronchitis due to COVID-19 virus 10/17/2020  . AR (allergic rhinitis)   . Hemorrhoids in pregnancy   . History of renal calculi    with hydroneprosis noted during pregnancy  . Hydronephrosis   . Motion sickness   . UTI (lower urinary tract infection)     Past Surgical History:  Procedure Laterality Date  . CESAREAN SECTION  2012    There were no vitals filed for this visit.    Subjective Assessment - 03/02/21 0920    Subjective Patient is 35 yo female that reports c/c Pain in bilateral upper trap and into neck. 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 Pain is 4/10 currently, 6/10 at worst, only time it get below 4/10 is waking up in the morning. The pain gets worse as day goes along, turning head to the L and reaching up overhead makes it workse. L side is worse than R. Pain feels achey and sore but does not radiate down the arm. Heating pad makes the pain better. Also takes meloxicam which improves pain. Patient wants to be able to work without an increase in pain.    Limitations House hold activities;Lifting;Other (comment)   Reaching overehead   How long can you sit comfortably? unlimited    How  long can you stand comfortably? unlimited    How long can you walk comfortably? unlimited    Patient Stated Goals Work without pain    Currently in Pain? Yes    Pain Score 4     Pain Location Neck    Pain Orientation Right;Left    Pain Descriptors / Indicators Aching;Constant;Sore    Pain Type Chronic pain    Pain Onset More than a month ago    Pain Frequency Constant    Aggravating Factors  Lifiting weights, repetative overhead motions, turning head to L    Pain Relieving Factors Meloxicam/heating pad           Posture- Rounded shoulder, forward head posture  Gait- WNL  ROM-  Cervical Flexion- WNL- feels a stretch at end range Cervical Extension- WNL/painless Cervical Lateral Flexion- Limited bilateral/pain when going to L Cervical Rotation- WNL/pain when going to L Shoulder- All shoulder movement are full/painless  Strength Performed gym exercises to assess strength Forward raise with 8# recruited increased UT activation All other tests WNL  Palpation TTP along UT B Grade IV UPA C2-C5 hypomobile and painful -slightly more Hypomobile and painful on L  Tests Deep neck flexor testDrue Second    [facility] PT Assessment - 03/02/21 0001      Assessment   Medical  Diagnosis neck pain    Referring Provider (PT) McLean-Scocuzza MD    Onset Date/Surgical Date 01/31/21    Hand Dominance Right    Next MD Visit unknown    Prior Therapy yes - pelvic health      Balance Screen   Has the patient fallen in the past 6 months No    Has the patient had a decrease in activity level because of a fear of falling?  Yes    Is the patient reluctant to leave their home because of a fear of falling?  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    Not Billed: Dry Needling performed to Upper Trap B to decrease increased spasms and pain. Patient educated on risks and benefits of treatment and verbally consents to treatment.  (2) .84mm x 19mm to the UT with patient positioned in supine. Multiple twitches noted B          PT Education - 03/02/21 0927    Education Details HEP/ diagnosis and prognosis    Person(s) Educated Patient    Methods Explanation;Demonstration    Comprehension Verbalized understanding;Returned demonstration            PT Short Term Goals - 03/02/21 0954      PT SHORT TERM GOAL #1   Title Patient will show independece with HEP to further improve function and reduce pain    Baseline 03/02/21- Currently dependent    Time 3    Period Weeks    Status New    Target Date 03/23/21             PT Long Term Goals - 03/02/21 1008      PT LONG TERM GOAL #1   Title Patient will report no more than 1/10 pain at rest and 3/10 pain with activity to show improved functional ability and reduced pain    Baseline 03/02/21- Currently 4/10 at rest, 6/10 pain with activity    Time 6    Period Weeks    Status New    Target Date 04/13/21      PT LONG TERM GOAL #2   Title Patient will imrpove performance on deep neck flexor endurance to 60s to show improved strength    Baseline 03/02/21- 15sec    Time 6    Period Weeks    Status New    Target Date 04/13/21      PT LONG TERM GOAL #3   Title Patient will improve FOTO score to (INSERT FOTO SCORE) to show improved functional ability of the neck    Baseline 03/02/21- (INSERT BASELINE)    Time 6    Period Weeks    Status New    Target Date 04/13/21                  Plan - 03/02/21 0933    Clinical Impression Statement Patient is 35 y.o female with c/c of neck and shoulder pain. Patient shows limitations in the cervical region. The patient was TTP along the upper trap with trigger points noted. Patient also had pain with  cervical rotation and lateral flexion to  the left side, and had hypomobility from C2-C5. The patient had worse symptoms on the L side. These limitations have made it so the patient is unable to work without pain and exercise without pain. Patient will benefit from further skilled tehrapy to return to prior level of function.    Examination-Activity Limitations Carry;Lift;Reach Overhead    Examination-Participation Restrictions Occupation;Cleaning;Yard Work    Stability/Clinical Decision Making Stable/Uncomplicated    Optometrist Low    Rehab Potential Excellent    PT Frequency 2x / week    PT Duration 8 weeks    PT Treatment/Interventions ADLs/Self Care Home Management;Cryotherapy;Electrical Stimulation;Iontophoresis 4mg /ml Dexamethasone;Moist Heat;Traction;Therapeutic activities;Therapeutic exercise;Dry needling;Passive range of motion;Manual techniques;Joint Manipulations;Spinal Manipulations    PT Next Visit Plan UE strengthening exercises, Continue Dry needling    PT Home Exercise Plan Self Snag, Scapular Retractions, Cervical Retractions    Consulted and Agree with Plan of Care Patient           Patient will benefit from skilled therapeutic intervention in order to improve the following deficits and impairments:  Decreased activity tolerance,Decreased strength,Decreased range of motion,Hypomobility,Pain,Increased muscle spasms,Impaired flexibility,Decreased endurance  Visit Diagnosis: Cervicalgia  Other muscle spasm     Problem List Patient Active Problem List   Diagnosis Date Noted  . 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 Personal history of COVID-19 01/30/2021  . Advice given about COVID-19 virus infection 08/16/2020  . SI (sacroiliac) joint inflammation (HCC) 07/15/2020  . Upper respiratory infection 04/02/2020  . Allergic rhinitis 01/09/2020  . Weak urinary stream 01/05/2019  . Attention deficit disorder (ADD) 10/25/2015  . Family history of hemochromatosis  05/25/2013  . History of renal calculi     05/27/2013 03/02/2021, 4:14 PM 05/02/2021 SPT  [facility] [address], [address], [place] Phone: [phone]   Fax:  [phone]  Name: Brandi Johnson MRN: [id number] Date of Birth: 14-Sep-1986

## 2021-03-06 ENCOUNTER — Encounter: Payer: No Typology Code available for payment source | Admitting: Physical Therapy

## 2021-03-07 ENCOUNTER — Other Ambulatory Visit: Payer: Self-pay

## 2021-03-07 ENCOUNTER — Ambulatory Visit: Payer: No Typology Code available for payment source

## 2021-03-07 DIAGNOSIS — M542 Cervicalgia: Secondary | ICD-10-CM | POA: Diagnosis not present

## 2021-03-07 DIAGNOSIS — M62838 Other muscle spasm: Secondary | ICD-10-CM

## 2021-03-07 DIAGNOSIS — R293 Abnormal posture: Secondary | ICD-10-CM

## 2021-03-07 NOTE — Therapy (Signed)
[facility] MAIN [facility] SERVICES [address] [place], [place], [address] Phone: [phone]   Fax:  [phone]  Physical Therapy Treatment  Patient Details  Name: Brandi Johnson MRN: [id number] Date of Birth: 11/18/1986 Referring Provider (PT): McLean-Scocuzza MD   Encounter Date: 03/07/2021   PT End of Session - 03/07/21 1244    Visit Number 2    Number of Visits 17    Date for PT Re-Evaluation 04/27/21    PT Start Time 0715    PT Stop Time 0758    PT Time Calculation (min) 43 min    Activity Tolerance Patient tolerated treatment well    Behavior During Therapy [facility] for tasks assessed/performed           Past Medical History:  Diagnosis Date  . Acute bronchitis due to COVID-19 virus 10/17/2020  . AR (allergic rhinitis)   . Hemorrhoids in pregnancy   . History of renal calculi    with hydroneprosis noted during pregnancy  . Hydronephrosis   . Motion sickness   . UTI (lower urinary tract infection)     Past Surgical History:  Procedure Laterality Date  . CESAREAN SECTION  2012    There were no vitals filed for this visit.   Subjective Assessment - 03/07/21 1244    Subjective Patient reports she was aching after last dry needling session but has improved her pain levels.    Limitations House hold activities;Lifting;Other (comment)   Reaching overehead   How long can you sit comfortably? unlimited    How long can you stand comfortably? unlimited    How long can you walk comfortably? unlimited    Patient Stated Goals Work without pain    Currently in Pain? Yes    Pain Score 3     Pain Location Neck    Pain Orientation Right;Left    Pain Descriptors / Indicators Aching    Pain Type Chronic pain    Pain Onset More than a month ago    Pain Frequency Constant              manual: Cervical side bend  With overpressure to occiput and glenohumeral joint 2x 30 seconds each side Cervical rotation With overpressure to  occiput and glenohumeral joint 2x 30 seconds each side Sub occipital release 2x30 seocnd holds First rib inferior mobilizations grade II, 3x10 seconds STM to suboccipital, cervical paraspinals, and upper trap with implementation of effleurage and pettrrissage x4 minutes Grade II UPA and CPA cervical and thoracic spine 2x 5 seconds each level Grade II J mobilization to upper thoracic spine for postural alignment 2x 15 second pulsing.   Not Billed: Dry Needling performed to Upper Trap B to decrease increased spasms and pain. Patient educated on risks and benefits of treatment and verbally consents to treatment.   (2) .18mm x 30mm to the UT with patient positioned in prone. Multiple twitches noted B. .25 mm x40 mm to L cervical paraspinal .     TherEx: Wall posture intervention for posture 30 seconds x 2 French pectoral stretch against wall 30 seconds  Seated french pectoral stretch 30 seconds Supine robber stretch 30 seconds (added to HEP)   Pt educated throughout session about proper posture and technique with exercises. Improved exercise technique, movement at target joints, use of target muscles after min to mod verbal, visual, tactile cues    Access Code: [email or web address] URL: [email or web address] Date: 03/07/2021 Prepared by: Precious Bard  Exercises Standing Thoracic Extension  at Wall - 1 x daily - 7 x weekly - 2 sets - 3 reps - 15 hold Correct Standing Posture - 1 x daily - 7 x weekly - 2 sets - 1 reps - 30 hold Seated Thoracic Lumbar Extension with Pectoralis Stretch - 1 x daily - 7 x weekly - 2 sets - 3 reps - 10 hold               PT Education - 03/07/21 1244    Education Details exercise technique, HEP, manual    Person(s) Educated Patient    Methods Explanation;Demonstration;Tactile cues;Verbal cues    Comprehension Verbalized understanding;Returned demonstration;Verbal cues required;Tactile cues required            PT Short Term Goals - 03/02/21  0954      PT SHORT TERM GOAL #1   Title Patient will show independece with HEP to further improve function and reduce pain    Baseline 03/02/21- Currently dependent    Time 3    Period Weeks    Status New    Target Date 03/23/21             PT Long Term Goals - 03/02/21 1008      PT LONG TERM GOAL #1   Title Patient will report no more than 1/10 pain at rest and 3/10 pain with activity to show improved functional ability and reduced pain    Baseline 03/02/21- Currently 4/10 at rest, 6/10 pain with activity    Time 6    Period Weeks    Status New    Target Date 04/13/21      PT LONG TERM GOAL #2   Title Patient will imrpove performance on deep neck flexor endurance to 60s to show improved strength    Baseline 03/02/21- 15sec    Time 6    Period Weeks    Status New    Target Date 04/13/21      PT LONG TERM GOAL #3   Title Patient will improve FOTO score to (INSERT FOTO SCORE) to show improved functional ability of the neck    Baseline 03/02/21- (INSERT BASELINE)    Time 6    Period Weeks    Status New    Target Date 04/13/21                 Plan - 03/07/21 1245    Clinical Impression Statement Patient presents with multiple trigger points and muscle tissue dysfunction of upper traps and cervical musculature. Patient tolerated manual and therex intervention well with no increase of pain. HEP given with patient demonstrating understanding. Patient will benefit from skilled physical therapy to reduce pain, improve mobility, and return to PLOF.    Examination-Activity Limitations Carry;Lift;Reach Overhead    Examination-Participation Restrictions Occupation;Cleaning;Yard Work    Stability/Clinical Decision Making Stable/Uncomplicated    Rehab Potential Excellent    PT Frequency 2x / week    PT Duration 8 weeks    PT Treatment/Interventions ADLs/Self Care Home Management;Cryotherapy;Electrical Stimulation;Iontophoresis 4mg /ml Dexamethasone;Moist Heat;Traction;Therapeutic  activities;Therapeutic exercise;Dry needling;Passive range of motion;Manual techniques;Joint Manipulations;Spinal Manipulations    PT Next Visit Plan UE strengthening exercises, Continue Dry needling    PT Home Exercise Plan Self Snag, Scapular Retractions, Cervical Retractions    Consulted and Agree with Plan of Care Patient           Patient will benefit from skilled therapeutic intervention in order to improve the following deficits and impairments:  Decreased activity tolerance,Decreased strength,Decreased range  of motion,Hypomobility,Pain,Increased muscle spasms,Impaired flexibility,Decreased endurance  Visit Diagnosis: Cervicalgia  Other muscle spasm  Abnormal posture     Problem List Patient Active Problem List   Diagnosis Date Noted  . 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 Personal history of COVID-19 01/30/2021  . Advice given about COVID-19 virus infection 08/16/2020  . SI (sacroiliac) joint inflammation (HCC) 07/15/2020  . Upper respiratory infection 04/02/2020  . Allergic rhinitis 01/09/2020  . Weak urinary stream 01/05/2019  . Attention deficit disorder (ADD) 10/25/2015  . Family history of hemochromatosis 05/25/2013  . History of renal calculi    Precious Bard, PT, DPT   03/07/2021, 12:46 PM  [facility] MAIN [facility] SERVICES [address] [place], [place], [address] Phone: [phone]   Fax:  [phone]  Name: Brandi Johnson MRN: [id number] Date of Birth: 06/17/1986

## 2021-03-08 ENCOUNTER — Encounter: Payer: No Typology Code available for payment source | Admitting: Physical Therapy

## 2021-03-08 ENCOUNTER — Ambulatory Visit: Payer: No Typology Code available for payment source | Admitting: Internal Medicine

## 2021-03-13 ENCOUNTER — Encounter: Payer: No Typology Code available for payment source | Admitting: Physical Therapy

## 2021-03-14 ENCOUNTER — Ambulatory Visit: Payer: No Typology Code available for payment source

## 2021-03-15 ENCOUNTER — Encounter: Payer: No Typology Code available for payment source | Admitting: Physical Therapy

## 2021-03-16 ENCOUNTER — Ambulatory Visit: Payer: No Typology Code available for payment source

## 2021-03-20 ENCOUNTER — Encounter: Payer: No Typology Code available for payment source | Admitting: Physical Therapy

## 2021-03-21 ENCOUNTER — Other Ambulatory Visit: Payer: Self-pay

## 2021-03-21 ENCOUNTER — Ambulatory Visit: Payer: No Typology Code available for payment source

## 2021-03-21 DIAGNOSIS — M533 Sacrococcygeal disorders, not elsewhere classified: Secondary | ICD-10-CM

## 2021-03-21 DIAGNOSIS — R293 Abnormal posture: Secondary | ICD-10-CM

## 2021-03-21 DIAGNOSIS — M62838 Other muscle spasm: Secondary | ICD-10-CM

## 2021-03-21 DIAGNOSIS — M542 Cervicalgia: Secondary | ICD-10-CM

## 2021-03-21 NOTE — Therapy (Signed)
[facility] MAIN [facility] SERVICES [address] [place], [place], [address] Phone: [phone]   Fax:  [phone]  Physical Therapy Treatment  Patient Details  Name: Brandi Johnson MRN: [id number] Date of Birth: 07-31-1986 Referring Provider (PT): McLean-Scocuzza MD   Encounter Date: 03/21/2021   PT End of Session - 03/21/21 1024    Visit Number 3    Number of Visits 17    Date for PT Re-Evaluation 04/27/21    Authorization Type 03/02/21-05/24/21    PT Start Time 0803    PT Stop Time 0843    PT Time Calculation (min) 40 min    Activity Tolerance Patient tolerated treatment well;No increased pain    Behavior During Therapy WFL for tasks assessed/performed           Past Medical History:  Diagnosis Date  . Acute bronchitis due to COVID-19 virus 10/17/2020  . AR (allergic rhinitis)   . Hemorrhoids in pregnancy   . History of renal calculi    with hydroneprosis noted during pregnancy  . Hydronephrosis   . Motion sickness   . UTI (lower urinary tract infection)     Past Surgical History:  Procedure Laterality Date  . CESAREAN SECTION  2012    There were no vitals filed for this visit.   Subjective Assessment - 03/21/21 1023    Subjective Pt doing well since prior visit two weeks ago, has been working on her HEP. Pain remains somewhat improved. Pt missed appointments last week due to therapist being out sick.    Currently in Pain? Yes    Pain Score 2              Trigger Point Dry Needling - 03/21/21 0001    Consent Given? Yes    Education Handout Provided No    Muscles Treated Head and Neck Upper trapezius    Upper Trapezius Response Twitch reponse elicited;Palpable increased muscle length   no response to 0.28mm, moved to 0.60mm; twtiches at posterior and anterior bandf of mid clavicular UT         INTERVENTION THIS DATE:  *Pt in prone for dry needling, followed by sustained ischaemic release of tissue (total time of  actual needling <2 minutes)   -In Supine, ART of Left UT paired with Rt cervical lateral flexion x20, then Rt cervical rotation x20 (both P/ROM)  -educated on seated self myofasical release work of Left UT using Right hand for grip, and active neck ROM -Supine DNF 5x10sec, reviewed for HEP (handout issued)  -Blue TB row, multimodal cues for retraction 1x15 (handout issued)  -Black TB row, multimodal cues for retraction 1x15   PT Short Term Goals - 03/02/21 0954      PT SHORT TERM GOAL #1   Title Patient will show independece with HEP to further improve function and reduce pain    Baseline 03/02/21- Currently dependent    Time 3    Period Weeks    Status New    Target Date 03/23/21             PT Long Term Goals - 03/02/21 1008      PT LONG TERM GOAL #1   Title Patient will report no more than 1/10 pain at rest and 3/10 pain with activity to show improved functional ability and reduced pain    Baseline 03/02/21- Currently 4/10 at rest, 6/10 pain with activity    Time 6    Period Weeks  Status New    Target Date 04/13/21      PT LONG TERM GOAL #2   Title Patient will imrpove performance on deep neck flexor endurance to 60s to show improved strength    Baseline 03/02/21- 15sec    Time 6    Period Weeks    Status New    Target Date 04/13/21      PT LONG TERM GOAL #3   Title Patient will improve FOTO score to (INSERT FOTO SCORE) to show improved functional ability of the neck    Baseline 03/02/21- (INSERT BASELINE)    Time 6    Period Weeks    Status New    Target Date 04/13/21                 Plan - 03/21/21 1026    Clinical Impression Statement Continued with current plan of care as laid out in evaluation and recent prior sessions. Pt remains motivated to advance progress toward goals. Rest breaks provided as needed, pt quick to ask when needed. Author maintains all interventions within appropriate level of intensity as not to purposefully exacerbate pain. Pt does  require varying levels of assistance and cuing for completion of exercises for correct form and sometimes due to pain/weakness. Pt continues to demonstrate progress toward goals AEB progression of some interventions this date either in volume or intensity. No updates to HEP this date.    Examination-Activity Limitations Carry;Lift;Reach Overhead    Examination-Participation Restrictions Occupation;Cleaning;Yard Work    Stability/Clinical Decision Making Stable/Uncomplicated    Optometrist Low    Rehab Potential Excellent    PT Frequency 2x / week    PT Duration 8 weeks    PT Treatment/Interventions ADLs/Self Care Home Management;Cryotherapy;Electrical Stimulation;Iontophoresis 4mg /ml Dexamethasone;Moist Heat;Traction;Therapeutic activities;Therapeutic exercise;Dry needling;Passive range of motion;Manual techniques;Joint Manipulations;Spinal Manipulations    PT Next Visit Plan MFR to Left Upper Trap as needed (TPDN prn), deep neck flexor endurance training    PT Home Exercise Plan Tband row with end-range scap retraction; supine isometric DNF holds (10secH)    Consulted and Agree with Plan of Care Patient           Patient will benefit from skilled therapeutic intervention in order to improve the following deficits and impairments:  Decreased activity tolerance,Decreased strength,Decreased range of motion,Hypomobility,Pain,Increased muscle spasms,Impaired flexibility,Decreased endurance  Visit Diagnosis: Cervicalgia  Other muscle spasm  Abnormal posture  Sacrococcygeal disorders, not elsewhere classified     Problem List Patient Active Problem List   Diagnosis Date Noted  . 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 Personal history of COVID-19 01/30/2021  . Advice given about COVID-19 virus infection 08/16/2020  . SI (sacroiliac) joint inflammation (HCC) 07/15/2020  . Upper respiratory infection 04/02/2020  . Allergic rhinitis 01/09/2020  . Weak urinary stream 01/05/2019  .  Attention deficit disorder (ADD) 10/25/2015  . Family history of hemochromatosis 05/25/2013  . History of renal calculi    10:37 AM, 03/21/21 03/23/21, PT, DPT Physical Therapist - [facility]  Outpatient Physical Therapy- Main Campus [phone]     [phone] 03/21/2021, 10:32 AM  [facility] MAIN [facility] SERVICES [address] [place], [place], [place] Phone: [phone]   Fax:  [phone]  Name: Brandi Johnson MRN: Anell [id number] Date of Birth: 01/15/1986

## 2021-03-22 ENCOUNTER — Encounter: Payer: No Typology Code available for payment source | Admitting: Physical Therapy

## 2021-03-27 ENCOUNTER — Encounter: Payer: No Typology Code available for payment source | Admitting: Physical Therapy

## 2021-03-29 ENCOUNTER — Encounter: Payer: No Typology Code available for payment source | Admitting: Physical Therapy

## 2021-04-03 ENCOUNTER — Ambulatory Visit: Payer: No Typology Code available for payment source | Attending: Internal Medicine

## 2021-04-03 ENCOUNTER — Other Ambulatory Visit: Payer: Self-pay

## 2021-04-03 DIAGNOSIS — R293 Abnormal posture: Secondary | ICD-10-CM

## 2021-04-03 DIAGNOSIS — M62838 Other muscle spasm: Secondary | ICD-10-CM

## 2021-04-03 DIAGNOSIS — M542 Cervicalgia: Secondary | ICD-10-CM | POA: Diagnosis present

## 2021-04-03 NOTE — Therapy (Signed)
[facility] MAIN [facility] SERVICES [address] [place], [place], [address] Phone: [phone]   Fax:  [phone]  Physical Therapy Treatment  Patient Details  Name: Brandi Johnson MRN: [id number] Date of Birth: 06/21/1986 Referring Provider (PT): McLean-Scocuzza MD   Encounter Date: 04/03/2021   PT End of Session - 04/03/21 0815    Visit Number 4    Number of Visits 17    Date for PT Re-Evaluation 04/27/21    Authorization Type 03/02/21-05/24/21    PT Start Time 0714    PT Stop Time 0756    PT Time Calculation (min) 42 min    Activity Tolerance Patient tolerated treatment well;No increased pain    Behavior During Therapy WFL for tasks assessed/performed           Past Medical History:  Diagnosis Date  . Acute bronchitis due to COVID-19 virus 10/17/2020  . AR (allergic rhinitis)   . Hemorrhoids in pregnancy   . History of renal calculi    with hydroneprosis noted during pregnancy  . Hydronephrosis   . Motion sickness   . UTI (lower urinary tract infection)     Past Surgical History:  Procedure Laterality Date  . CESAREAN SECTION  2012    There were no vitals filed for this visit.   Subjective Assessment - 04/03/21 0750    Subjective Patient reports she is doing better. Not yet where she wants to be. Has been compliant with HEP.    Limitations House hold activities;Lifting;Other (comment)    How long can you sit comfortably? unlimited    How long can you walk comfortably? unlimited    Patient Stated Goals Work without pain    Currently in Pain? Yes    Pain Score 2     Pain Location Neck    Pain Orientation Right;Left    Pain Descriptors / Indicators Aching    Pain Type Chronic pain    Pain Onset More than a month ago    Pain Frequency Constant             manual: Cervical side bend  With overpressure to occiput and glenohumeral joint 2x 30 seconds each side Cervical rotation With overpressure to occiput and  glenohumeral joint 2x 30 seconds each side Sub occipital release 2x30 seocnd holds First rib inferior mobilizations grade II, 3x10 seconds STM to suboccipital, cervical paraspinals, and upper trap with implementation of effleurage and pettrrissage x4 minutes Grade II UPA and CPA cervical and thoracic spine 2x 5 seconds each level Grade II J mobilization to upper thoracic spine for postural alignment 2x 15 second pulsing.   Not Billed: Dry Needling performed to Upper Trap B to decrease increased spasms and pain. Patient educated on risks and benefits of treatment and verbally consents to treatment. (2) .56mm x 33mm to the UT, L cervical paraspinal, and levator scap with patient positioned in prone. Multiple twitches noted B. .25 mm x40 mm .      TherEx: Scapular retraction with chin tuck 10x 5 second hold supine Half foam roller pec stretch 30 seconds Seated french pectoral stretch 30 seconds BTB row with chin tuck 10x    Pt educated throughout session about proper posture and technique with exercises. Improved exercise technique, movement at target joints, use of target muscles after min to mod verbal, visual, tactile cues    Patient reports reduced pain and stiffness in neck by end of session. Postural interventions performed after manual with patient demonstrating  understanding and tolerance. She is tolerating progressive interventions well. Patient will benefit from skilled physical therapy to reduce pain, improve mobility, and return to PLOF.                     PT Education - 04/03/21 0805    Education Details exercise technique,    Person(s) Educated Patient    Methods Explanation;Demonstration;Tactile cues;Verbal cues    Comprehension Verbalized understanding;Returned demonstration;Verbal cues required;Tactile cues required            PT Short Term Goals - 03/02/21 0954      PT SHORT TERM GOAL #1   Title Patient will show independece with HEP to further  improve function and reduce pain    Baseline 03/02/21- Currently dependent    Time 3    Period Weeks    Status New    Target Date 03/23/21             PT Long Term Goals - 03/02/21 1008      PT LONG TERM GOAL #1   Title Patient will report no more than 1/10 pain at rest and 3/10 pain with activity to show improved functional ability and reduced pain    Baseline 03/02/21- Currently 4/10 at rest, 6/10 pain with activity    Time 6    Period Weeks    Status New    Target Date 04/13/21      PT LONG TERM GOAL #2   Title Patient will imrpove performance on deep neck flexor endurance to 60s to show improved strength    Baseline 03/02/21- 15sec    Time 6    Period Weeks    Status New    Target Date 04/13/21      PT LONG TERM GOAL #3   Title Patient will improve FOTO score to (INSERT FOTO SCORE) to show improved functional ability of the neck    Baseline 03/02/21- (INSERT BASELINE)    Time 6    Period Weeks    Status New    Target Date 04/13/21                 Plan - 04/03/21 0827    Clinical Impression Statement Patient reports reduced pain and stiffness in neck by end of session. Postural interventions performed after manual with patient demonstrating understanding and tolerance. She is tolerating progressive interventions well. Patient will benefit from skilled physical therapy to reduce pain, improve mobility, and return to PLOF.    Examination-Activity Limitations Carry;Lift;Reach Overhead    Examination-Participation Restrictions Occupation;Cleaning;Yard Work    Stability/Clinical Decision Making Stable/Uncomplicated    Rehab Potential Excellent    PT Frequency 2x / week    PT Duration 8 weeks    PT Treatment/Interventions ADLs/Self Care Home Management;Cryotherapy;Electrical Stimulation;Iontophoresis 4mg /ml Dexamethasone;Moist Heat;Traction;Therapeutic activities;Therapeutic exercise;Dry needling;Passive range of motion;Manual techniques;Joint Manipulations;Spinal  Manipulations    PT Next Visit Plan UE strengthening exercises, Continue Dry needling    PT Home Exercise Plan Self Snag, Scapular Retractions, Cervical Retractions    Consulted and Agree with Plan of Care Patient           Patient will benefit from skilled therapeutic intervention in order to improve the following deficits and impairments:  Decreased activity tolerance,Decreased strength,Decreased range of motion,Hypomobility,Pain,Increased muscle spasms,Impaired flexibility,Decreased endurance  Visit Diagnosis: Cervicalgia  Other muscle spasm  Abnormal posture     Problem List Patient Active Problem List   Diagnosis Date Noted  . 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  Personal history of COVID-19 01/30/2021  . Advice given about COVID-19 virus infection 08/16/2020  . SI (sacroiliac) joint inflammation (HCC) 07/15/2020  . Upper respiratory infection 04/02/2020  . Allergic rhinitis 01/09/2020  . Weak urinary stream 01/05/2019  . Attention deficit disorder (ADD) 10/25/2015  . Family history of hemochromatosis 05/25/2013  . History of renal calculi    Precious Bard, PT, DPT   04/03/2021, 8:28 AM  [place] [facility] MAIN [facility] SERVICES [address] [place], [place], [address] Phone: [phone]   Fax:  [phone]  Name: Brandi Johnson MRN: [id number] Date of Birth: 08/05/1986

## 2021-04-06 ENCOUNTER — Ambulatory Visit: Payer: No Typology Code available for payment source

## 2021-04-11 ENCOUNTER — Ambulatory Visit: Payer: No Typology Code available for payment source

## 2021-04-13 ENCOUNTER — Ambulatory Visit: Payer: No Typology Code available for payment source

## 2021-04-18 ENCOUNTER — Other Ambulatory Visit: Payer: Self-pay

## 2021-04-18 ENCOUNTER — Ambulatory Visit: Payer: No Typology Code available for payment source

## 2021-04-18 DIAGNOSIS — M62838 Other muscle spasm: Secondary | ICD-10-CM

## 2021-04-18 DIAGNOSIS — M542 Cervicalgia: Secondary | ICD-10-CM | POA: Diagnosis not present

## 2021-04-18 DIAGNOSIS — R293 Abnormal posture: Secondary | ICD-10-CM

## 2021-04-18 NOTE — Therapy (Signed)
[place] MAIN [facility] SERVICES [address] [place], [place], [address] Phone: [phone]   Fax:  [phone]  Physical Therapy Treatment/ DISCHARGE  Patient Details  Name: Brandi Johnson MRN: [id number] Date of Birth: [birth year] Referring Provider (PT): McLean-Scocuzza MD   Encounter Date: 04/18/2021   PT End of Session - 04/18/21 0829    Visit Number 5    Number of Visits 17    Date for PT Re-Evaluation 04/27/21    Authorization Type 03/02/21-05/24/21    PT Start Time 0707    PT Stop Time 0754    PT Time Calculation (min) 47 min    Activity Tolerance Patient tolerated treatment well;No increased pain    Behavior During Therapy WFL for tasks assessed/performed           Past Medical History:  Diagnosis Date  . Acute bronchitis due to COVID-19 virus 10/17/2020  . AR (allergic rhinitis)   . Hemorrhoids in pregnancy   . History of renal calculi    with hydroneprosis noted during pregnancy  . Hydronephrosis   . Motion sickness   . UTI (lower urinary tract infection)     Past Surgical History:  Procedure Laterality Date  . CESAREAN SECTION  2012    There were no vitals filed for this visit.   Subjective Assessment - 04/18/21 0828    Subjective Patient reports she is back to her PLOF. Noticed her posture isn't 100% but is more aware of it now.    Limitations House hold activities;Lifting;Other (comment)    How long can you sit comfortably? unlimited    How long can you walk comfortably? unlimited    Patient Stated Goals Work without pain    Currently in Pain? No/denies             Goals:  VAS: 1/10  FOTO: 83% Deep neck flexor endurance: >60 seconds     manual: Cervical side bend  With overpressure to occiput and glenohumeral joint 2x 30 seconds each side Cervical rotation With overpressure to occiput and glenohumeral joint 2x 30 seconds each side Sub occipital release 2x30 second holds STM to suboccipital,  cervical paraspinals, and upper trap with implementation of effleurage and petrissage x4 minutes  Not Billed: Dry Needling performed to Upper Trap B to decrease increased spasms and pain. Patient educated on risks and benefits of treatment and verbally consents to treatment. (2) .35m x 42mto the UT, L cervical paraspinal, and levator scap with patient positioned in prone. Multiple twitches noted B. .25 mm x40 mm .      TherEx: Access Code: [email or web address]: [email or web address] Date: 04/18/2021 Prepared by: MaJanna ArchExercises Supine Cervical Flexion Extension on Pillow - 1 x daily - 7 x weekly - 2 sets - 10 reps - 5 hold Supine Cervical Retraction with Towel - 1 x daily - 7 x weekly - 2 sets - 10 reps - 5 hold Seated Upper Trapezius Stretch - 1 x daily - 7 x weekly - 2 sets - 2 reps - 30 hold Upper Trapezius Stretch - 1 x daily - 7 x weekly - 2 sets - 2 reps - 30 hold Cervical Extension AROM with Strap - 1 x daily - 7 x weekly - 2 sets - 10 reps - 5 hold Open Book Chest Stretch on Towel Roll - 1 x daily - 7 x weekly - 2 sets - 2 reps - 30 hold Seated Thoracic Lumbar Extension with Pectoralis Stretch -  1 x daily - 7 x weekly - 2 sets - 2 reps - 30 hold Single Arm Doorway Pec Stretch at 90 Degrees Abduction - 1 x daily - 7 x weekly - 2 sets - 2 reps - 30 hold   Pt educated throughout session about proper posture and technique with exercises. Improved exercise technique, movement at target joints, use of target muscles after min to mod verbal, visual, tactile cues     Patient has met goals at this time and is ready for discharge. Her pain is under control and she demonstrates understanding of self control to prevent and reduce flair ups. I will be happy to treat her again in the future as needed.              PT Education - 04/18/21 0829    Education Details goals, POC, discharge, HEP    Person(s) Educated Patient    Methods Explanation;Demonstration;Tactile  cues;Verbal cues;Handout    Comprehension Verbalized understanding;Returned demonstration;Verbal cues required;Tactile cues required            PT Short Term Goals - 04/18/21 0831      PT SHORT TERM GOAL #1   Title Patient will show independece with HEP to further improve function and reduce pain    Baseline 03/02/21- Currently dependent 4/19; HEP compliant    Time 3    Period Weeks    Status Achieved    Target Date 03/23/21             PT Long Term Goals - 04/18/21 0832      PT LONG TERM GOAL #1   Title Patient will report no more than 1/10 pain at rest and 3/10 pain with activity to show improved functional ability and reduced pain    Baseline 03/02/21- Currently 4/10 at rest, 6/10 pain with activity 4/19 ;1/10    Time 6    Period Weeks    Status Achieved      PT LONG TERM GOAL #2   Title Patient will imrpove performance on deep neck flexor endurance to 60s to show improved strength    Baseline 03/02/21- 15sec 4/19: > 60 seconds    Time 6    Period Weeks    Status Achieved      PT LONG TERM GOAL #3   Title Patient will improve FOTO score to (INSERT FOTO SCORE) to show improved functional ability of the neck    Baseline 03/02/21- (INSERT BASELINE) 4/19: 83%    Time 6    Period Weeks    Status Achieved                 Plan - 04/18/21 0831    Clinical Impression Statement Patient has met goals at this time and is ready for discharge. Her pain is under control and she demonstrates understanding of self control to prevent and reduce flair ups. I will be happy to treat her again in the future as needed.    Examination-Activity Limitations Carry;Lift;Reach Overhead    Examination-Participation Restrictions Occupation;Cleaning;Yard Work    Stability/Clinical Decision Making Stable/Uncomplicated    Rehab Potential Excellent    PT Frequency 2x / week    PT Duration 8 weeks    PT Treatment/Interventions ADLs/Self Care Home Management;Cryotherapy;Electrical  Stimulation;Iontophoresis 15m/ml Dexamethasone;Moist Heat;Traction;Therapeutic activities;Therapeutic exercise;Dry needling;Passive range of motion;Manual techniques;Joint Manipulations;Spinal Manipulations    PT Next Visit Plan UE strengthening exercises, Continue Dry needling    PT Home Exercise Plan Self Snag, Scapular Retractions, Cervical Retractions  Consulted and Agree with Plan of Care Patient           Patient will benefit from skilled therapeutic intervention in order to improve the following deficits and impairments:  Decreased activity tolerance,Decreased strength,Decreased range of motion,Hypomobility,Pain,Increased muscle spasms,Impaired flexibility,Decreased endurance  Visit Diagnosis: Cervicalgia  Other muscle spasm  Abnormal posture     Problem List Patient Active Problem List   Diagnosis Date Noted  . 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 Personal history of COVID-19 01/30/2021  . Advice given about COVID-19 virus infection 08/16/2020  . SI (sacroiliac) joint inflammation ([place]) 07/15/2020  . Upper respiratory infection 04/02/2020  . Allergic rhinitis 01/09/2020  . Weak urinary stream 01/05/2019  . Attention deficit disorder (ADD) 10/25/2015  . Family history of hemochromatosis 05/25/2013  . History of renal calculi    Janna Arch, PT, DPT   04/18/2021, 8:35 AM  [place] MAIN [facility] SERVICES [address] [place], [place], [address] Phone: [phone]   Fax:  [phone]  Name: CHASITI WADDINGTON MRN: [id number] Date of Birth: 06/14/1986

## 2021-04-20 ENCOUNTER — Ambulatory Visit: Payer: No Typology Code available for payment source

## 2021-04-25 ENCOUNTER — Ambulatory Visit: Payer: No Typology Code available for payment source

## 2021-04-27 ENCOUNTER — Ambulatory Visit: Payer: No Typology Code available for payment source

## 2021-05-08 ENCOUNTER — Other Ambulatory Visit: Payer: Self-pay

## 2021-05-08 MED FILL — Tizanidine HCl Cap 2 MG (Base Equivalent): ORAL | 30 days supply | Qty: 30 | Fill #0 | Status: AC

## 2021-05-12 ENCOUNTER — Ambulatory Visit: Payer: No Typology Code available for payment source | Admitting: Internal Medicine

## 2021-05-17 ENCOUNTER — Other Ambulatory Visit: Payer: Self-pay

## 2021-05-17 ENCOUNTER — Ambulatory Visit (INDEPENDENT_AMBULATORY_CARE_PROVIDER_SITE_OTHER): Payer: No Typology Code available for payment source | Admitting: Internal Medicine

## 2021-05-17 VITALS — BP 112/72 | HR 83 | Temp 98.1°F | Resp 16 | Ht 62.0 in | Wt 134.4 lb

## 2021-05-17 DIAGNOSIS — D509 Iron deficiency anemia, unspecified: Secondary | ICD-10-CM | POA: Diagnosis not present

## 2021-05-17 DIAGNOSIS — E611 Iron deficiency: Secondary | ICD-10-CM | POA: Diagnosis not present

## 2021-05-17 DIAGNOSIS — N644 Mastodynia: Secondary | ICD-10-CM

## 2021-05-17 LAB — POCT URINE PREGNANCY: Preg Test, Ur: NEGATIVE

## 2021-05-17 NOTE — Progress Notes (Signed)
Subjective:  Patient ID: Brandi Johnson, female    DOB: 25-Oct-1986  Age: 35 y.o. MRN: [id number]  CC: The primary encounter diagnosis was Iron deficiency anemia, unspecified iron deficiency anemia type. Diagnoses of Focal non-cyclical breast pain and Iron deficiency were also pertinent to this visit.  HPI Brandi Johnson presents for follow up on recent diagnosis of IDA by dermatologist .    This visit occurred during the SARS-CoV-2 public health emergency.  Safety protocols were in place, including screening questions prior to the visit, additional usage of staff PPE, and extensive cleaning of exam room while observing appropriate contact time as indicated for disinfecting solutions.   Seen in February for hair loss which began in December.  (after a COVID infection in October) .  Iron studies were borderline (TIBC was elevated but sats were normal).  Saw dermatologist who diagnosed iron deficiency and recommended twice daily iron supplements for 6 months with "goal ferritin of 50"  (per patient).  She has been taking iron once daily with food for 2 months, could not tolerate the dose ,  and for the last month taking it 5 days/week at most  Has had Some constipation treated with miralax.  Hair is growing back, still has altered sense of taste for sodas since COVID .  She has also increased her protein intake to 60 g  Shoulder pain  Left sided  Now resolved since seeing  PT   Breast pain: new onset,   For the last 2 weeks she has been having Focal pain in right breast at 9 :00 position.    No masses appreciated. No diffuse breast tenderness.  No nipple discharge.   Has an IUD for pregnancy protection     Outpatient Medications Prior to Visit  Medication Sig Dispense Refill  . azelastine (ASTELIN) 0.1 % nasal spray Place into both nostrils.    . Biotin 10 MG CAPS Take 10 mg by mouth daily.     . cetirizine (ZYRTEC) 10 MG tablet Take 1 tablet (10 mg total) daily by mouth. 90 tablet 4  .  Cholecalciferol (VITAMIN D3) 1.25 MG (50000 UT) TABS Take by mouth.    . ferrous sulfate 325 (65 FE) MG tablet TAKE 1 TABLET BY MOUTH TWICE DAILY WITH FOOD 60 tablet 3  . levonorgestrel (MIRENA) 20 MCG/24HR IUD 1 each by Intrauterine route once.    . meclizine (ANTIVERT) 25 MG tablet Take 25 mg by mouth daily as needed.    . meloxicam (MOBIC) 15 MG tablet TAKE 1 TABLET BY MOUTH DAILY AS NEEDED FOR PAIN. (Patient taking differently: Take by mouth daily as needed. for pain) 30 tablet 1  . Omega-3 Fatty Acids (FISH OIL) 1000 MG CAPS Take by mouth.    Marland Kitchen omeprazole (PRILOSEC) 20 MG capsule TAKE 1 CAPSULE BY MOUTH DAILY AS NEEDED. 30 capsule 2  . tizanidine (ZANAFLEX) 2 MG capsule TAKE 1 CAPSULE (2 MG TOTAL) BY MOUTH AT BEDTIME AS NEEDED FOR MUSCLE SPASMS. 30 capsule 2  . Zinc 50 MG TABS Take by mouth daily in the afternoon.    . Cyanocobalamin (VITAMIN B-12 PO) Take by mouth daily in the afternoon. (Patient not taking: Reported on 05/17/2021)     No facility-administered medications prior to visit.    Review of Systems;  Patient denies headache, fevers, malaise, unintentional weight loss, skin rash, eye pain, sinus congestion and sinus pain, sore throat, dysphagia,  hemoptysis , cough, dyspnea, wheezing, chest pain, palpitations, orthopnea, edema, abdominal pain,  nausea, melena, diarrhea, constipation, flank pain, dysuria, hematuria, urinary  Frequency, nocturia, numbness, tingling, seizures,  Focal weakness, Loss of consciousness,  Tremor, insomnia, depression, anxiety, and suicidal ideation.      Objective:  BP 112/72   Pulse 83   Temp 98.1 F (36.7 C)   Resp 16   Ht 5\' 2"  (1.575 m)   Wt 134 lb 6.4 oz (61 kg)   SpO2 98%   BMI 24.58 kg/m   BP Readings from Last 3 Encounters:  05/17/21 112/72  02/14/21 122/80  11/29/20 116/69    Wt Readings from Last 3 Encounters:  05/17/21 134 lb 6.4 oz (61 kg)  02/14/21 133 lb 9.6 oz (60.6 kg)  01/30/21 127 lb (57.6 kg)    General appearance:  alert, cooperative and appears stated age Ears: normal TM's and external ear canals both ears Throat: lips, mucosa, and tongue normal; teeth and gums normal Neck: no adenopathy, no carotid bruit, supple, symmetrical, trachea midline and thyroid not enlarged, symmetric, no tenderness/mass/nodules Back: symmetric, no curvature. ROM normal. No CVA tenderness. Lungs: clear to auscultation bilaterally Heart: regular rate and rhythm, S1, S2 normal, no murmur, click, rub or gallop Abdomen: soft, non-tender; bowel sounds normal; no masses,  no organomegaly Pulses: 2+ and symmetric Skin: Skin color, texture, turgor normal. No rashes or lesions Lymph nodes: Cervical, supraclavicular, and axillary nodes normal.  Lab Results  Component Value Date   HGBA1C 5.1 10/28/2015    Lab Results  Component Value Date   CREATININE 0.66 11/18/2018   CREATININE 0.76 11/06/2017   CREATININE 0.74 11/01/2016    Lab Results  Component Value Date   WBC 7.1 05/17/2021   HGB 13.5 05/17/2021   HCT 38.6 05/17/2021   PLT 201.0 05/17/2021   GLUCOSE 87 11/18/2018   CHOL 179 11/29/2020   TRIG 101 11/29/2020   HDL 51 11/29/2020   LDLCALC 110 (H) 11/29/2020   ALT 22 11/18/2018   AST 27 11/18/2018   NA 137 11/18/2018   K 3.9 11/18/2018   CL 100 11/18/2018   CREATININE 0.66 11/18/2018   BUN 13 11/18/2018   CO2 22 11/18/2018   TSH 2.875 01/31/2021   HGBA1C 5.1 10/28/2015    No results found.  Assessment & Plan:   Problem List Items Addressed This Visit      Unprioritized   Focal non-cyclical breast pain    Etiology unclear. Diagnostic mammogram ordered to rule out malignancy,  Milk duct blockage       Relevant Orders   POCT urine pregnancy (Completed)   MM DIAG BREAST TOMO BILATERAL   Iron deficiency    Very mild,  Without anemia,  Found during workup for telogen effluvium following COVID infection  .  Continue iron every other day until TIBC has normalized   Lab Results  Component Value Date    IRON 113 05/17/2021   TIBC 511 (H) 01/31/2021   FERRITIN 42.5 05/17/2021         Other Visit Diagnoses    Iron deficiency anemia, unspecified iron deficiency anemia type    -  Primary   Relevant Orders   CBC with Differential/Platelet (Completed)   IBC + Ferritin (Completed)      I have discontinued Jazz L. Amon's Cyanocobalamin (VITAMIN B-12 PO). I am also having her maintain her levonorgestrel, cetirizine, Biotin, Fish Oil, Vitamin D3, meclizine, azelastine, Zinc, ferrous sulfate, omeprazole, meloxicam, and tizanidine.  No orders of the defined types were placed in this encounter.   Medications Discontinued  During This Encounter  Medication Reason  . Cyanocobalamin (VITAMIN B-12 PO)     Follow-up: No follow-ups on file.   Sherlene Shams, MD

## 2021-05-18 ENCOUNTER — Other Ambulatory Visit: Payer: Self-pay | Admitting: Internal Medicine

## 2021-05-18 DIAGNOSIS — E611 Iron deficiency: Secondary | ICD-10-CM

## 2021-05-18 DIAGNOSIS — L650 Telogen effluvium: Secondary | ICD-10-CM

## 2021-05-18 DIAGNOSIS — N644 Mastodynia: Secondary | ICD-10-CM

## 2021-05-18 LAB — CBC WITH DIFFERENTIAL/PLATELET
Basophils Absolute: 0.1 10*3/uL (ref 0.0–0.1)
Basophils Relative: 0.8 % (ref 0.0–3.0)
Eosinophils Absolute: 0.1 10*3/uL (ref 0.0–0.7)
Eosinophils Relative: 1.5 % (ref 0.0–5.0)
HCT: 38.6 % (ref 36.0–46.0)
Hemoglobin: 13.5 g/dL (ref 12.0–15.0)
Lymphocytes Relative: 31.9 % (ref 12.0–46.0)
Lymphs Abs: 2.3 10*3/uL (ref 0.7–4.0)
MCHC: 35 g/dL (ref 30.0–36.0)
MCV: 84.7 fl (ref 78.0–100.0)
Monocytes Absolute: 0.6 10*3/uL (ref 0.1–1.0)
Monocytes Relative: 7.8 % (ref 3.0–12.0)
Neutro Abs: 4.1 10*3/uL (ref 1.4–7.7)
Neutrophils Relative %: 58 % (ref 43.0–77.0)
Platelets: 201 10*3/uL (ref 150.0–400.0)
RBC: 4.55 Mil/uL (ref 3.87–5.11)
RDW: 13.1 % (ref 11.5–15.5)
WBC: 7.1 10*3/uL (ref 4.0–10.5)

## 2021-05-18 LAB — IBC + FERRITIN
Ferritin: 42.5 ng/mL (ref 10.0–291.0)
Iron: 113 ug/dL (ref 42–145)
Saturation Ratios: 28 % (ref 20.0–50.0)
Transferrin: 288 mg/dL (ref 212.0–360.0)

## 2021-05-18 NOTE — Assessment & Plan Note (Signed)
Hair loss has abated.  She has reduced her iron supplementation and repeat levels are normal.

## 2021-05-19 ENCOUNTER — Ambulatory Visit: Payer: No Typology Code available for payment source | Admitting: Internal Medicine

## 2021-05-20 ENCOUNTER — Encounter: Payer: Self-pay | Admitting: Internal Medicine

## 2021-05-20 DIAGNOSIS — E611 Iron deficiency: Secondary | ICD-10-CM | POA: Insufficient documentation

## 2021-05-20 DIAGNOSIS — N644 Mastodynia: Secondary | ICD-10-CM | POA: Insufficient documentation

## 2021-05-20 NOTE — Assessment & Plan Note (Signed)
Etiology unclear. Diagnostic mammogram ordered to rule out malignancy,  Milk duct blockage

## 2021-05-20 NOTE — Assessment & Plan Note (Signed)
Very mild,  Without anemia,  Found during workup for telogen effluvium following COVID infection  .  Continue iron every other day until TIBC has normalized   Lab Results  Component Value Date   IRON 113 05/17/2021   TIBC 511 (H) 01/31/2021   FERRITIN 42.5 05/17/2021

## 2021-05-22 ENCOUNTER — Ambulatory Visit
Admission: RE | Admit: 2021-05-22 | Discharge: 2021-05-22 | Disposition: A | Discharge status: Home / Self Care | Payer: No Typology Code available for payment source | Source: Ambulatory Visit | Attending: Internal Medicine | Admitting: Internal Medicine

## 2021-05-22 ENCOUNTER — Other Ambulatory Visit: Payer: Self-pay

## 2021-05-22 DIAGNOSIS — N644 Mastodynia: Secondary | ICD-10-CM | POA: Diagnosis present

## 2021-06-27 ENCOUNTER — Ambulatory Visit: Payer: No Typology Code available for payment source | Attending: Internal Medicine

## 2021-06-27 ENCOUNTER — Other Ambulatory Visit: Payer: Self-pay

## 2021-06-27 DIAGNOSIS — M79642 Pain in left hand: Secondary | ICD-10-CM | POA: Insufficient documentation

## 2021-06-27 NOTE — Therapy (Signed)
[place] [facility] MAIN [facility] SERVICES [address] [place], [place], [address] Phone: [phone]   Fax:  [phone]  Patient Details  Name: Brandi Johnson MRN: [id number] Date of Birth: [birth year] Referring Provider:  McLean-Scocuzza, French Ana *  Encounter Date: 06/27/2021   PT/OT/SLP Screening Form   Time: in______     Time out_____   Complaint  L 5th digit pain  Past Medical Hx:  history of neck issues but no history of pain in hand Injury Date: a few weeks Pain Scale: 5/10 Patient's phone number:   Hx (this occurrence):  Patient reports a gradual increase in pain in lateral aspect of L 5th finger. 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 Pain is not radiating from anywhere, staying in muscle.    Assessment: Slight muscle knots noted in abductor digiti minimi Hand: Weakness: WFL with exception of 5th finger abduction 4- slight tenderness Wrist WFL Elbow WFL Posture: slight rounding of L shoulder with fatigue however posture is functional.   Trigger Point Dry Needling (TDN), unbilled Education performed with patient regarding potential benefit of TDN. Reviewed precautions and risks with patient. Reviewed special precautions/risks over lung fields which include pneumothorax. Reviewed signs and symptoms of pneumothorax and advised pt to go to ER immediately if these symptoms develop advise them of dry needling treatment. Extensive time spent with pt to ensure full understanding of TDN risks. Pt provided verbal consent to treatment. TDN performed to  with 0.25 x 40 single needle placements with local twitch response (LTR). Pistoning technique utilized. Improved pain-free motion following intervention. TDN provided to L digiti minimi, pronator and supinator musculature.     Recommendations:    Comments: Patient presents with symptoms consistent with strain of abductor  digiti minimi. Patient educated on stretches to perform, educated and given buddy tape protocol, and PT performed dry needling. No further therapy required at this time unless symptoms progressively worsen upon which patient has been given advice to call therapist to re-examine.   Access Code: [email or web address] URL: [email or web address] Date: 06/27/2021 Prepared by: Precious Bard  Exercises Ulnar Nerve Flossing - 1 x daily - 7 x weekly - 2 sets - 10 reps - 5 hold Seated Wrist Flexion Active Stretch Pronated with Elbow Straight - 1 x daily - 7 x weekly - 2 sets - 2 reps - 30 hold Bicep Stretch at Table - 1 x daily - 7 x weekly - 2 sets - 2 reps - 30 hold Upper Trapezius Stretch - 1 x daily - 7 x weekly - 2 sets - 2 reps - 30 hold   []  Patient would benefit from an MD referral []  Patient would benefit from a full PT/OT/ SLP evaluation and treatment. [x]  No intervention recommended at this time.  , PT, DPT  06/27/2021, 7:47 AM  [place] [facility] MAIN [facility] SERVICES [address] [place], [facility], [address] Phone: [phone]   Fax:  [phone]

## 2021-12-04 ENCOUNTER — Encounter: Payer: Self-pay | Admitting: Certified Nurse Midwife

## 2021-12-04 ENCOUNTER — Ambulatory Visit (INDEPENDENT_AMBULATORY_CARE_PROVIDER_SITE_OTHER): Payer: No Typology Code available for payment source | Admitting: Certified Nurse Midwife

## 2021-12-04 ENCOUNTER — Other Ambulatory Visit: Payer: Self-pay

## 2021-12-04 VITALS — BP 108/68 | HR 84 | Ht 61.5 in | Wt 134.9 lb

## 2021-12-04 DIAGNOSIS — Z01419 Encounter for gynecological examination (general) (routine) without abnormal findings: Secondary | ICD-10-CM | POA: Diagnosis not present

## 2021-12-04 NOTE — Progress Notes (Signed)
GYNECOLOGY ANNUAL PREVENTATIVE CARE ENCOUNTER NOTE  History:     Brandi Johnson is a 35 y.o. [id number] female here for a routine annual gynecologic exam.  Current complaints: none.   Denies abnormal vaginal bleeding, discharge, pelvic pain, problems with intercourse or other gynecologic concerns.     Social Relationship: married  Living:spouse and children Work:[employer] Exercise: 3-4x wk  Smoke/Alcohol/drug use: none   Gynecologic History No LMP recorded (lmp unknown). (Menstrual status: IUD). Contraception: IUD Last Pap: 11/18/18. Results were: normal with negative HPV Last mammogram: n/a .   Obstetric History OB History  Gravida Para Term Preterm AB Living  2 2 2     2   SAB IAB Ectopic Multiple Live Births          2    # Outcome Date GA Lbr Len/2nd Weight Sex Delivery Anes PTL Lv  2 Term 03/01/14   7 lb 6.4 oz (3.357 kg) F CS-LTranv Spinal  LIV  1 Term 06/19/11   8 lb 1.6 oz (3.674 kg) M CS-LTranv   LIV    Past Medical History:  Diagnosis Date   Acute bronchitis due to COVID-19 virus 10/17/2020   AR (allergic rhinitis)    Hemorrhoids in pregnancy    History of renal calculi    with hydroneprosis noted during pregnancy   Hydronephrosis    Motion sickness    UTI (lower urinary tract infection)     Past Surgical History:  Procedure Laterality Date   CESAREAN SECTION  2012    Current Outpatient Medications on File Prior to Visit  Medication Sig Dispense Refill   azelastine (ASTELIN) 0.1 % nasal spray Place into both nostrils.     cetirizine (ZYRTEC) 10 MG tablet Take 1 tablet (10 mg total) daily by mouth. 90 tablet 4   Cholecalciferol (VITAMIN D3) 1.25 MG (50000 UT) TABS Take by mouth.     levonorgestrel (MIRENA) 20 MCG/24HR IUD 1 each by Intrauterine route once.     meclizine (ANTIVERT) 25 MG tablet Take 25 mg by mouth daily as needed.     meloxicam (MOBIC) 15 MG tablet TAKE 1 TABLET BY MOUTH DAILY AS NEEDED FOR PAIN. 30 tablet 1   Omega-3  Fatty Acids (FISH OIL) 1000 MG CAPS Take by mouth.     tizanidine (ZANAFLEX) 2 MG capsule TAKE 1 CAPSULE (2 MG TOTAL) BY MOUTH AT BEDTIME AS NEEDED FOR MUSCLE SPASMS. 30 capsule 2   Zinc 50 MG TABS Take by mouth daily in the afternoon.     Biotin 10 MG CAPS Take 10 mg by mouth daily.  (Patient not taking: Reported on 12/04/2021)     ferrous sulfate 325 (65 FE) MG tablet TAKE 1 TABLET BY MOUTH TWICE DAILY WITH FOOD (Patient not taking: Reported on 12/04/2021) 60 tablet 3   omeprazole (PRILOSEC) 20 MG capsule TAKE 1 CAPSULE BY MOUTH DAILY AS NEEDED. (Patient not taking: Reported on 12/04/2021) 30 capsule 2   No current facility-administered medications on file prior to visit.    Allergies  Allergen Reactions   Sulfa Antibiotics Rash    Social History:  reports that she has never smoked. She has never used smokeless tobacco. She reports that she does not drink alcohol and does not use drugs.  Family History  Problem Relation Age of Onset   Mental illness Mother    Colitis Mother        collagenous   Colon polyps Mother    Heart disease Father 73  CAD s/p CABG   Diabetes Father    Skin cancer Father    Diabetes Maternal Grandmother    Hyperlipidemia Maternal Grandmother    Stroke Maternal Grandmother    Stroke Paternal Grandmother    Celiac disease Sister    Cancer Neg Hx     The following portions of the patient's history were reviewed and updated as appropriate: allergies, current medications, past family history, past medical history, past social history, past surgical history and problem list.  Review of Systems Pertinent items noted in HPI and remainder of comprehensive ROS otherwise negative.  Physical Exam:  BP 108/68   Pulse 84   Ht 5' 1.5" (1.562 m)   Wt 134 lb 14.4 oz (61.2 kg)   LMP  (LMP Unknown)   BMI 25.08 kg/m  CONSTITUTIONAL: Well-developed, well-nourished female in no acute distress.  HENT:  Normocephalic, atraumatic, External right and left ear normal.  Oropharynx is clear and moist EYES: Conjunctivae and EOM are normal. Pupils are equal, round, and reactive to light. No scleral icterus.  NECK: Normal range of motion, supple, no masses.  Normal thyroid.  SKIN: Skin is warm and dry. No rash noted. Not diaphoretic. No erythema. No pallor. MUSCULOSKELETAL: Normal range of motion. No tenderness.  No cyanosis, clubbing, or edema.  2+ distal pulses. NEUROLOGIC: Alert and oriented to person, place, and time. Normal reflexes, muscle tone coordination.  PSYCHIATRIC: Normal mood and affect. Normal behavior. Normal judgment and thought content. CARDIOVASCULAR: Normal heart rate noted, regular rhythm RESPIRATORY: Clear to auscultation bilaterally. Effort and breath sounds normal, no problems with respiration noted. BREASTS: Symmetric in size. No masses, tenderness, skin changes, nipple drainage, or lymphadenopathy bilaterally.  ABDOMEN: Soft, no distention noted.  No tenderness, rebound or guarding.  PELVIC: Normal appearing external genitalia and urethral meatus; normal appearing vaginal mucosa and cervix.  No abnormal discharge noted.  Pap smear not due.  Normal uterine size, no other palpable masses, no uterine or adnexal tenderness.  .   Assessment and Plan:    1. Well woman exam with routine gynecological exam   Pap: not due  Mammogram : n/a  Labs:declines  Refills: none  Referral: none  Routine preventative health maintenance measures emphasized. Please refer to After Visit Summary for other counseling recommendations.      Philip Aspen, CNM [facility]

## 2021-12-04 NOTE — Patient Instructions (Signed)

## 2022-02-09 ENCOUNTER — Encounter: Payer: Self-pay | Admitting: Internal Medicine

## 2022-02-09 DIAGNOSIS — M79641 Pain in right hand: Secondary | ICD-10-CM

## 2022-02-12 NOTE — Telephone Encounter (Signed)
Pt would like to be called regarding previous message

## 2022-02-13 NOTE — Telephone Encounter (Signed)
Please do not use mychart messages for matters that are urgent.  I cannot answer 30 messages per day .    The x ray has been ordered.  You will [facility].   Regards,   Duncan Dull, MD

## 2022-02-16 ENCOUNTER — Ambulatory Visit: Payer: No Typology Code available for payment source | Admitting: Internal Medicine

## 2022-02-21 ENCOUNTER — Ambulatory Visit: Payer: No Typology Code available for payment source | Admitting: Internal Medicine

## 2022-02-27 ENCOUNTER — Other Ambulatory Visit: Payer: Self-pay

## 2022-03-20 ENCOUNTER — Telehealth: Payer: No Typology Code available for payment source | Admitting: Physician Assistant

## 2022-03-20 DIAGNOSIS — B029 Zoster without complications: Secondary | ICD-10-CM

## 2022-03-20 MED ORDER — VALACYCLOVIR HCL 1 G PO TABS: 1000.0000 mg | ORAL_TABLET | Freq: Three times a day (TID) | ORAL | 0 refills | Status: DC

## 2022-03-20 MED ORDER — GABAPENTIN 300 MG PO CAPS: 300.0000 mg | ORAL_CAPSULE | Freq: Two times a day (BID) | ORAL | 0 refills | Status: DC | PRN

## 2022-03-20 NOTE — Addendum Note (Signed)
Addended by: Margaretann Loveless on: 03/20/2022 12:42 PM ? ? Modules accepted: Orders ? ?

## 2022-03-20 NOTE — Progress Notes (Signed)
E-visit for Shingles   We are sorry that you are not feeling well. Here is how we plan to help!  Based on what you shared with me it looks like you have shingles.  Shingles or herpes zoster, is a common infection of the nerves.  It is a painful rash caused by the herpes zoster virus.  This is the same virus that causes chickenpox.  After a person has chickenpox, the virus remains inactive in the nerve cells.  Years later, the virus can become active again and travel to the skin.  It typically will appear on one side of the face or body.  Burning or shooting pain, tingling, or itching are early signs of the infection.  Blisters typically scab over in 7 to 10 days and clear up within 2-4 weeks. Shingles is only contagious to people that have never had the chickenpox, the chickenpox vaccine, or anyone who has a compromised immune system.  You should avoid contact with these type of people until your blisters scab over.  I have prescribed Valacyclovir 1g three times daily for 7 days and also Gabapentin 300mg twice daily as needed for pain   HOME CARE: Apply ice packs (wrapped in a thin towel), cool compresses, or soak in cool bath to help reduce pain. Use calamine lotion to calm itchy skin. Avoid scratching the rash. Avoid direct sunlight.  GET HELP RIGHT AWAY IF: Symptoms that don't away after treatment. A rash or blisters near your eye. Increased drainage, fever, or rash after treatment. Severe pain that doesn't go away.   MAKE SURE YOU   Understand these instructions. Will watch your condition. Will get help right away if you are not doing well or get worse.  Thank you for choosing an e-visit.  Your e-visit answers were reviewed by a board certified advanced clinical practitioner to complete your personal care plan. Depending upon the condition, your plan could have included both over the counter or prescription medications.  Please review your pharmacy choice. Make sure the pharmacy  is open so you can pick up prescription now. If there is a problem, you may contact your provider through MyChart messaging and have the prescription routed to another pharmacy.  Your safety is important to us. If you have drug allergies check your prescription carefully.   For the next 24 hours you can use MyChart to ask questions about today's visit, request a non-urgent call back, or ask for a work or school excuse. You will get an email in the next two days asking about your experience. I hope that your e-visit has been valuable and will speed your recovery.  I provided 5 minutes of non face-to-face time during this encounter for chart review and documentation.   

## 2022-03-23 ENCOUNTER — Ambulatory Visit (INDEPENDENT_AMBULATORY_CARE_PROVIDER_SITE_OTHER): Payer: No Typology Code available for payment source | Admitting: Internal Medicine

## 2022-03-23 ENCOUNTER — Other Ambulatory Visit: Payer: Self-pay

## 2022-03-23 ENCOUNTER — Encounter: Payer: Self-pay | Admitting: Internal Medicine

## 2022-03-23 VITALS — BP 110/80 | HR 91 | Temp 98.0°F | Resp 16 | Ht 61.5 in | Wt 138.2 lb

## 2022-03-23 DIAGNOSIS — L298 Other pruritus: Secondary | ICD-10-CM

## 2022-03-23 DIAGNOSIS — Z1389 Encounter for screening for other disorder: Secondary | ICD-10-CM | POA: Diagnosis not present

## 2022-03-23 DIAGNOSIS — Z113 Encounter for screening for infections with a predominantly sexual mode of transmission: Secondary | ICD-10-CM

## 2022-03-23 DIAGNOSIS — Z0000 Encounter for general adult medical examination without abnormal findings: Secondary | ICD-10-CM

## 2022-03-23 DIAGNOSIS — E559 Vitamin D deficiency, unspecified: Secondary | ICD-10-CM | POA: Diagnosis not present

## 2022-03-23 DIAGNOSIS — B029 Zoster without complications: Secondary | ICD-10-CM | POA: Diagnosis not present

## 2022-03-23 DIAGNOSIS — Z13220 Encounter for screening for lipoid disorders: Secondary | ICD-10-CM

## 2022-03-23 DIAGNOSIS — R5383 Other fatigue: Secondary | ICD-10-CM

## 2022-03-23 DIAGNOSIS — Z13818 Encounter for screening for other digestive system disorders: Secondary | ICD-10-CM

## 2022-03-23 DIAGNOSIS — X58XXXA Exposure to other specified factors, initial encounter: Secondary | ICD-10-CM

## 2022-03-23 DIAGNOSIS — L2989 Other pruritus: Secondary | ICD-10-CM

## 2022-03-23 DIAGNOSIS — E611 Iron deficiency: Secondary | ICD-10-CM

## 2022-03-23 DIAGNOSIS — E538 Deficiency of other specified B group vitamins: Secondary | ICD-10-CM

## 2022-03-23 LAB — URINALYSIS, ROUTINE W REFLEX MICROSCOPIC
Bilirubin Urine: NEGATIVE
Hgb urine dipstick: NEGATIVE
Ketones, ur: NEGATIVE
Leukocytes,Ua: NEGATIVE
Nitrite: NEGATIVE
Specific Gravity, Urine: <=1.005 — AB (ref 1.000–1.030)
Total Protein, Urine: NEGATIVE
Urine Glucose: NEGATIVE
Urobilinogen, UA: 0.2 (ref 0.0–1.0)
pH: 5.5 (ref 5.0–8.0)

## 2022-03-23 LAB — COMPREHENSIVE METABOLIC PANEL
ALT: 14 U/L (ref 0–35)
AST: 21 U/L (ref 0–37)
Albumin: 4.8 g/dL (ref 3.5–5.2)
Alkaline Phosphatase: 46 U/L (ref 39–117)
BUN: 17 mg/dL (ref 6–23)
CO2: 26 mEq/L (ref 19–32)
Calcium: 8.9 mg/dL (ref 8.4–10.5)
Chloride: 109 mEq/L (ref 96–112)
Creatinine, Ser: 0.68 mg/dL (ref 0.40–1.20)
GFR: 111.97 mL/min (ref >60.00)
Glucose, Bld: 82 mg/dL (ref 70–99)
Potassium: 4.1 mEq/L (ref 3.5–5.1)
Sodium: 140 mEq/L (ref 135–145)
Total Bilirubin: 0.4 mg/dL (ref 0.2–1.2)
Total Protein: 6.6 g/dL (ref 6.0–8.3)

## 2022-03-23 LAB — CBC WITH DIFFERENTIAL/PLATELET
Basophils Absolute: 0 10*3/uL (ref 0.0–0.1)
Basophils Relative: 0.4 % (ref 0.0–3.0)
Eosinophils Absolute: 0.1 10*3/uL (ref 0.0–0.7)
Eosinophils Relative: 2.3 % (ref 0.0–5.0)
HCT: 40.2 % (ref 36.0–46.0)
Hemoglobin: 13.8 g/dL (ref 12.0–15.0)
Lymphocytes Relative: 16.9 % (ref 12.0–46.0)
Lymphs Abs: 0.8 10*3/uL (ref 0.7–4.0)
MCHC: 34.2 g/dL (ref 30.0–36.0)
MCV: 85.9 fl (ref 78.0–100.0)
Monocytes Absolute: 0.4 10*3/uL (ref 0.1–1.0)
Monocytes Relative: 7.9 % (ref 3.0–12.0)
Neutro Abs: 3.6 10*3/uL (ref 1.4–7.7)
Neutrophils Relative %: 72.5 % (ref 43.0–77.0)
Platelets: 183 10*3/uL (ref 150.0–400.0)
RBC: 4.68 Mil/uL (ref 3.87–5.11)
RDW: 12.6 % (ref 11.5–15.5)
WBC: 4.9 10*3/uL (ref 4.0–10.5)

## 2022-03-23 MED ORDER — MUPIROCIN 2 % EX OINT
1.0000 "application " | TOPICAL_OINTMENT | Freq: Two times a day (BID) | CUTANEOUS | 0 refills | Status: DC
  Filled 2022-03-23: qty 22, 5d supply, fill #0

## 2022-03-23 MED ORDER — VALACYCLOVIR HCL 1 G PO TABS
1000.0000 mg | ORAL_TABLET | Freq: Three times a day (TID) | ORAL | 0 refills | Status: DC
  Filled 2022-03-23: qty 21, 7d supply, fill #0

## 2022-03-23 MED ORDER — AZELASTINE HCL 0.1 % NA SOLN
2.0000 | Freq: Two times a day (BID) | NASAL | 11 refills | Status: AC
  Filled 2022-03-23: qty 30, 25d supply, fill #0
  Filled 2022-07-24: qty 30, 25d supply, fill #1

## 2022-03-23 NOTE — Patient Instructions (Addendum)
Broth  ?Bananas  ?Eggs ?Rice  ?Applesauce  ?Crackers ?Toast  ? ?As needed immodium  ? ? ?Shingles ?Shingles, which is also known as herpes zoster, is an infection that causes a painful skin rash and fluid-filled blisters. It is caused by a virus. ?Shingles only develops in people who: ?Have had chickenpox. ?Have been vaccinated against chickenpox. Shingles is rare in this group. ?What are the causes? ?Shingles is caused by varicella-zoster virus. This is the same virus that causes chickenpox. After a person is exposed to the virus, it stays in the body in an inactive (dormant) state. Shingles develops if the virus is reactivated. This can happen many years after the first (initial) exposure to the virus. It is not known what causes this virus to be reactivated. ?What increases the risk? ?People who have had chickenpox or received the chickenpox vaccine are at risk for shingles. Shingles infection is more common in people who: ?Are older than 37 years of age. ?Have a weakened disease-fighting system (immune system), such as people with: ?HIV (human immunodeficiency virus). ?AIDS (acquired immunodeficiency syndrome). ?Cancer. ?Are taking medicines that weaken the immune system, such as organ transplant medicines. ?Are experiencing a lot of stress. ?What are the signs or symptoms? ?Early symptoms of this condition include itching, tingling, and pain in an area on your skin. Pain may be described as burning, stabbing, or throbbing. ?A few days or weeks after early symptoms start, a painful red rash appears. The rash is usually on one side of the body and has a band-like or belt-like pattern. The rash eventually turns into fluid-filled blisters that break open, change into scabs, and dry up in about 2-3 weeks. ?At any time during the infection, you may also develop: ?A fever. ?Chills. ?A headache. ?Nausea. ?How is this diagnosed? ?This condition is diagnosed with a skin exam. Skin or fluid samples (a culture) may be  taken from the blisters before a diagnosis is made. ?How is this treated? ?The rash may last for several weeks. There is not a specific cure for this condition. Your health care provider may prescribe medicines to help you manage pain, recover more quickly, and avoid long-term problems. Medicines may include: ?Antiviral medicines. ?Anti-inflammatory medicines. ?Pain medicines. ?Anti-itching medicines (antihistamines). ?If the area involved is on your face, you may be referred to a specialist, such as an eye doctor (ophthalmologist) or an ear, nose, and throat (ENT) doctor (otorhinolaryngologist) to help you avoid eye problems, chronic pain, or disability. ?Follow these instructions at home: ?Medicines ?Take over-the-counter and prescription medicines only as told by your health care provider. ?Apply an anti-itch cream or numbing cream to the affected area as told by your health care provider. ?Relieving itching and discomfort ? ?Apply cold, wet cloths (cold compresses) to the area of the rash or blisters as told by your health care provider. ?Cool baths can be soothing. Try adding baking soda or dry oatmeal to the water to reduce itching. Do not bathe in hot water. ?Use calamine lotion as recommended by your health care provider. This is an over-the-counter lotion that helps to relieve itchiness. ?Blister and rash care ?Keep your rash covered with a loose bandage (dressing). Wear loose-fitting clothing to help ease the pain of material rubbing against the rash. ?Wash your hands with soap and water for at least 20 seconds before and after you change your dressing. If soap and water are not available, use hand sanitizer. ?Change your dressing as told by your health care provider. ?Keep your  rash and blisters clean by washing the area with mild soap and cool water as told by your health care provider. ?Check your rash every day for signs of infection. Check for: ?More redness, swelling, or pain. ?Fluid or  blood. ?Warmth. ?Pus or a bad smell. ?Do not scratch your rash or pick at your blisters. To help avoid scratching: ?Keep your fingernails clean and cut short. ?Wear gloves or mittens while you sleep, if scratching is a problem. ?General instructions ?Rest as told by your health care provider. ?Wash your hands often with soap and water for at least 20 seconds. If soap and water are not available, use hand sanitizer. Doing this lowers your chance of getting a bacterial skin infection. ?Before your blisters change into scabs, your shingles infection can cause chickenpox in people who have never had it or have never been vaccinated against it. To prevent this from happening, avoid contact with other people, especially: ?Babies. ?Pregnant women. ?Children who have eczema. ?Older people who have transplants. ?People who have chronic illnesses, such as cancer or AIDS. ?Keep all follow-up visits. This is important. ?How is this prevented? ?Getting vaccinated is the best way to prevent shingles and protect against shingles complications. If you have not been vaccinated, talk with your health care provider about getting the vaccine. ?Where to find more information ?Centers for Disease Control and Prevention: http://www.wolf.info/ ?Contact a health care provider if: ?Your pain is not relieved with prescribed medicines. ?Your pain does not get better after the rash heals. ?You have any of these signs of infection: ?More redness, swelling, or pain around the rash. ?Fluid or blood coming from the rash. ?Warmth coming from your rash. ?Pus or a bad smell coming from the rash. ?A fever. ?Get help right away if: ?The rash is on your face or nose. ?You have facial pain, pain around your eye area, or loss of feeling on one side of your face. ?You have difficulty seeing. ?You have ear pain or have ringing in your ear. ?You have a loss of taste. ?Your condition gets worse. ?Summary ?Shingles, also known as herpes zoster, is an infection that  causes a painful skin rash and fluid-filled blisters. ?This condition is diagnosed with a skin exam. Skin or fluid samples (a culture) may be taken from the blisters. ?Keep your rash covered with a loose bandage (dressing). Wear loose-fitting clothing to help ease the pain of material rubbing against the rash. ?Before your blisters change into scabs, your shingles infection can cause chickenpox in people who have never had it or have never been vaccinated against it. ?This information is not intended to replace advice given to you by your health care provider. Make sure you discuss any questions you have with your health care provider. ?Document Revised: 12/12/2020 Document Reviewed: 12/12/2020 ?Elsevier Patient Education ? 2022 [place]. ? ? ? ?Bland Diet ?A bland diet consists of foods that are often soft and do not have a lot of fat, fiber, or extra seasonings. Foods without fat, fiber, or seasoning are easier for the body to digest. They are also less likely to irritate your mouth, throat, stomach, and other parts of your digestive system. A bland diet is sometimes called a BRAT diet. ?What is my plan? ?Your health care provider or food and nutrition specialist (dietitian) may recommend specific changes to your diet to prevent symptoms or to treat your symptoms. These changes may include: ?Eating small meals often. ?Cooking food until it is soft enough to chew easily. ?  Chewing your food well. ?Drinking fluids slowly. ?Not eating foods that are very spicy, sour, or fatty. ?Not eating citrus fruits, such as oranges and grapefruit. ?What do I need to know about this diet? ?Eat a variety of foods from the bland diet food list. ?Do not follow a bland diet longer than needed. ?Ask your health care provider whether you should take vitamins or supplements. ?What foods can I eat? ?Grains ?Hot cereals, such as cream of wheat. Rice. Bread, crackers, or tortillas made from refined white flour. ?Vegetables ?Canned or  cooked vegetables. Mashed or boiled potatoes. ?Fruits ?Bananas. Applesauce. Other types of cooked or canned fruit with the skin and seeds removed, such as canned peaches or pears. ?Meats and other proteins ?Scrambled eggs. Cr

## 2022-03-23 NOTE — Progress Notes (Signed)
Chief Complaint  ?Patient presents with  ? Acute Visit  ?  Last Tuesday she noticed red spot on face which transitioned to patchy skin with a burning and itching sensation. Had applied mupirocin w/o relief. Scheduled E visit on 03/20/22 and saw Joycelyn Man PA and diagnosed her with shingles. She was prescribed valtrex which has helped her sx's but after starting medication she developed diarrhea. She also c/o itching in her nose x3-4 wks. Refill on Azelastine  ? ?F/u  ?03/20/22 had shingles left eyelid, left nose started red and patchy then bumps and ulceration with burning sensation and itching in nose tried bactroban w/o relief given valtrex 1g tid from virtual visit and gabapentin and sxs better but had diarrhea after starting meds which reviewed could be side effects of both less likely with valtrex pt was only taking valtrex bid due to diarrhea. Itching in nose x 3-4 weeks though was allergies tried zyrtec and astelin expired since 2021 w/o help  ? ?[place] 12/2021 wants to be check hep B/C/hiv pt was tested ? ?Review of Systems  ?Constitutional:  Negative for weight loss.  ?HENT:  Negative for hearing loss.   ?Eyes:  Negative for blurred vision.  ?Respiratory:  Negative for shortness of breath.   ?Cardiovascular:  Negative for chest pain.  ?Gastrointestinal:  Negative for abdominal pain and blood in stool.  ?Genitourinary:  Negative for dysuria.  ?Musculoskeletal:  Negative for falls and joint pain.  ?Skin:  Positive for itching and rash.  ?Neurological:  Negative for headaches.  ?Psychiatric/Behavioral:  Negative for depression.   ?Past Medical History:  ?Diagnosis Date  ? Acute bronchitis due to COVID-19 virus 10/17/2020  ? AR (allergic rhinitis)   ? Hemorrhoids in pregnancy   ? History of renal calculi   ? with hydroneprosis noted during pregnancy  ? Hydronephrosis   ? Motion sickness   ? UTI (lower urinary tract infection)   ? ?Past Surgical History:  ?Procedure Laterality Date  ? CESAREAN SECTION   2012  ? ?Family History  ?Problem Relation Age of Onset  ? Mental illness Mother   ? Colitis Mother   ?     collagenous  ? Colon polyps Mother   ? Heart disease Father 31  ?     CAD s/p CABG  ? Diabetes Father   ? Skin cancer Father   ? Diabetes Maternal Grandmother   ? Hyperlipidemia Maternal Grandmother   ? Stroke Maternal Grandmother   ? Stroke Paternal Grandmother   ? Celiac disease Sister   ? Cancer Neg Hx   ? ?Social History  ? ?Socioeconomic History  ? Marital status: Married  ?  Spouse name: Not on file  ? Number of children: Not on file  ? Years of education: Not on file  ? Highest education level: Not on file  ?Occupational History  ? Not on file  ?Tobacco Use  ? Smoking status: Never  ? Smokeless tobacco: Never  ?Vaping Use  ? Vaping Use: Never used  ?Substance and Sexual Activity  ? Alcohol use: No  ? Drug use: No  ? Sexual activity: Yes  ?  Birth control/protection: I.U.D.  ?  Comment: Mirena  ?Other Topics Concern  ? Not on file  ?Social History Narrative  ? Not on file  ? ?Social Determinants of Health  ? ?Financial Resource Strain: Not on file  ?Food Insecurity: Not on file  ?Transportation Needs: Not on file  ?Physical Activity: Not on file  ?Stress: Not on  file  ?Social Connections: Not on file  ?Intimate Partner Violence: Not on file  ? ?Current Meds  ?Medication Sig  ? cetirizine (ZYRTEC) 10 MG tablet Take 1 tablet (10 mg total) daily by mouth.  ? Cholecalciferol (VITAMIN D3) 1.25 MG (50000 UT) TABS Take by mouth.  ? gabapentin (NEURONTIN) 300 MG capsule Take 1 capsule (300 mg total) by mouth 2 (two) times daily as needed.  ? levonorgestrel (MIRENA) 20 MCG/24HR IUD 1 each by Intrauterine route once.  ? meclizine (ANTIVERT) 25 MG tablet Take 25 mg by mouth daily as needed.  ? mupirocin ointment (BACTROBAN) 2 % Apply 1 application. topically 2 (two) times daily. X 5 days  ? Omega-3 Fatty Acids (FISH OIL) 1000 MG CAPS Take by mouth.  ? Zinc 50 MG TABS Take by mouth daily in the afternoon.  ?  [DISCONTINUED] azelastine (ASTELIN) 0.1 % nasal spray Place into both nostrils.  ? [DISCONTINUED] valACYclovir (VALTREX) 1000 MG tablet Take 1 tablet (1,000 mg total) by mouth 3 (three) times daily for 7 days.  ? ?Allergies  ?Allergen Reactions  ? Sulfa Antibiotics Rash  ? ?No results found for this or any previous visit (from the past 2160 hour(s)). ?Objective  ?Body mass index is 25.69 kg/m?. ?Wt Readings from Last 3 Encounters:  ?03/23/22 138 lb 3.2 oz (62.7 kg)  ?12/04/21 134 lb 14.4 oz (61.2 kg)  ?05/17/21 134 lb 6.4 oz (61 kg)  ? ?Temp Readings from Last 3 Encounters:  ?03/23/22 98 ?F (36.7 ?C) (Oral)  ?05/17/21 98.1 ?F (36.7 ?C)  ?02/14/21 98.4 ?F (36.9 ?C) (Oral)  ? ?BP Readings from Last 3 Encounters:  ?03/23/22 110/80  ?12/04/21 108/68  ?05/17/21 112/72  ? ?Pulse Readings from Last 3 Encounters:  ?03/23/22 91  ?12/04/21 84  ?05/17/21 83  ? ? ?Physical Exam ?Vitals and nursing note reviewed.  ?Constitutional:   ?   Appearance: Normal appearance. She is well-developed and well-groomed.  ?HENT:  ?   Head: Normocephalic and atraumatic.  ? ?Eyes:  ?   Conjunctiva/sclera: Conjunctivae normal.  ?   Pupils: Pupils are equal, round, and reactive to light.  ?Cardiovascular:  ?   Rate and Rhythm: Normal rate and regular rhythm.  ?   Heart sounds: Normal heart sounds. No murmur heard. ?Pulmonary:  ?   Effort: Pulmonary effort is normal.  ?   Breath sounds: Normal breath sounds.  ?Abdominal:  ?   General: Abdomen is flat. Bowel sounds are normal.  ?   Tenderness: There is no abdominal tenderness.  ?Musculoskeletal:     ?   General: No tenderness.  ?Skin: ?   General: Skin is warm and dry.  ?Neurological:  ?   General: No focal deficit present.  ?   Mental Status: She is alert and oriented to person, place, and time. Mental status is at baseline.  ?   Cranial Nerves: Cranial nerves 2-12 are intact.  ?   Motor: Motor function is intact.  ?   Coordination: Coordination is intact.  ?   Gait: Gait is intact.  ?Psychiatric:      ?   Attention and Perception: Attention and perception normal.     ?   Mood and Affect: Mood and affect normal.     ?   Speech: Speech normal.     ?   Behavior: Behavior normal. Behavior is cooperative.     ?   Thought Content: Thought content normal.     ?   Cognition and  Memory: Cognition and memory normal.     ?   Judgment: Judgment normal.  ? ? ?Assessment  ?Plan  ?Herpes zoster without complication left eye and nasal bridge - Plan: valACYclovir (VALTREX) 1000 MG tablet tid x 7-14 days ? ?Nasal itching - Plan: mupirocin ointment (BACTROBAN) 2 % ? ?Needle stick ?Encounter for screening for other digestive system disorders - Plan: Hepatitis C antibody, RPR, Hepatitis B surface antibody,quantitative, Hepatitis B Core Antibody, total, Hepatitis B Surface AntiGEN ? ?Screening for venereal disease (VD) - Plan: Hepatitis C antibody, HIV antibody (with reflex), RPR, Hepatitis B surface antibody,quantitative, Hepatitis B Core Antibody, total, Hepatitis B Surface AntiGEN ? ? ?Exposure to needle, initial encounter - Plan: RPR, Hepatitis B surface antibody,quantitative, Hepatitis B Core Antibody, total, Hepatitis B Surface AntiGEN  ? ? ?Provider: Dr. French Ana [facility]  ?

## 2022-03-26 ENCOUNTER — Other Ambulatory Visit: Payer: Self-pay

## 2022-03-26 LAB — HIV ANTIBODY (ROUTINE TESTING W REFLEX): HIV 1&2 Ab, 4th Generation: NONREACTIVE

## 2022-03-26 LAB — HEPATITIS B SURFACE ANTIGEN: Hepatitis B Surface Ag: NONREACTIVE

## 2022-03-26 LAB — RPR: RPR Ser Ql: NONREACTIVE

## 2022-03-26 LAB — HEPATITIS C ANTIBODY
Hepatitis C Ab: NONREACTIVE
SIGNAL TO CUT-OFF: 0.08 (ref <1.00)

## 2022-03-26 LAB — HEPATITIS B CORE ANTIBODY, TOTAL: Hep B Core Total Ab: NONREACTIVE

## 2022-03-26 LAB — HEPATITIS B SURFACE ANTIBODY, QUANTITATIVE: Hep B S AB Quant (Post): 16 m[IU]/mL (ref >=10)

## 2022-03-27 ENCOUNTER — Encounter: Payer: Self-pay | Admitting: Internal Medicine

## 2022-03-27 LAB — IBC + FERRITIN
Ferritin: 91.8 ng/mL (ref 10.0–291.0)
Iron: 47 ug/dL (ref 42–145)
Saturation Ratios: 11.8 % — ABNORMAL LOW (ref 20.0–50.0)
TIBC: 397.6 ug/dL (ref 250.0–450.0)
Transferrin: 284 mg/dL (ref 212.0–360.0)

## 2022-03-27 LAB — TSH: TSH: 1.63 u[IU]/mL (ref 0.35–5.50)

## 2022-03-27 LAB — VITAMIN B12: Vitamin B-12: 294 pg/mL (ref 211–911)

## 2022-03-27 LAB — VITAMIN D 25 HYDROXY (VIT D DEFICIENCY, FRACTURES): VITD: 32.69 ng/mL (ref 30.00–100.00)

## 2022-03-27 NOTE — Telephone Encounter (Signed)
-----   Message from Bevelyn Buckles, MD sent at 03/27/2022  9:18 AM EDT ----- ?RPR negative  ?HIV and Hep B &C negative ?Protected from Hep B ?Urine with bacteria any uti sxs? could be normal contaminant ?Iron saturation low rec multivitamin with iron daily otc  ?Blood cts normal  ?Thyroid lab normal  ?Vitamin D low normal consider D3 2000 IU daily otc  ?B12 normal ?Liver kidneys normal  ? ?

## 2022-04-01 ENCOUNTER — Encounter: Payer: Self-pay | Admitting: Internal Medicine

## 2022-04-01 ENCOUNTER — Other Ambulatory Visit: Payer: Self-pay | Admitting: Internal Medicine

## 2022-04-01 DIAGNOSIS — B029 Zoster without complications: Secondary | ICD-10-CM

## 2022-04-02 ENCOUNTER — Other Ambulatory Visit: Payer: Self-pay

## 2022-04-02 ENCOUNTER — Other Ambulatory Visit: Payer: Self-pay | Admitting: Internal Medicine

## 2022-04-02 DIAGNOSIS — B029 Zoster without complications: Secondary | ICD-10-CM

## 2022-04-02 MED ORDER — VALACYCLOVIR HCL 1 G PO TABS
1000.0000 mg | ORAL_TABLET | Freq: Three times a day (TID) | ORAL | 0 refills | Status: DC
  Filled 2022-04-02: qty 21, 7d supply, fill #0

## 2022-04-02 NOTE — Telephone Encounter (Signed)
Okay to send in refill of valtrex?  ?

## 2022-04-10 ENCOUNTER — Telehealth (INDEPENDENT_AMBULATORY_CARE_PROVIDER_SITE_OTHER): Payer: No Typology Code available for payment source | Admitting: Internal Medicine

## 2022-04-10 ENCOUNTER — Encounter: Payer: Self-pay | Admitting: Internal Medicine

## 2022-04-10 DIAGNOSIS — B029 Zoster without complications: Secondary | ICD-10-CM | POA: Insufficient documentation

## 2022-04-10 DIAGNOSIS — J0100 Acute maxillary sinusitis, unspecified: Secondary | ICD-10-CM

## 2022-04-10 MED ORDER — AMOXICILLIN-POT CLAVULANATE 875-125 MG PO TABS: 1.0000 | ORAL_TABLET | Freq: Two times a day (BID) | ORAL | 0 refills | Status: DC

## 2022-04-10 MED ORDER — VALACYCLOVIR HCL 1 G PO TABS: 1000.0000 mg | ORAL_TABLET | Freq: Three times a day (TID) | ORAL | 0 refills | Status: DC

## 2022-04-10 NOTE — Assessment & Plan Note (Signed)
Starting augmentin  ?

## 2022-04-10 NOTE — Progress Notes (Signed)
Virtual Visit via Caregility  Note ? ?This visit type was conducted due to national recommendations for restrictions regarding the COVID-19 pandemic (e.g. social distancing).  This format is felt to be most appropriate for this patient at this time.  All issues noted in this document were discussed and addressed.  No physical exam was performed (except for noted visual exam findings with Video Visits).  ? ?I connected withNAME@ on 04/10/22 at  4:30 PM EDT by a video enabled telemedicine application  and verified that I am speaking with the correct person using two identifiers. ?Location patient: home ?Location provider: work or home office ?Persons participating in the virtual visit: patient, provider ? ?I discussed the limitations, risks, security and privacy concerns of performing an evaluation and management service by telephone and the availability of in person appointments. I also discussed with the patient that there may be a patient responsible charge related to this service. The patient expressed understanding and agreed to proceed. ? ? ?Reason for visit: persistent shingles  ? ?HPI: ? ?Healty 36 yr old female presents with persistent facial rash diagnosed on March 21 during E Visit as shingles with one papule on the inner corner of her left upper eyelid.  Start Valtrex 1000 mg tid .  Saw Dr Linus Orn on march 24 who confirmed diagnosis .  Developed new singular papules on center of forehead at the hair line,  followed by more singular papules on the right temple and left preauricular area ,  papules have crusted over and do have a burning , itching quality.  Restarted antiviral on April 2 .  No fevers.  Developed sinus congestion and pressure over the maxillary sinus  5 days ago. Drainage yellow  then bilateral frontal pressure type headache for the last 48 hours.  ? ? ?ROS: See pertinent positives and negatives per HPI. ? ?Past Medical History:  ?Diagnosis Date  ? Acute bronchitis due to COVID-19 virus  10/17/2020  ? AR (allergic rhinitis)   ? Hemorrhoids in pregnancy   ? History of renal calculi   ? with hydroneprosis noted during pregnancy  ? Hydronephrosis   ? Motion sickness   ? UTI (lower urinary tract infection)   ? ? ?Past Surgical History:  ?Procedure Laterality Date  ? CESAREAN SECTION  2012  ? ? ?Family History  ?Problem Relation Age of Onset  ? Mental illness Mother   ? Colitis Mother   ?     collagenous  ? Colon polyps Mother   ? Heart disease Father 64  ?     CAD s/p CABG  ? Diabetes Father   ? Skin cancer Father   ? Diabetes Maternal Grandmother   ? Hyperlipidemia Maternal Grandmother   ? Stroke Maternal Grandmother   ? Stroke Paternal Grandmother   ? Celiac disease Sister   ? Cancer Neg Hx   ? ? ?SOCIAL HX:  reports that she has never smoked. She has never used smokeless tobacco. She reports that she does not drink alcohol and does not use drugs.  ? ? ?Current Outpatient Medications:  ?  azelastine (ASTELIN) 0.1 % nasal spray, Place 2 sprays into both nostrils 2 (two) times daily., Disp: 30 mL, Rfl: 11 ?  Biotin 10 MG CAPS, Take 10 mg by mouth daily., Disp: , Rfl:  ?  cetirizine (ZYRTEC) 10 MG tablet, Take 1 tablet (10 mg total) daily by mouth., Disp: 90 tablet, Rfl: 4 ?  Cholecalciferol (VITAMIN D3) 1.25 MG (50000 UT) TABS, Take by  mouth., Disp: , Rfl:  ?  gabapentin (NEURONTIN) 300 MG capsule, Take 1 capsule (300 mg total) by mouth 2 (two) times daily as needed., Disp: 20 capsule, Rfl: 0 ?  levonorgestrel (MIRENA) 20 MCG/24HR IUD, 1 each by Intrauterine route once., Disp: , Rfl:  ?  meclizine (ANTIVERT) 25 MG tablet, Take 25 mg by mouth daily as needed., Disp: , Rfl:  ?  mupirocin ointment (BACTROBAN) 2 %, Apply 1 application. topically 2 (two) times daily. X 5 days, Disp: 22 g, Rfl: 0 ?  Omega-3 Fatty Acids (FISH OIL) 1000 MG CAPS, Take by mouth., Disp: , Rfl:  ?  Zinc 50 MG TABS, Take by mouth daily in the afternoon., Disp: , Rfl:  ?  amoxicillin-clavulanate (AUGMENTIN) 875-125 MG tablet, Take 1  tablet by mouth 2 (two) times daily., Disp: 14 tablet, Rfl: 0 ?  ferrous sulfate 325 (65 FE) MG tablet, TAKE 1 TABLET BY MOUTH TWICE DAILY WITH FOOD (Patient not taking: Reported on 12/04/2021), Disp: 60 tablet, Rfl: 3 ?  valACYclovir (VALTREX) 1000 MG tablet, Take 1 tablet (1,000 mg total) by mouth 3 (three) times daily for 7 days., Disp: 21 tablet, Rfl: 0 ? ?EXAM: ? ?VITALS per patient if applicable: ? ?GENERAL: alert, oriented, appears well and in no acute distress ? ?HEENT: atraumatic, conjunttiva clear, no obvious abnormalities on inspection of external nose and ears ? ?NECK: normal movements of the head and neck ? ?LUNGS: on inspection no signs of respiratory distress, breathing rate appears normal, no obvious gross SOB, gasping or wheezing ? ?CV: no obvious cyanosis ? ?MS: moves all visible extremities without noticeable abnormality ?Skin:  discrete papules  on on right side of face  ? ?PSYCH/NEURO: pleasant and cooperative, no obvious depression or anxiety, speech and thought processing grossly intact ? ?ASSESSMENT AND PLAN: ? ?Discussed the following assessment and plan: ? ?Herpes zoster without complication - Plan: valACYclovir (VALTREX) 1000 MG tablet ? ?Acute non-recurrent maxillary sinusitis ? ?Herpes zoster ?She now has singular papules on the right side of her face, raising question of alternative diagnosis.  Will repeat course of famciclovir. Adding augmentin to maxillary sinusitis ? ?Maxillary sinusitis, acute ?Starting augmentin  ? ?  ?I discussed the assessment and treatment plan with the patient. The patient was provided an opportunity to ask questions and all were answered. The patient agreed with the plan and demonstrated an understanding of the instructions. ?  ?The patient was advised to call back or seek an in-person evaluation if the symptoms worsen or if the condition fails to improve as anticipated. ? ? ?I spent 30 minutes dedicated to the care of this patient on the date of this encounter  to include pre-visit review of his medical history,  Face-to-face time with the patient , and post visit ordering of testing and therapeutics.  ? ? ?Crecencio Mc, MD   ?

## 2022-04-10 NOTE — Assessment & Plan Note (Signed)
She now has singular papules on the right side of her face, raising question of alternative diagnosis.  Will repeat course of famciclovir. Adding augmentin to maxillary sinusitis ?

## 2022-04-12 ENCOUNTER — Other Ambulatory Visit: Payer: Self-pay | Admitting: Internal Medicine

## 2022-04-12 ENCOUNTER — Encounter: Payer: Self-pay | Admitting: Internal Medicine

## 2022-04-12 DIAGNOSIS — B029 Zoster without complications: Secondary | ICD-10-CM

## 2022-04-13 ENCOUNTER — Other Ambulatory Visit: Payer: Self-pay

## 2022-04-13 MED ORDER — VALACYCLOVIR HCL 1 G PO TABS
1000.0000 mg | ORAL_TABLET | Freq: Three times a day (TID) | ORAL | 0 refills | Status: DC
  Filled 2022-04-13: qty 21, 7d supply, fill #0

## 2022-04-18 ENCOUNTER — Ambulatory Visit: Payer: No Typology Code available for payment source | Admitting: Physical Therapy

## 2022-04-19 ENCOUNTER — Ambulatory Visit: Payer: No Typology Code available for payment source | Attending: Internal Medicine | Admitting: Physical Therapy

## 2022-04-19 ENCOUNTER — Encounter: Payer: Self-pay | Admitting: Certified Nurse Midwife

## 2022-04-19 DIAGNOSIS — M533 Sacrococcygeal disorders, not elsewhere classified: Secondary | ICD-10-CM | POA: Insufficient documentation

## 2022-04-19 DIAGNOSIS — M62838 Other muscle spasm: Secondary | ICD-10-CM | POA: Insufficient documentation

## 2022-04-19 NOTE — Therapy (Signed)
[place] ?[facility] ?[address] ?[place], [place], [address] ?Phone: [phone]   Fax:  [phone] ? ?Patient Details  ?Name: Brandi Johnson ?MRN: [id number] ?Date of Birth: January 14, 1986 ?Referring Provider:  Sherlene Shams, MD ? ?Encounter Date: 04/19/2022 ? ?PT/OT/SLP Screening Form ? ? ?Time: in_09:07_____    ? ?Time out: 09:28___  ? ?Complaint ___________________ ?Past Medical Hx:  ________________ ?Injury Date:________________________________  ?Pain Scale: __ __________ ?Patient?s phone number:  ? ?Hx (this occurrence):  ? ?Pt is started to feel her R SIJ is not in alignment and having pain over the last 4 weeks. Also there is a L pubic mm inside is clenching. Also she feels her uterus clenches as well. Her massage therapist noticed her L glut was tight. Pt has been stretching with stretches she found on Instagram. Pt would like to  ? ? ?Assessment: ? ? ?R ilaic crest lowered ? ?Reviewed stretches she has learned on IG : pt demo'd poor alignment to SIJ   ? ? ? ?Recommendations:   ? ?Comments: ? ?Recommended postural awareness, not shifting weight to one leg, not crossing leg  and provided alternative adductor stretch and cues for alignment to promote SIJ stability and standing posture.  ? ?[x]  Patient would benefit from an MD referral ?[]  Patient would benefit from a full PT/OT/ SLP evaluation and treatment. ?[]  No intervention recommended at this time. ? ? ? ? , PT ?04/19/2022, 9:14 AM ? ?[place] ?[facility] ?[address] ?[place], 04/21/2022, [facility] ?Phone: [phone]   Fax:  [phone] ?

## 2022-04-22 ENCOUNTER — Other Ambulatory Visit: Payer: Self-pay | Admitting: Certified Nurse Midwife

## 2022-04-22 DIAGNOSIS — G8929 Other chronic pain: Secondary | ICD-10-CM

## 2022-04-23 ENCOUNTER — Encounter: Payer: Self-pay | Admitting: Certified Nurse Midwife

## 2022-05-11 ENCOUNTER — Encounter: Payer: Self-pay | Admitting: Physical Therapy

## 2022-05-11 ENCOUNTER — Ambulatory Visit: Payer: No Typology Code available for payment source | Attending: Internal Medicine | Admitting: Physical Therapy

## 2022-05-11 DIAGNOSIS — R2689 Other abnormalities of gait and mobility: Secondary | ICD-10-CM | POA: Diagnosis present

## 2022-05-11 DIAGNOSIS — M62838 Other muscle spasm: Secondary | ICD-10-CM | POA: Diagnosis present

## 2022-05-11 DIAGNOSIS — R279 Unspecified lack of coordination: Secondary | ICD-10-CM | POA: Insufficient documentation

## 2022-05-11 DIAGNOSIS — G8929 Other chronic pain: Secondary | ICD-10-CM | POA: Insufficient documentation

## 2022-05-11 DIAGNOSIS — M533 Sacrococcygeal disorders, not elsewhere classified: Secondary | ICD-10-CM | POA: Diagnosis present

## 2022-05-11 NOTE — Patient Instructions (Signed)
Sitting on floor with low stool  ?To promote pelvic girdle stability ?__ ? ? ?Armswings and lower foot slowly at ballmounds and less heel, higher knees  ? ?__ ? Sleep with pillow between knees when on your side  ?

## 2022-05-11 NOTE — Therapy (Signed)
[place] ?[facility] ?[place], [place], [address] ?Phone: [phone]   Fax:  [phone] ? ?Physical Therapy Evaluation ? ?Patient Details  ?Name: Brandi Johnson ?MRN: [id number] ?Date of Birth: November 10, 1986 ?Referring Provider (PT): Silvana Newness CNM ? ? ?Encounter Date: 05/11/2022 ? ? PT End of Session - 05/11/22 1117   ? ? Visit Number 1   ? Number of Visits 10   ? Date for PT Re-Evaluation 07/20/22   ? PT Start Time 1104   ? PT Stop Time [id number]   ? PT Time Calculation (min) 60 min   ? Activity Tolerance Patient tolerated treatment well   ? Behavior During Therapy [facility] for tasks assessed/performed   ? ?  ?  ? ?  ? ? ?Past Medical History:  ?Diagnosis Date  ? Acute bronchitis due to COVID-19 virus 10/17/2020  ? AR (allergic rhinitis)   ? Hemorrhoids in pregnancy   ? History of renal calculi   ? with hydroneprosis noted during pregnancy  ? Hydronephrosis   ? Motion sickness   ? UTI (lower urinary tract infection)   ? ? ?Past Surgical History:  ?Procedure Laterality Date  ? CESAREAN SECTION  2012  ? ? ?There were no vitals filed for this visit. ? ? ? Subjective Assessment - 05/11/22 1113   ? ? Subjective 1) groin pain : sharp and comes off and on with standing. this pain started 1.5 weeks ago. Lasts from secs to 1 min. 6/10. Denied radiating pain.   ? ?2) Deep hip pain on R ( glut area):  off and on that feels like a coreness/ tender type pain. This pain started for the 2-3 months. Occurs with sitting, standing, L sidelying, and  lying down and getting from the bed.  Pt used to wear a shoe lift in R show but she has not been wearing one because she switched shoes.   ? ? 3) SIJ pain with standing.  Workout routine: walking 2 x week, line dancing class once a week. Used to attend classess 4-5 days a week for 30-45 min with cardio,dumbbells, spin bike, crunches/ sit ups  ?  ? Pertinent History C -section x 2   ? Patient Stated Goals be able to sleep and not be  in pain   ? ?  ?  ? ?  ? ? ? ? ? [facility] PT Assessment - 05/11/22 1111   ? ?  ? Assessment  ? Medical Diagnosis Chronic SIJ pain   ? Referring Provider (PT) Silvana Newness CNM   ?  ? Precautions  ? Precautions None   ?  ? Restrictions  ? Weight Bearing Restrictions No   ?  ? Balance Screen  ? Has the patient fallen in the past 6 months No   ?  ? Palpation  ? SI assessment  supine: ASIS and medial malleoli levelled, SIJ R hypomobility limited ilia ER rotation   ?  ? Ambulation/Gait  ? Gait Comments excessive pelvic movement, limited tthoracic movement/ armswing, heel striking ( post Tx with shoe lift in R shoe: levelled pelvis and more reciporcal gait with thorax)   ? ?  ?  ? ?  ? ? ? ? ? ? ? ? ? ? ? ? ? ?Objective measurements completed on examination: See above findings.  ? ? ? ? ? [facility] Adult PT Treatment/Exercise - 05/11/22 1202   ? ?  ? Ambulation/Gait  ? Gait Comments excessive pelvic movement,  limited tthoracic movement/ armswing, heel striking ( post Tx with shoe lift in R shoe: levelled pelvis and more reciporcal gait with thorax)   ?  ? Therapeutic Activites   ? Therapeutic Activities Other Therapeutic Activities   ? Other Therapeutic Activities discussed goals and sitting on the floor with low stool , fitted R shoe with shoel ift in toe box and heel, lseeping on R side with pillow between knees   ?  ? Neuro Re-ed   ? Neuro Re-ed Details  cued for arm swings and eccentric lowering of foot and less  heel striking in gait   ?  ? Modalities  ? Modalities Moist Heat   ?  ? Moist Heat Therapy  ? Moist Heat Location --   sacrum  ?  ? Manual Therapy  ? Manual therapy comments long axis distraction at RLE, rotational mob at R SIJ to promote ilia rotation/ ER   ? ?  ?  ? ?  ? ? ? ? ? ? ? ? ? ? ? ? ? ? ? PT Long Term Goals - 05/11/22 1126   ? ?  ? PT LONG TERM GOAL #1  ? Title Pt will demo equal alignment of pelvic girdle and report sleeping on L side with pillow between knees and report sleeping through the night  without pain   ? Baseline pain on L sidelying   ? Time 6   ? Period Weeks   ? Status New   ? Target Date 06/22/22   ?  ? PT LONG TERM GOAL #2  ? Title Pt will be able to experience no SIJ pain when on the floor playing with duaghter and son, wrapping presents on the floor   ? Baseline pain   ? Time 8   ? Period Weeks   ? Status New   ? Target Date 07/06/22   ?  ? PT LONG TERM GOAL #3  ? Title Pt will demo more armswing in her gait and less excessive movement in her L/S area in order to demo increased stability to continue her walking routine   ? Time 8   ? Period Weeks   ? Status New   ? Target Date 07/06/22   ?  ? PT LONG TERM GOAL #4  ? Title Pt will improve her FOTO score by > 5 pts in order to improve QOL   ? Baseline 54 pts   ? Time 10   ? Period Weeks   ? Status New   ? Target Date 07/20/22   ? ?  ?  ? ?  ? ? ? ? ? ? ? ? ? Plan - 05/11/22 1117   ? ? Clinical Impression Statement  ?Pt is a  36 yo  who presents with SIJ , R hip /groin pain which impacts standing, sitting, lying on her R side to sleep , and sitting on the floor.  ? ?Pt's musculoskeletal assessment revealed uneven pelvic girdle , hypomobility of R SIJ, weaker hip strength on R, limited thoracic rotation  ? ? limited spinal /pelvic mobility, dyscoordination and strength of pelvic floor mm, weak hip weakness, poor body mechanics which places strain on the abdominal/pelvic floor mm.  ? ?These are deficits that indicate an ineffective intraabdominal pressure system associated with increased risk for pt's Sx.  ? ?Advised pt to not perform sit-ups and crunches as these movement patterns lead to more downward forces on the pelvic floor, negatively impacting abdominopelvic/spinal dysfunctions.  ? ?  Pt was provided education on etiology of Sx with anatomy, physiology explanation with images along with the benefits of customized pelvic PT Tx based on pt's medical conditions and musculoskeletal deficits.  Explained the physiology of deep core mm coordination  and roles of pelvic floor function in urination, defecation, sexual function, and postural control with deep core mm system.  ? ?Following Tx today which pt tolerated without complaints, pt demo'd equal alignment of pelvic girdle and increased SIJ mobility and showed proper form with body mechanics in gait and  logrolling.  Pt benefits from skilled PT.  ?Plan to address C-section scars at next session to promote fascial mobility for optimal IAP function. ?  ? Personal Factors and Comorbidities Fitness   ? Examination-Activity Limitations Locomotion Level;Sit;Stand   ? Stability/Clinical Decision Making Evolving/Moderate complexity   ? Clinical Decision Making Moderate   ? Rehab Potential Good   ? PT Frequency 1x / week   ? PT Duration Other (comment)   10  ? PT Treatment/Interventions ADLs/Self Care Home Management;Moist Heat;Therapeutic activities;Therapeutic exercise;Patient/family education;Neuromuscular re-education;Manual techniques;Scar mobilization;Spinal Manipulations;Taping;Functional mobility training;Balance training;Stair training;Gait training;Vestibular;Joint Manipulations;Manual lymph drainage;Traction;Energy conservation   ? ?  ?  ? ?  ? ? ?Patient will benefit from skilled therapeutic intervention in order to improve the following deficits and impairments:  Decreased endurance, Decreased safety awareness, Increased muscle spasms, Decreased coordination, Abnormal gait, Decreased balance, Decreased activity tolerance, Decreased mobility, Decreased strength, Impaired sensation, Decreased scar mobility, Pain, Improper body mechanics, Hypomobility, Difficulty walking, Decreased range of motion, Increased fascial restricitons, Hypermobility ? ?Visit Diagnosis: ?Sacrococcygeal disorders, not elsewhere classified ? ?Other abnormalities of gait and mobility ? ?Unspecified lack of coordination ? ? ? ? ?Problem List ?Patient Active Problem List  ? Diagnosis Date Noted  ? Herpes zoster 04/10/2022  ? Focal  non-cyclical breast pain [id number]  ? Iron deficiency 05/20/2021  ? 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 Personal history of COVID-19 01/30/2021  ? Maxillary sinusitis, acute 10/31/2020  ? Advice given about COVID-

## 2022-05-18 ENCOUNTER — Ambulatory Visit: Payer: No Typology Code available for payment source | Admitting: Physical Therapy

## 2022-05-18 DIAGNOSIS — R279 Unspecified lack of coordination: Secondary | ICD-10-CM

## 2022-05-18 DIAGNOSIS — R2689 Other abnormalities of gait and mobility: Secondary | ICD-10-CM

## 2022-05-18 DIAGNOSIS — M533 Sacrococcygeal disorders, not elsewhere classified: Secondary | ICD-10-CM | POA: Diagnosis not present

## 2022-05-18 DIAGNOSIS — M62838 Other muscle spasm: Secondary | ICD-10-CM

## 2022-05-18 NOTE — Therapy (Signed)
[place] [facility] MAIN [facility] SERVICES [address] [place], [place], [address] Phone: [phone]   Fax:  [phone]  Physical Therapy Treatment  Patient Details  Name: Brandi Johnson MRN: [id number] Date of Birth: [birth year] Referring Provider (PT): Leola Brazil CNM   Encounter Date: 05/18/2022   PT End of Session - 05/18/22 1108     Visit Number 2    Number of Visits 10    Date for PT Re-Evaluation 07/20/22    PT Start Time 1103    PT Stop Time 1200    PT Time Calculation (min) 57 min    Activity Tolerance Patient tolerated treatment well    Behavior During Therapy [facility] for tasks assessed/performed             Past Medical History:  Diagnosis Date   Acute bronchitis due to COVID-19 virus 10/17/2020   AR (allergic rhinitis)    Hemorrhoids in pregnancy    History of renal calculi    with hydroneprosis noted during pregnancy   Hydronephrosis    Motion sickness    UTI (lower urinary tract infection)     Past Surgical History:  Procedure Laterality Date   CESAREAN SECTION  2012    There were no vitals filed for this visit.   Subjective Assessment - 05/18/22 1106     Subjective Pt reported no longer feeling the groin pain and SIJ pain since last session. Pt noticing pain still deep in her R glut. Pt wants to know if the shoe lift is inserted correctly in her new shoes. Pt stll feels pain when she sleeps but she used a [employer] under her knees.    Pertinent History C -section x 2    Patient Stated Goals be able to sleep and not be in pain                [facility] PT Assessment - 05/18/22 1156       Strength   Overall Strength Comments DF/EV R 3/5, L 4/5      Palpation   SI assessment  hypomobile L SIJ, limited in nutation on L,  tightnesss along hip abd attachments B                           [facility] Adult PT Treatment/Exercise - 05/18/22 1157       Neuro Re-ed    Neuro Re-ed Details  cued for  stretches for hip abd/ strengthening DF/EV      Manual Therapy   Manual therapy comments STM/MW along R lateral leg  ,  rotational mob at B SIJ to promotenutation                          PT Long Term Goals - 05/11/22 1126       PT LONG TERM GOAL #1   Title Pt will demo equal alignment of pelvic girdle and report sleeping on L side with pillow between knees and report sleeping through the night without pain    Baseline pain on L sidelying    Time 6    Period Weeks    Status New    Target Date 06/22/22      PT LONG TERM GOAL #2   Title Pt will be able to experience no SIJ pain when on the floor playing with duaghter and son, wrapping presents on the floor  Baseline pain    Time 8    Period Weeks    Status New    Target Date 07/06/22      PT LONG TERM GOAL #3   Title Pt will demo more armswing in her gait and less excessive movement in her L/S area in order to demo increased stability to continue her walking routine    Time 8    Period Weeks    Status New    Target Date 07/06/22      PT LONG TERM GOAL #4   Title Pt will improve her FOTO score by > 5 pts in order to improve QOL    Baseline 54 pts    Time 10    Period Weeks    Status New    Target Date 07/20/22                   Plan - 05/18/22 1108     Clinical Impression Statement Pt showed good carry over with pelvic girdle alignment bu R ilia was more anteriorly rotated, SIJ hypomobile. Manual Tx helped to mobilize SIJ and promote R lateral leg chain mm.  Pt reported decreased R glut pain from 5/10 to 0/ 10 post Tx. Added ankle DF/EV strengthening to R due to weakness which is associated with R shorter leg. Pt is complaint with wearing shoe lift. Pt continues to benefit from skilled PT   Personal Factors and Comorbidities Fitness    Examination-Activity Limitations Locomotion Level;Sit;Stand    Stability/Clinical Decision Making Evolving/Moderate complexity    Rehab Potential Good    PT  Frequency 1x / week    PT Duration Other (comment)   10   PT Treatment/Interventions ADLs/Self Care Home Management;Moist Heat;Therapeutic activities;Therapeutic exercise;Patient/family education;Neuromuscular re-education;Manual techniques;Scar mobilization;Spinal Manipulations;Taping;Functional mobility training;Balance training;Stair training;Gait training;Vestibular;Joint Manipulations;Manual lymph drainage;Traction;Energy conservation             Patient will benefit from skilled therapeutic intervention in order to improve the following deficits and impairments:  Decreased endurance, Decreased safety awareness, Increased muscle spasms, Decreased coordination, Abnormal gait, Decreased balance, Decreased activity tolerance, Decreased mobility, Decreased strength, Impaired sensation, Decreased scar mobility, Pain, Improper body mechanics, Hypomobility, Difficulty walking, Decreased range of motion, Increased fascial restricitons, Hypermobility  Visit Diagnosis: Sacrococcygeal disorders, not elsewhere classified  Other abnormalities of gait and mobility  Unspecified lack of coordination  Other muscle spasm     Problem List Patient Active Problem List   Diagnosis Date Noted   Herpes zoster 04/10/2022   Focal non-cyclical breast pain 05/20/2021   Iron deficiency 05/20/2021   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 [place], [place], [address] Phone: [phone]   Fax:  [phone]  Name: Brandi Johnson MRN:  [id number] Date of Birth: Oct 26, 1986

## 2022-05-18 NOTE — Patient Instructions (Signed)
Ankle strengthening on Rwith band band wrapped around outer L side of foot ballmound of L foot pressing onto band , R foot is placed on top of band hip width apart, with the ballmound ,  L hand holds the band 15 x 2 reps swinging R pinky toe out   X 2x day   __  Figure 4 and cross over stretch 5 breaths   Before and after workouts

## 2022-06-01 ENCOUNTER — Ambulatory Visit: Payer: No Typology Code available for payment source | Attending: Certified Nurse Midwife | Admitting: Physical Therapy

## 2022-06-01 DIAGNOSIS — R2689 Other abnormalities of gait and mobility: Secondary | ICD-10-CM | POA: Diagnosis present

## 2022-06-01 DIAGNOSIS — M62838 Other muscle spasm: Secondary | ICD-10-CM | POA: Diagnosis present

## 2022-06-01 DIAGNOSIS — R279 Unspecified lack of coordination: Secondary | ICD-10-CM | POA: Insufficient documentation

## 2022-06-01 DIAGNOSIS — M533 Sacrococcygeal disorders, not elsewhere classified: Secondary | ICD-10-CM | POA: Insufficient documentation

## 2022-06-01 NOTE — Therapy (Signed)
[place] MAIN [facility] SERVICES [address] [place], [place], [address] Phone: [phone]   Fax:  [phone]  Physical Therapy Treatment  Patient Details  Name: Brandi Johnson MRN: [id number] Date of Birth: June 01, 1986 Referring Provider (PT): Silvana Newness CNM   Encounter Date: 06/01/2022   PT End of Session - 06/01/22 1014     Visit Number 3    Number of Visits 10    Date for PT Re-Evaluation 07/20/22    PT Start Time 1005    PT Stop Time 1100    PT Time Calculation (min) 55 min    Activity Tolerance Patient tolerated treatment well    Behavior During Therapy [facility] for tasks assessed/performed             Past Medical History:  Diagnosis Date   Acute bronchitis due to COVID-19 virus 10/17/2020   AR (allergic rhinitis)    Hemorrhoids in pregnancy    History of renal calculi    with hydroneprosis noted during pregnancy   Hydronephrosis    Motion sickness    UTI (lower urinary tract infection)     Past Surgical History:  Procedure Laterality Date   CESAREAN SECTION  2012    There were no vitals filed for this visit.   Subjective Assessment - 06/01/22 1012     Subjective Pt reported no more groin pain for one month., LBP is much better and she only noticed a smallpain/ ache in the R deep glut and sometimes when bending, she feels the tightness at the sacrum.  Pt would like to do a yoga session once week once a week.  Pt is able to sleep without pain the past 2 weeks.    Pertinent History C -section x 2    Patient Stated Goals be able to sleep and not be in pain                [facility] PT Assessment - 06/01/22 1043       Other:   Other/Comments squats: anterior COM w/ poor alignment, RDL required cues,      Palpation   SI assessment  L SIJ limited L FADDIR    Palpation comment hypomobile midfoot joints L, limited in DF/EV / toe abduction , tightness along lateral leg                            [facility] Adult PT Treatment/Exercise - 06/01/22 1232       Therapeutic Activites    Other Therapeutic Activities explained fitness principles and modifcations for kettle bell swings , swings, and RDL      Neuro Re-ed    Neuro Re-ed Details  cued for lower kinetic chain alignment in fitness workout exercises, squats,      Manual Therapy   Manual therapy comments STM/MW along L lateral leg  to promote ER/ less adducted knees                          PT Long Term Goals - 06/01/22 1236       PT LONG TERM GOAL #1   Title Pt will demo equal alignment of pelvic girdle and report sleeping on L side with pillow between knees and report sleeping through the night without pain    Baseline pain on L sidelying    Time 6    Period Weeks    Status  On-going    Target Date 06/22/22      PT LONG TERM GOAL #2   Title Pt will be able to experience no SIJ pain when on the floor playing with duaghter and son, wrapping presents on the floor    Baseline pain    Time 8    Period Weeks    Status On-going    Target Date 07/06/22      PT LONG TERM GOAL #3   Title Pt will demo more armswing in her gait and less excessive movement in her L/S area in order to demo increased stability to continue her walking routine    Time 8    Period Weeks    Status On-going    Target Date 07/06/22      PT LONG TERM GOAL #4   Title Pt will improve her FOTO score by > 5 pts in order to improve QOL    Baseline 54 pts    Time 10    Period Weeks    Status On-going    Target Date 07/20/22                   Plan - 06/01/22 1014     Clinical Impression Statement Pt's improvements include being able to sleep without pain the past 2 weeks.   Pt showed levelled pelvic girdle and more mobility at R lower kinetic chain but still had tightness along L LE, ankle, leg, L SIJ. Pt benefited from manual Tx and showed improved mobility post Tx.   Progressed pt to alignment and propioception training today  to fitness exercises and explained modifications to minimize overactivity of pelvic floor and tightness of SIJ/ deficits at feet/ ankle.  Plan to provide training with yoga and modify her routine at next session to help pt achieve her goals. Pt continues to benefit from skilled PT.     Personal Factors and Comorbidities Fitness    Examination-Activity Limitations Locomotion Level;Sit;Stand    Stability/Clinical Decision Making Evolving/Moderate complexity    Rehab Potential Good    PT Frequency 1x / week    PT Duration Other (comment)   10   PT Treatment/Interventions ADLs/Self Care Home Management;Moist Heat;Therapeutic activities;Therapeutic exercise;Patient/family education;Neuromuscular re-education;Manual techniques;Scar mobilization;Spinal Manipulations;Taping;Functional mobility training;Balance training;Stair training;Gait training;Vestibular;Joint Manipulations;Manual lymph drainage;Traction;Energy conservation             Patient will benefit from skilled therapeutic intervention in order to improve the following deficits and impairments:  Decreased endurance, Decreased safety awareness, Increased muscle spasms, Decreased coordination, Abnormal gait, Decreased balance, Decreased activity tolerance, Decreased mobility, Decreased strength, Impaired sensation, Decreased scar mobility, Pain, Improper body mechanics, Hypomobility, Difficulty walking, Decreased range of motion, Increased fascial restricitons, Hypermobility  Visit Diagnosis: Sacrococcygeal disorders, not elsewhere classified  Other abnormalities of gait and mobility  Unspecified lack of coordination  Other muscle spasm     Problem List Patient Active Problem List   Diagnosis Date Noted   Herpes zoster 0000000   Focal non-cyclical breast pain [id number]   Iron deficiency 05/20/2021   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

## 2022-06-01 NOTE — Patient Instructions (Addendum)
    Heel raises - heels together, minisquat  Minisquat motion, trunk bent , gaze onto floor like you are looking at your reflection over a lake/pond,  Knees bent pointed out like a "v" , navel ( center of mass) more forward  Heels together as you lift, pointed out like a "v"  KNEES ARE ALIGNED BEHIND THE TOES TO MINIMIZE STRAIN ON THE KNEES your  navel ( center of mass) more forward to a avoid dropping down fast and rocking more weight back onto heels , keep heels pressing against each other the whole time   10 reps  ________________    Letter T, [place] on one leg with band band under L foot, wrap by big toe then, outer knee/ thigh, L hand pulling ( elbow by side)  Plant ballmound, toes spread, thigh out against band,, tracking knee in line with 2-3rd toe line Dipping forward, R foot lifts slight ( whole body like a see saw) off floor or  Press back foot against wall 20 reps  Both    ___________________  Brandi Johnson swings:  Cautious with the hip trusts due to overtightening gluts   Keep knees slightly bent,  Weight close to navel   Think function ___  Feet care :  Self -feet massage   Handshake : fingers between toes, moving ballmounds/toes back and forth several times while other hand anchors at arch. Do the same at the hind/mid foot.  Heel to toes upward to a letter Big Letter T strokes to spread ballmounds and toes, several times, pinch between webs of toes  Run finger tips along top of foot between long bones "comb between the bones"    Wiggle toes and spread them out when relaxing     _________  Feet slides :   Points of contact at sitting bones  Four points of contact of foot,  Side knee back while keeping knee out along 2-3rd toe line   Heel up, ankle not twist out Lower heel while keeping knee out along 2-3rd toe line Four points of contact of foot, Slide foot back while keeping knee out along 2-3rd toe line   Repeated with other foot

## 2022-06-13 ENCOUNTER — Ambulatory Visit (INDEPENDENT_AMBULATORY_CARE_PROVIDER_SITE_OTHER): Payer: No Typology Code available for payment source | Admitting: Internal Medicine

## 2022-06-13 ENCOUNTER — Encounter: Payer: Self-pay | Admitting: Internal Medicine

## 2022-06-13 ENCOUNTER — Other Ambulatory Visit: Payer: Self-pay

## 2022-06-13 VITALS — BP 104/70 | HR 83 | Temp 98.3°F | Resp 14 | Ht 61.5 in | Wt 137.4 lb

## 2022-06-13 DIAGNOSIS — B029 Zoster without complications: Secondary | ICD-10-CM

## 2022-06-13 DIAGNOSIS — B009 Herpesviral infection, unspecified: Secondary | ICD-10-CM

## 2022-06-13 DIAGNOSIS — L298 Other pruritus: Secondary | ICD-10-CM | POA: Diagnosis not present

## 2022-06-13 DIAGNOSIS — J3489 Other specified disorders of nose and nasal sinuses: Secondary | ICD-10-CM | POA: Diagnosis not present

## 2022-06-13 MED ORDER — DOXYCYCLINE HYCLATE 100 MG PO TABS
100.0000 mg | ORAL_TABLET | Freq: Two times a day (BID) | ORAL | 0 refills | Status: DC
  Filled 2022-06-13: qty 14, 7d supply, fill #0

## 2022-06-13 MED ORDER — MUPIROCIN 2 % EX OINT
1.0000 "application " | TOPICAL_OINTMENT | Freq: Two times a day (BID) | CUTANEOUS | 0 refills | Status: DC
  Filled 2022-06-13: qty 22, 11d supply, fill #0

## 2022-06-13 MED ORDER — VALACYCLOVIR HCL 1 G PO TABS
1000.0000 mg | ORAL_TABLET | Freq: Two times a day (BID) | ORAL | 3 refills | Status: DC
  Filled 2022-06-13: qty 90, 45d supply, fill #0
  Filled 2023-05-20: qty 90, 45d supply, fill #1

## 2022-06-13 NOTE — Patient Instructions (Signed)
L lysine 500 mg max dose 3000 mg/day  I would say try 1000 mg daily

## 2022-06-13 NOTE — Progress Notes (Signed)
Chief Complaint  Patient presents with   Herpes Zoster    Pt c/o poss shingles, had an outbreak in her nose 1-2 days ago, the week of May 29th inside of nose was itching. Has taken gabapentin & antihistamine w/o relief. Hx of shingles   05/28/22 inside of left inner nose itching tried gabapentin before helped with itching 1-2 nights otc antihistamines  Itching lasted x 1 week  Inside of nose inside and blowing nose there is sore at times use bactroban and vasoline sore waxes and wanes  1-2 days bump on outside of left nose oozing clear and tip of nose blister oozing burning pain      Review of Systems  Constitutional:  Negative for weight loss.  HENT:  Negative for hearing loss.   Eyes:  Negative for blurred vision.  Respiratory:  Negative for shortness of breath.   Cardiovascular:  Negative for chest pain.  Gastrointestinal:  Negative for abdominal pain and blood in stool.  Genitourinary:  Negative for dysuria.  Musculoskeletal:  Negative for falls and joint pain.  Skin:  Negative for rash.  Neurological:  Negative for headaches.  Psychiatric/Behavioral:  Negative for depression.    Past Medical History:  Diagnosis Date   Acute bronchitis due to COVID-19 virus 10/17/2020   AR (allergic rhinitis)    Hemorrhoids in pregnancy    History of renal calculi    with hydroneprosis noted during pregnancy   Hydronephrosis    Motion sickness    UTI (lower urinary tract infection)    Past Surgical History:  Procedure Laterality Date   CESAREAN SECTION  2012   Family History  Problem Relation Age of Onset   Mental illness Mother    Colitis Mother        collagenous   Colon polyps Mother    Heart disease Father 8       CAD s/p CABG   Diabetes Father    Skin cancer Father    Diabetes Maternal Grandmother    Hyperlipidemia Maternal Grandmother    Stroke Maternal Grandmother    Stroke Paternal Grandmother    Celiac disease Sister    Cancer Neg Hx    Social History    Socioeconomic History   Marital status: Married    Spouse name: Not on file   Number of children: Not on file   Years of education: Not on file   Highest education level: Not on file  Occupational History   Not on file  Tobacco Use   Smoking status: Never   Smokeless tobacco: Never  Vaping Use   Vaping Use: Never used  Substance and Sexual Activity   Alcohol use: No   Drug use: No   Sexual activity: Yes    Birth control/protection: I.U.D.    Comment: Mirena  Other Topics Concern   Not on file  Social History Narrative   Not on file   Social Determinants of Health   Financial Resource Strain: Not on file  Food Insecurity: Not on file  Transportation Needs: Not on file  Physical Activity: Not on file  Stress: Not on file  Social Connections: Not on file  Intimate Partner Violence: Not on file   Current Meds  Medication Sig   azelastine (ASTELIN) 0.1 % nasal spray Place 2 sprays into both nostrils 2 (two) times daily.   cetirizine (ZYRTEC) 10 MG tablet Take 1 tablet (10 mg total) daily by mouth.   Cholecalciferol (VITAMIN D3) 1.25 MG (50000 UT) TABS Take  by mouth.   doxycycline (VIBRA-TABS) 100 MG tablet Take 1 tablet (100 mg total) by mouth 2 (two) times daily. With food   ferrous sulfate 325 (65 FE) MG tablet TAKE 1 TABLET BY MOUTH TWICE DAILY WITH FOOD   gabapentin (NEURONTIN) 300 MG capsule Take 1 capsule (300 mg total) by mouth 2 (two) times daily as needed.   levonorgestrel (MIRENA) 20 MCG/24HR IUD 1 each by Intrauterine route once.   meclizine (ANTIVERT) 25 MG tablet Take 25 mg by mouth daily as needed.   Omega-3 Fatty Acids (FISH OIL) 1000 MG CAPS Take by mouth.   Zinc 50 MG TABS Take by mouth daily in the afternoon.   [DISCONTINUED] mupirocin ointment (BACTROBAN) 2 % Apply 1 application. topically 2 (two) times daily. X 5 days   Allergies  Allergen Reactions   Sulfa Antibiotics Rash   Recent Results (from the past 2160 hour(s))  Comprehensive metabolic  panel     Status: None   Collection Time: 03/23/22  9:18 AM  Result Value Ref Range   Sodium 140 135 - 145 mEq/L   Potassium 4.1 3.5 - 5.1 mEq/L   Chloride 109 96 - 112 mEq/L   CO2 26 19 - 32 mEq/L   Glucose, Bld 82 70 - 99 mg/dL   BUN 17 6 - 23 mg/dL   Creatinine, Ser 9.39 0.40 - 1.20 mg/dL   Total Bilirubin 0.4 0.2 - 1.2 mg/dL   Alkaline Phosphatase 46 39 - 117 U/L   AST 21 0 - 37 U/L   ALT 14 0 - 35 U/L   Total Protein 6.6 6.0 - 8.3 g/dL   Albumin 4.8 3.5 - 5.2 g/dL   GFR 030.09 >23.30 mL/min    Comment: Calculated using the CKD-EPI Creatinine Equation (2021)   Calcium 8.9 8.4 - 10.5 mg/dL  CBC w/Diff     Status: None   Collection Time: 03/23/22  9:18 AM  Result Value Ref Range   WBC 4.9 4.0 - 10.5 K/uL   RBC 4.68 3.87 - 5.11 Mil/uL   Hemoglobin 13.8 12.0 - 15.0 g/dL   HCT 07.6 22.6 - 33.3 %   MCV 85.9 78.0 - 100.0 fl   MCHC 34.2 30.0 - 36.0 g/dL   RDW 54.5 62.5 - 63.8 %   Platelets 183.0 150.0 - 400.0 K/uL   Neutrophils Relative % 72.5 43.0 - 77.0 %   Lymphocytes Relative 16.9 12.0 - 46.0 %   Monocytes Relative 7.9 3.0 - 12.0 %   Eosinophils Relative 2.3 0.0 - 5.0 %   Basophils Relative 0.4 0.0 - 3.0 %   Neutro Abs 3.6 1.4 - 7.7 K/uL   Lymphs Abs 0.8 0.7 - 4.0 K/uL   Monocytes Absolute 0.4 0.1 - 1.0 K/uL   Eosinophils Absolute 0.1 0.0 - 0.7 K/uL   Basophils Absolute 0.0 0.0 - 0.1 K/uL  TSH     Status: None   Collection Time: 03/23/22  9:18 AM  Result Value Ref Range   TSH 1.63 0.35 - 5.50 uIU/mL  Vitamin D (25 hydroxy)     Status: None   Collection Time: 03/23/22  9:18 AM  Result Value Ref Range   VITD 32.69 30.00 - 100.00 ng/mL  Vitamin B12     Status: None   Collection Time: 03/23/22  9:18 AM  Result Value Ref Range   Vitamin B-12 294 211 - 911 pg/mL  Hepatitis C antibody     Status: None   Collection Time: 03/23/22  9:18 AM  Result Value Ref Range   Hepatitis C Ab NON-REACTIVE NON-REACTIVE   SIGNAL TO CUT-OFF 0.08 <1.00    Comment: . HCV antibody was  non-reactive. There is no laboratory  evidence of HCV infection. . In most cases, no further action is required. However, if recent HCV exposure is suspected, a test for HCV RNA (test code 45409) is suggested. . For additional information please refer to http://education.[facility].com/faq/FAQ22v1 (This link is being provided for informational/ educational purposes only.) .   HIV antibody (with reflex)     Status: None   Collection Time: 03/23/22  9:18 AM  Result Value Ref Range   HIV 1&2 Ab, 4th Generation NON-REACTIVE NON-REACTIVE    Comment: HIV-1 antigen and HIV-1/HIV-2 antibodies were not detected. There is no laboratory evidence of HIV infection. Marland Kitchen PLEASE NOTE: This information has been disclosed to you from records whose confidentiality may be protected by state law.  If your state requires such protection, then the state law prohibits you from making any further disclosure of the information without the specific written consent of the person to whom it pertains, or as otherwise permitted by law. A general authorization for the release of medical or other information is NOT sufficient for this purpose. . For additional information please refer to http://education.[facility].com/faq/FAQ106 (This link is being provided for informational/ educational purposes only.) . Marland Kitchen The performance of this assay has not been clinically validated in patients less than 13 years old. Marland Kitchen   Urinalysis, Routine w reflex microscopic     Status: Abnormal   Collection Time: 03/23/22  9:18 AM  Result Value Ref Range   Color, Urine YELLOW Yellow;Lt. Yellow;Straw;Dark Yellow;Amber;Green;Red;Brown   APPearance CLEAR Clear;Turbid;Slightly Cloudy;Cloudy   Specific Gravity, Urine <=1.005 (A) 1.000 - 1.030   pH 5.5 5.0 - 8.0   Total Protein, Urine NEGATIVE Negative   Urine Glucose NEGATIVE Negative   Ketones, ur NEGATIVE Negative   Bilirubin Urine NEGATIVE Negative   Hgb urine  dipstick NEGATIVE Negative   Urobilinogen, UA 0.2 0.0 - 1.0   Leukocytes,Ua NEGATIVE Negative   Nitrite NEGATIVE Negative   WBC, UA 0-2/hpf 0-2/hpf   Squamous Epithelial / LPF Rare(0-4/hpf) Rare(0-4/hpf)   Bacteria, UA Few(10-50/hpf) (A) None  IBC + Ferritin     Status: Abnormal   Collection Time: 03/23/22  9:18 AM  Result Value Ref Range   Iron 47 42 - 145 ug/dL   Transferrin 811.9 147.8 - 360.0 mg/dL   Saturation Ratios 29.5 (L) 20.0 - 50.0 %   Ferritin 91.8 10.0 - 291.0 ng/mL   TIBC 397.6 250.0 - 450.0 mcg/dL  RPR     Status: None   Collection Time: 03/23/22  9:18 AM  Result Value Ref Range   RPR Ser Ql NON-REACTIVE NON-REACTIVE  Hepatitis B surface antibody,quantitative     Status: None   Collection Time: 03/23/22  9:18 AM  Result Value Ref Range   Hepatitis B-Post 16 > OR = 10 mIU/mL    Comment: . Patient has immunity to hepatitis B virus. . For additional information, please refer to http://education.[facility].com/faq/FAQ105 (This link is being provided for informational/ educational purposes only).   Hepatitis B Core Antibody, total     Status: None   Collection Time: 03/23/22  9:18 AM  Result Value Ref Range   Hep B Core Total Ab NON-REACTIVE NON-REACTIVE  Hepatitis B Surface AntiGEN     Status: None   Collection Time: 03/23/22  9:18 AM  Result Value Ref Range   Hepatitis B Surface  Ag NON-REACTIVE NON-REACTIVE   Objective  Body mass index is 25.54 kg/m. Wt Readings from Last 3 Encounters:  06/13/22 137 lb 6.4 oz (62.3 kg)  04/10/22 136 lb (61.7 kg)  03/23/22 138 lb 3.2 oz (62.7 kg)   Temp Readings from Last 3 Encounters:  06/13/22 98.3 F (36.8 C) (Oral)  03/23/22 98 F (36.7 C) (Oral)  05/17/21 98.1 F (36.7 C)   BP Readings from Last 3 Encounters:  06/13/22 104/70  03/23/22 110/80  12/04/21 108/68   Pulse Readings from Last 3 Encounters:  06/13/22 83  03/23/22 91  12/04/21 84    Physical Exam Vitals and nursing note reviewed.   Constitutional:      Appearance: Normal appearance. She is well-developed and well-groomed.  HENT:     Head: Normocephalic and atraumatic.   Eyes:     Conjunctiva/sclera: Conjunctivae normal.     Pupils: Pupils are equal, round, and reactive to light.  Cardiovascular:     Rate and Rhythm: Normal rate and regular rhythm.     Heart sounds: Normal heart sounds. No murmur heard. Pulmonary:     Effort: Pulmonary effort is normal.     Breath sounds: Normal breath sounds.  Abdominal:     General: Abdomen is flat. Bowel sounds are normal.     Tenderness: There is no abdominal tenderness.  Musculoskeletal:        General: No tenderness.  Skin:    General: Skin is warm and dry.  Neurological:     General: No focal deficit present.     Mental Status: She is alert and oriented to person, place, and time. Mental status is at baseline.     Cranial Nerves: Cranial nerves 2-12 are intact.     Motor: Motor function is intact.     Coordination: Coordination is intact.     Gait: Gait is intact.  Psychiatric:        Attention and Perception: Attention and perception normal.        Mood and Affect: Mood and affect normal.        Speech: Speech normal.        Behavior: Behavior normal. Behavior is cooperative.        Thought Content: Thought content normal.        Cognition and Memory: Cognition and memory normal.        Judgment: Judgment normal.     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 French Anaracy McLean-[facility]oc[facility]

## 2022-06-14 LAB — HSV(HERPES SIMPLEX VRS) I + II AB-IGG
HAV 1 IGG,TYPE SPECIFIC AB: <0.9 index
HSV 2 IGG,TYPE SPECIFIC AB: <0.9 index

## 2022-06-20 ENCOUNTER — Ambulatory Visit: Payer: No Typology Code available for payment source | Admitting: Physical Therapy

## 2022-07-13 ENCOUNTER — Encounter: Payer: Self-pay | Admitting: Internal Medicine

## 2022-07-13 ENCOUNTER — Ambulatory Visit: Payer: No Typology Code available for payment source | Attending: Internal Medicine | Admitting: Physical Therapy

## 2022-07-13 ENCOUNTER — Ambulatory Visit (INDEPENDENT_AMBULATORY_CARE_PROVIDER_SITE_OTHER): Payer: No Typology Code available for payment source | Admitting: Internal Medicine

## 2022-07-13 DIAGNOSIS — R2689 Other abnormalities of gait and mobility: Secondary | ICD-10-CM | POA: Diagnosis present

## 2022-07-13 DIAGNOSIS — M62838 Other muscle spasm: Secondary | ICD-10-CM | POA: Diagnosis present

## 2022-07-13 DIAGNOSIS — R279 Unspecified lack of coordination: Secondary | ICD-10-CM | POA: Diagnosis present

## 2022-07-13 DIAGNOSIS — B029 Zoster without complications: Secondary | ICD-10-CM | POA: Diagnosis not present

## 2022-07-13 DIAGNOSIS — M533 Sacrococcygeal disorders, not elsewhere classified: Secondary | ICD-10-CM | POA: Insufficient documentation

## 2022-07-13 MED ORDER — ZOSTER VAC RECOMB ADJUVANTED 50 MCG/0.5ML IM SUSR: 0.5000 mL | Freq: Once | INTRAMUSCULAR | 1 refills | Status: AC

## 2022-07-13 NOTE — Patient Instructions (Signed)
Yoga:  Warrior position:  Railroad track back, heel , down turn toes out 30 deg , feet is still hip width   Pelvis square to front   Running lunges , use blocks   "Flat back", keep knees bents   Elbows by side, not fingers not turned in  Downdog:  Palm flat far knuckles arched, ballmound flat  Hands shoulder width apart  Shoulders down  __  Deep core 2 x day   Handout  Try not to suck belly in or tuck under __  Happy baby rocking:  Scoot hips to R first then rock and then scoot to L then rock

## 2022-07-13 NOTE — Assessment & Plan Note (Signed)
Recurrent.  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  HSV screening is negative.  Recommending Shingrix vaccine series; if not covered by insurance,  I have offered suppressive therapy with Valtrex

## 2022-07-13 NOTE — Progress Notes (Signed)
Subjective:  Patient ID: Brandi Johnson, female    DOB: 08/31/1986  Age: 36 y.o. MRN: [id number]  CC: The encounter diagnosis was Varicella zoster.   HPI Brandi Johnson presents for  Chief Complaint  Patient presents with   Follow-up    3 month follow up    2 episodes of shingles  in the past 6 months occurring on  the left forehead involving the tip of the nose  has classic blistering but last episode was one papule.   , last episode occurred end of May with nasal itching .  HSV screening negative . Has been taking lysine since June outbreak.   Nasal crusting:   recurrent, but resolved with use of mupirocin.   Outpatient Medications Prior to Visit  Medication Sig Dispense Refill   azelastine (ASTELIN) 0.1 % nasal spray Place 2 sprays into both nostrils 2 (two) times daily. 30 mL 11   cetirizine (ZYRTEC) 10 MG tablet Take 1 tablet (10 mg total) daily by mouth. 90 tablet 4   Cholecalciferol (VITAMIN D3) 1.25 MG (50000 UT) TABS Take by mouth.     ferrous sulfate 325 (65 FE) MG tablet TAKE 1 TABLET BY MOUTH TWICE DAILY WITH FOOD 60 tablet 3   levonorgestrel (MIRENA) 20 MCG/24HR IUD 1 each by Intrauterine route once.     Lysine 500 MG CAPS Take 1 capsule by mouth daily.     meclizine (ANTIVERT) 25 MG tablet Take 25 mg by mouth daily as needed.     mupirocin ointment (BACTROBAN) 2 % Apply 1 application  topically 2 (two) times daily. X 5 days 22 g 0   valACYclovir (VALTREX) 1000 MG tablet Take 1 tablet (1,000 mg total) by mouth 2 (two) times daily. X 5-7 prn 90 tablet 3   Zinc 50 MG TABS Take by mouth daily in the afternoon.     Biotin 10 MG CAPS Take 10 mg by mouth daily. (Patient not taking: Reported on 06/13/2022)     doxycycline (VIBRA-TABS) 100 MG tablet Take 1 tablet (100 mg total) by mouth 2 (two) times daily. With food (Patient not taking: Reported on 07/13/2022) 14 tablet 0   gabapentin (NEURONTIN) 300 MG capsule Take 1 capsule (300 mg total) by mouth 2 (two) times daily as  needed. (Patient not taking: Reported on 07/13/2022) 20 capsule 0   Omega-3 Fatty Acids (FISH OIL) 1000 MG CAPS Take by mouth. (Patient not taking: Reported on 07/13/2022)     No facility-administered medications prior to visit.    Review of Systems;  Patient denies headache, fevers, malaise, unintentional weight loss, skin rash, eye pain, sinus congestion and sinus pain, sore throat, dysphagia,  hemoptysis , cough, dyspnea, wheezing, chest pain, palpitations, orthopnea, edema, abdominal pain, nausea, melena, diarrhea, constipation, flank pain, dysuria, hematuria, urinary  Frequency, nocturia, numbness, tingling, seizures,  Focal weakness, Loss of consciousness,  Tremor, insomnia, depression, anxiety, and suicidal ideation.      Objective:  BP 100/72 (BP Location: Left Arm, Patient Position: Sitting, Cuff Size: Normal)   Pulse 85   Temp 98 F (36.7 C) (Oral)   Ht (!) 9' (2.743 m)   Wt 137 lb 12.8 oz (62.5 kg)   HC 61.5" (156.2 cm)   SpO2 99%   BMI 8.31 kg/m   BP Readings from Last 3 Encounters:  07/13/22 100/72  06/13/22 104/70  03/23/22 110/80    Wt Readings from Last 3 Encounters:  07/13/22 137 lb 12.8 oz (62.5 kg)  06/13/22 137  lb 6.4 oz (62.3 kg)  04/10/22 136 lb (61.7 kg)    General appearance: alert, cooperative and appears stated age Ears: normal TM's and external ear canals both ears Throat: lips, mucosa, and tongue normal; teeth and gums normal Neck: no adenopathy, no carotid bruit, supple, symmetrical, trachea midline and thyroid not enlarged, symmetric, no tenderness/mass/nodules Back: symmetric, no curvature. ROM normal. No CVA tenderness. Lungs: clear to auscultation bilaterally Heart: regular rate and rhythm, S1, S2 normal, no murmur, click, rub or gallop Abdomen: soft, non-tender; bowel sounds normal; no masses,  no organomegaly Pulses: 2+ and symmetric Skin: Skin color, texture, turgor normal. No rashes or lesions Lymph nodes: Cervical, supraclavicular, and  axillary nodes normal.  Lab Results  Component Value Date   HGBA1C 5.1 10/28/2015    Lab Results  Component Value Date   CREATININE 0.68 03/23/2022   CREATININE 0.66 11/18/2018   CREATININE 0.76 11/06/2017    Lab Results  Component Value Date   WBC 4.9 03/23/2022   HGB 13.8 03/23/2022   HCT 40.2 03/23/2022   PLT 183.0 03/23/2022   GLUCOSE 82 03/23/2022   CHOL 179 11/29/2020   TRIG 101 11/29/2020   HDL 51 11/29/2020   LDLCALC 110 (H) 11/29/2020   ALT 14 03/23/2022   AST 21 03/23/2022   NA 140 03/23/2022   K 4.1 03/23/2022   CL 109 03/23/2022   CREATININE 0.68 03/23/2022   BUN 17 03/23/2022   CO2 26 03/23/2022   TSH 1.63 03/23/2022   HGBA1C 5.1 10/28/2015    MM DIAG BREAST TOMO BILATERAL  Result Date: 05/22/2021 CLINICAL DATA:  36 year old female presenting for evaluation of 2 week history of nonfocal right-sided breast pain in the lateral right breast. She has not experienced pain in the last 3-4 days. EXAM: DIGITAL DIAGNOSTIC BILATERAL MAMMOGRAM WITH TOMOSYNTHESIS AND CAD TECHNIQUE: Bilateral digital diagnostic mammography and breast tomosynthesis was performed. The images were evaluated with computer-aided detection. COMPARISON:  None. ACR Breast Density Category c: The breast tissue is heterogeneously dense, which may obscure small masses. FINDINGS: No suspicious calcifications, masses or areas of distortion are seen in the bilateral breasts. IMPRESSION: 1. No findings in the right breast to correspond with the patient's nonfocal lateral breast pain. 2.  No mammographic evidence of malignancy in the bilateral breasts. RECOMMENDATION: 1. Clinical follow-up recommended for the tender area of concern in the right breast. Any further workup should be based on clinical grounds. 2. Screening mammogram at age 68 unless there are persistent or intervening clinical concerns. (Code:SM-B-40A) I have discussed the findings and recommendations with the patient. If applicable, a reminder  letter will be sent to the patient regarding the next appointment. BI-RADS CATEGORY  1: Negative. Electronically Signed   By: Ammie Ferrier M.D.   On: 05/22/2021 10:03    Assessment & Plan:   Problem List Items Addressed This Visit     Varicella zoster    Recurrent.  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  HSV screening is negative.  Recommending Shingrix vaccine series; if not covered by insurance,  I have offered suppressive therapy with Valtrex       Relevant Medications   Zoster Vaccine Adjuvanted [facility]) injection    I spent a total of   minutes with this patient in a face to face visit on the date of this encounter reviewing the last office visit with me on        ,  most recent with patient's  cardiologist in    ,  patient'ss diet and eating habits, home blood pressure readings ,  most recent imaging study ,   and post visit ordering of testing and therapeutics.    Follow-up: No follow-ups on file.   Sherlene Shams, MD

## 2022-07-13 NOTE — Patient Instructions (Signed)
I recommend getting the new Shingles vaccine 2 host series ( called "Shingrix") but I can't guarantee your insurance will pay for it.  If they won't,  and you would like to start suppressive therapy,  I can put you on Valtrex   Keep using the mupirocine ointment once daily at night to moisturize nasal septum

## 2022-07-13 NOTE — Therapy (Signed)
[facility] MAIN [facility] SERVICES [address] [place], [place], [address] Phone: [phone]   Fax:  [phone]  Physical Therapy Treatment / Discharge Summary across 4 visits   Patient Details  Name: Brandi Johnson MRN: [id number] Date of Birth: 1986-10-25 Referring Provider (PT): Leola Brazil CNM   Encounter Date: 07/13/2022   PT End of Session - 07/13/22 1007     Visit Number 4    Number of Visits 10    Date for PT Re-Evaluation 07/20/22    PT Start Time 1007    PT Stop Time 1050    PT Time Calculation (min) 43 min    Activity Tolerance Patient tolerated treatment well    Behavior During Therapy [facility] for tasks assessed/performed             Past Medical History:  Diagnosis Date   Acute bronchitis due to COVID-19 virus 10/17/2020   AR (allergic rhinitis)    Hemorrhoids in pregnancy    History of renal calculi    with hydroneprosis noted during pregnancy   Hydronephrosis    Motion sickness    UTI (lower urinary tract infection)     Past Surgical History:  Procedure Laterality Date   CESAREAN SECTION  2012    There were no vitals filed for this visit.   Subjective Assessment - 07/13/22 1042     Subjective Pt had not had R groin pain nor LBP for the past months. Pt feels discomfort R SIJ when rocking side to side with happy baby pose.                [facility] PT Assessment - 07/13/22 1042       Observation/Other Assessments   Observations yoga poses with poor alignment for SIJ and      Coordination   Coordination and Movement Description straining with ab with deep core      Strength   Overall Strength Comments DF/EV B 5/5                           [facility] Adult PT Treatment/Exercise - 07/13/22 1051       Therapeutic Activites    Other Therapeutic Activities cued for proper alignment for SIJ and pelvic girdle stability in yoga poses, use of blocks to minimzie upper trap overuse and  modifications to minimzie upper trap overuse      Neuro Re-ed    Neuro Re-ed Details  cued for deep core level 1 and 2 , less ab straining                          PT Long Term Goals - 06/01/22 1236       PT LONG TERM GOAL #1   Title Pt will demo equal alignment of pelvic girdle and report sleeping on L side with pillow between knees and report sleeping through the night without pain    Baseline pain on L sidelying    Time 6    Period Weeks    Status achieved   Target Date 06/22/22      PT LONG TERM GOAL #2   Title Pt will be able to experience no SIJ pain when on the floor playing with duaghter and son, wrapping presents on the floor    Baseline pain    Time 8    Period Weeks    Status achieved  Target Date 07/06/22      PT LONG TERM GOAL #3   Title Pt will demo more armswing in her gait and less excessive movement in her L/S area in order to demo increased stability to continue her walking routine    Time 8    Period Weeks    Status achieved   Target Date 07/06/22      PT LONG TERM GOAL #4   Title Pt will improve her FOTO score by > 5 pts in order to improve QOL    Baseline 54 pts   ---> 94 pts ( 40 pts change)    Time 10    Period Weeks    Status achieved   Target Date 07/20/22                   Plan - 07/13/22 1054     Clinical Impression Statement  Pt had not had R groin pain nor LBP for the past months. PT is able to play on the floor with her children without pain.  Pt has achieved 100% of her goals. Foto score for LBP has doubled, indicating increased function.   Pt's leg length difference has been addressed and the associated deficits have been corrected. Pt was educated and demonstrated alignment techniques with her yoga routine to minimize risk for injuries after training today. Pt also demo'd proper coordination with deep core HEP with less abdominal mm dominance after training.   Pt is ready for d/c at this time.      Personal Factors and Comorbidities Fitness    Examination-Activity Limitations Locomotion Level;Sit;Stand    Stability/Clinical Decision Making Evolving/Moderate complexity    Rehab Potential Good    PT Frequency 1x / week    PT Duration Other (comment)   10   PT Treatment/Interventions ADLs/Self Care Home Management;Moist Heat;Therapeutic activities;Therapeutic exercise;Patient/family education;Neuromuscular re-education;Manual techniques;Scar mobilization;Spinal Manipulations;Taping;Functional mobility training;Balance training;Stair training;Gait training;Vestibular;Joint Manipulations;Manual lymph drainage;Traction;Energy conservation             Patient will benefit from skilled therapeutic intervention in order to improve the following deficits and impairments:  Decreased endurance, Decreased safety awareness, Increased muscle spasms, Decreased coordination, Abnormal gait, Decreased balance, Decreased activity tolerance, Decreased mobility, Decreased strength, Impaired sensation, Decreased scar mobility, Pain, Improper body mechanics, Hypomobility, Difficulty walking, Decreased range of motion, Increased fascial restricitons, Hypermobility  Visit Diagnosis: Sacrococcygeal disorders, not elsewhere classified  Other abnormalities of gait and mobility  Unspecified lack of coordination  Other muscle spasm     Problem List Patient Active Problem List   Diagnosis Date Noted   Varicella zoster 04/10/2022   Focal non-cyclical breast pain 05/20/2021   Iron deficiency 05/20/2021   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 SERVICES [address] [place], [place], [address] Phone: [phone]   Fax:  [phone]  Name: Brandi Johnson MRN: [id number] Date of Birth: [birth year]

## 2022-07-16 ENCOUNTER — Other Ambulatory Visit: Payer: Self-pay

## 2022-07-16 MED ORDER — ZOSTER VAC RECOMB ADJUVANTED 50 MCG/0.5ML IM SUSR
INTRAMUSCULAR | 0 refills | Status: DC
  Filled 2022-07-16: qty 1, 1d supply, fill #0

## 2022-07-18 ENCOUNTER — Ambulatory Visit: Payer: No Typology Code available for payment source | Admitting: Physical Therapy

## 2022-07-24 ENCOUNTER — Other Ambulatory Visit: Payer: Self-pay

## 2022-07-27 ENCOUNTER — Encounter: Payer: No Typology Code available for payment source | Admitting: Physical Therapy

## 2022-08-03 ENCOUNTER — Telehealth: Payer: No Typology Code available for payment source | Admitting: Internal Medicine

## 2022-08-05 IMAGING — MG DIGITAL DIAGNOSTIC BILAT W/ TOMO W/ CAD
6 of 10 series · 6 of 30 positions shown · non-contrast
Comparison: None.

CLINICAL DATA: 34-year-old female presenting for evaluation of 2
week history of nonfocal right-sided breast pain in the lateral
right breast. She has not experienced pain in the last 3-4 days.

EXAM:
DIGITAL DIAGNOSTIC BILATERAL MAMMOGRAM WITH TOMOSYNTHESIS AND CAD
TECHNIQUE: Bilateral digital diagnostic mammography and breast tomosynthesis
was performed. The images were evaluated with computer-aided
detection.

[L MLO synth-2D]
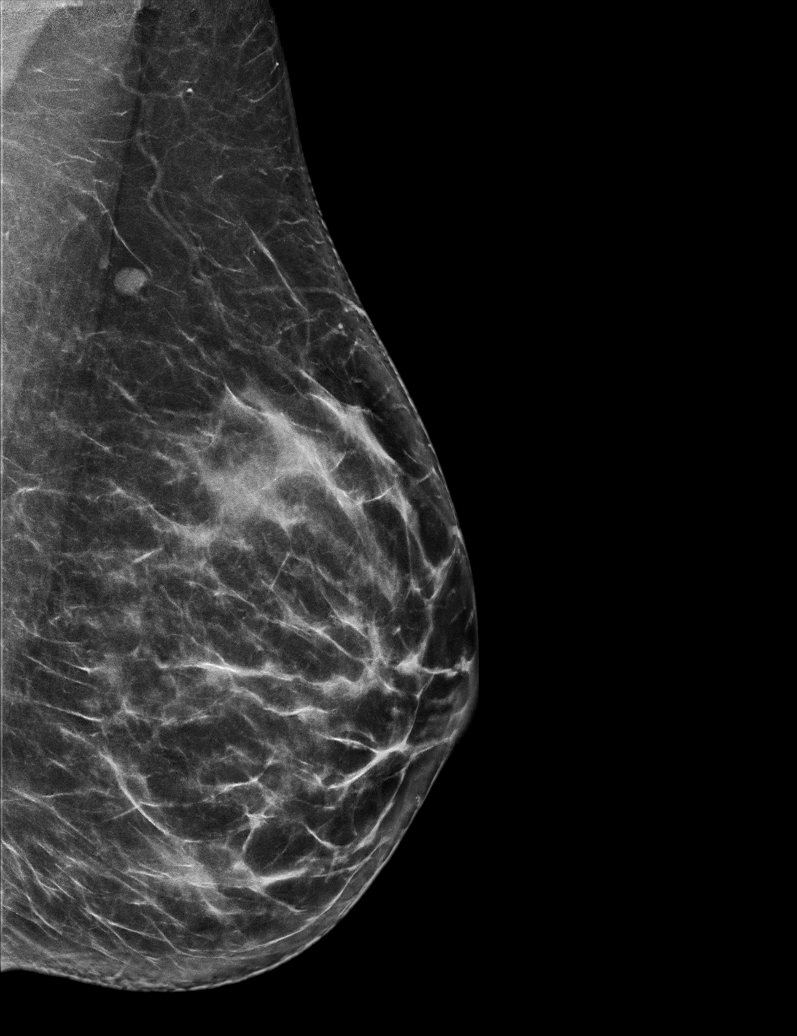

[L CC synth-2D]
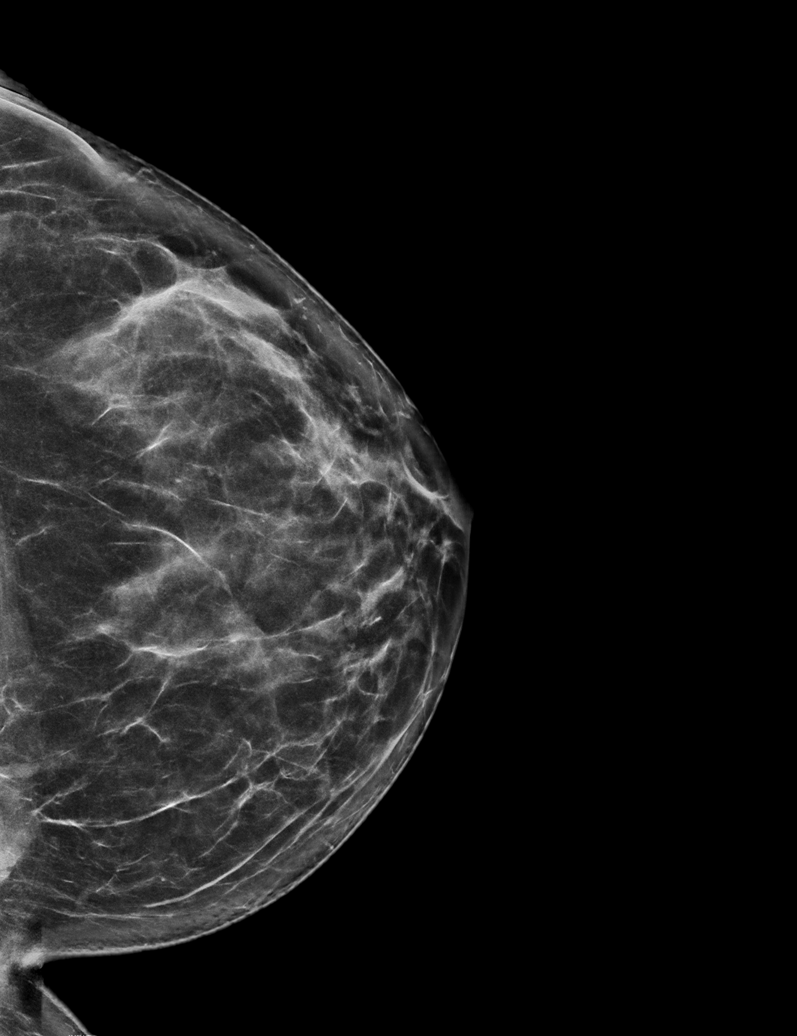

[R CC synth-2D (1 of 2)]
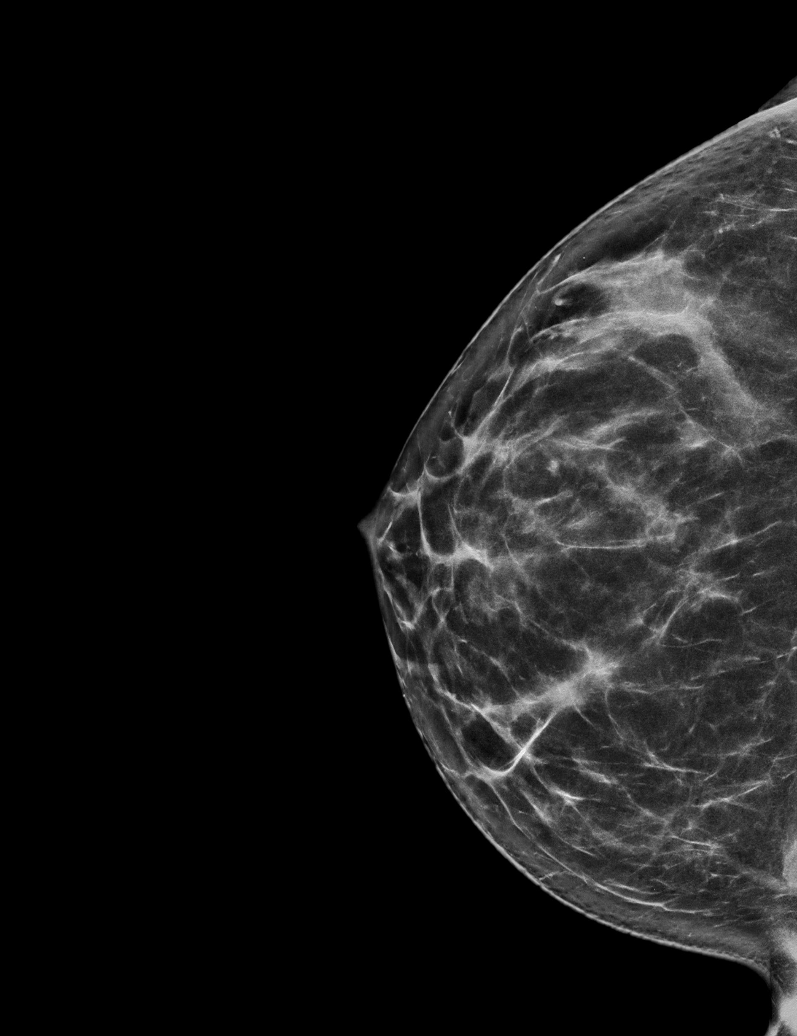

[R CC synth-2D (2 of 2)]
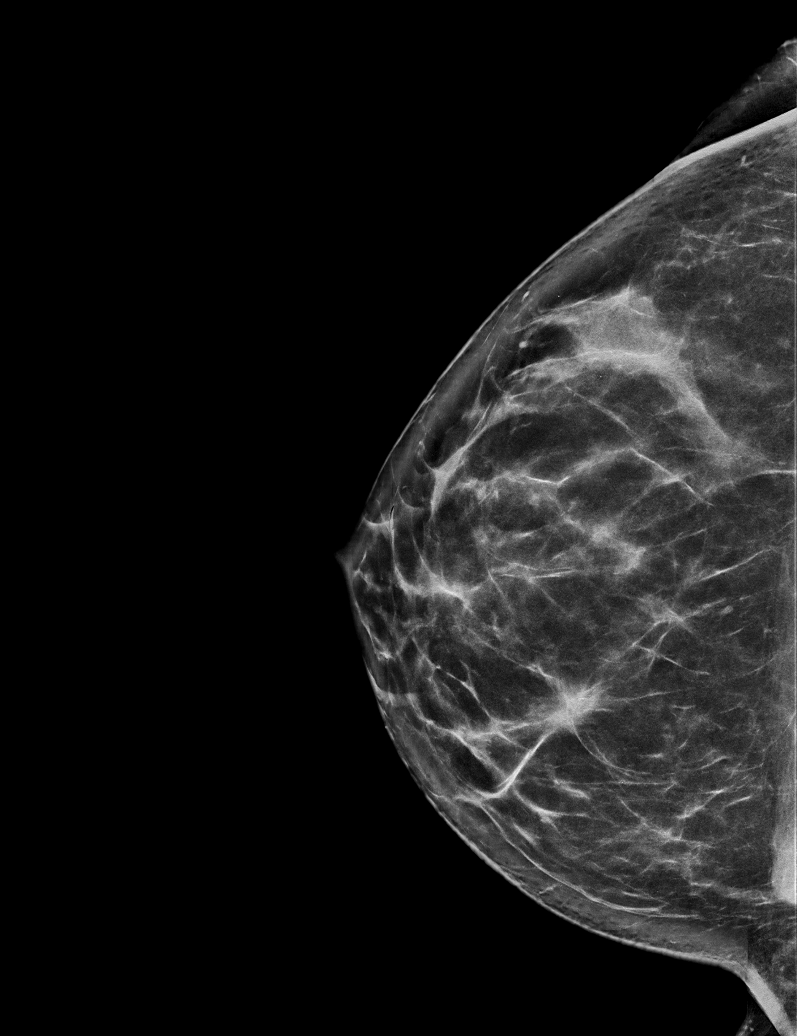

[R MLO synth-2D]
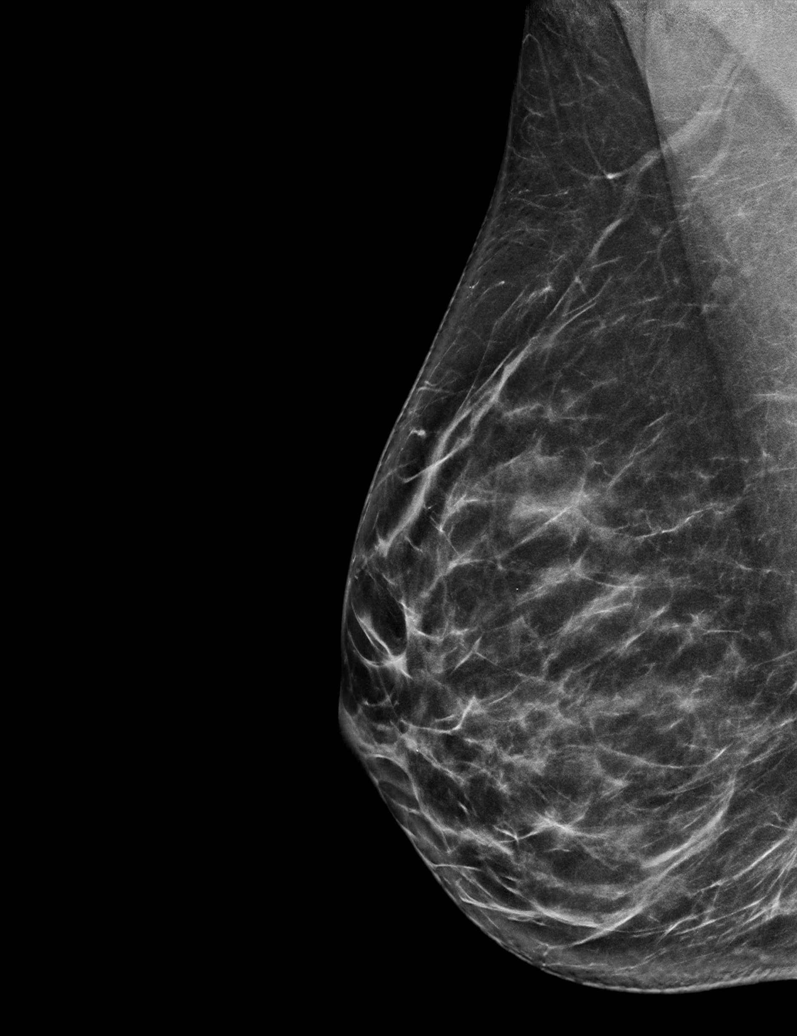

[L CC tomo · tomo slice 36/71.0]
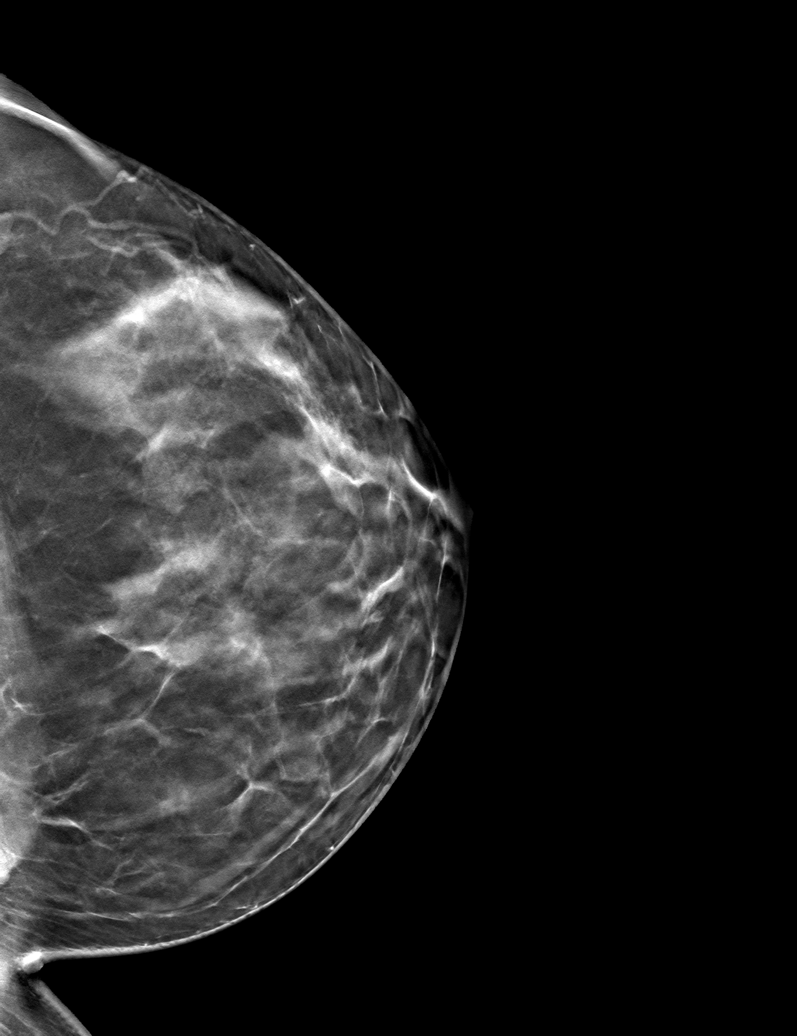

[6 of 30 positions shown; findings below may reference images not displayed]

ACR Breast Density Category c: The breast tissue is heterogeneously
dense, which may obscure small masses.
FINDINGS: No suspicious calcifications, masses or areas of distortion are seen
in the bilateral breasts.
IMPRESSION: 1. No findings in the right breast to correspond with the patient's
nonfocal lateral breast pain.

2.  No mammographic evidence of malignancy in the bilateral breasts.

RECOMMENDATION:
1. Clinical follow-up recommended for the tender area of concern in
the right breast. Any further workup should be based on clinical
grounds.

2. Screening mammogram at age 39 unless there are persistent or
intervening clinical concerns. (Code:8Z-2-E2H)

I have discussed the findings and recommendations with the patient.
If applicable, a reminder letter will be sent to the patient
regarding the next appointment.

BI-RADS CATEGORY  1: Negative.

## 2022-11-08 ENCOUNTER — Other Ambulatory Visit: Payer: Self-pay | Admitting: Internal Medicine

## 2022-11-08 ENCOUNTER — Other Ambulatory Visit: Payer: Self-pay

## 2022-11-08 DIAGNOSIS — M542 Cervicalgia: Secondary | ICD-10-CM

## 2022-11-08 DIAGNOSIS — M25511 Pain in right shoulder: Secondary | ICD-10-CM

## 2022-11-08 MED FILL — Tizanidine HCl Cap 2 MG (Base Equivalent): ORAL | 30 days supply | Qty: 30 | Fill #0 | Status: AC

## 2022-12-12 ENCOUNTER — Ambulatory Visit (INDEPENDENT_AMBULATORY_CARE_PROVIDER_SITE_OTHER): Payer: No Typology Code available for payment source | Admitting: Certified Nurse Midwife

## 2022-12-12 ENCOUNTER — Other Ambulatory Visit (HOSPITAL_COMMUNITY)
Admission: RE | Admit: 2022-12-12 | Discharge: 2022-12-12 | Disposition: A | Discharge status: Home / Self Care | Payer: No Typology Code available for payment source | Source: Ambulatory Visit | Attending: Certified Nurse Midwife | Admitting: Certified Nurse Midwife

## 2022-12-12 ENCOUNTER — Encounter: Payer: Self-pay | Admitting: Certified Nurse Midwife

## 2022-12-12 VITALS — BP 119/81 | HR 80 | Resp 16 | Ht 61.5 in | Wt 142.2 lb

## 2022-12-12 DIAGNOSIS — Z124 Encounter for screening for malignant neoplasm of cervix: Secondary | ICD-10-CM | POA: Diagnosis present

## 2022-12-12 DIAGNOSIS — Z01419 Encounter for gynecological examination (general) (routine) without abnormal findings: Secondary | ICD-10-CM

## 2022-12-12 NOTE — Progress Notes (Signed)
GYNECOLOGY ANNUAL PREVENTATIVE CARE ENCOUNTER NOTE  History:     Brandi Johnson is a 36 y.o. G53P2002 female here for a routine annual gynecologic exam.  Current complaints: none.   Denies abnormal vaginal bleeding, discharge, pelvic pain, problems with intercourse or other gynecologic concerns.     Social Relationship: married Living: spouse and children Work: Event organiser [employer] Exercise: none currently  Smoke/Alcohol/drug use: denies use   Gynecologic History No LMP recorded (within months). (Menstrual status: IUD). Contraception: IUD Last Pap: 11/18/18. Results were: normal with negative HPV Last mammogram: 5/22. Results were: normal ( breast pain) not due until 36 yr old    Upstream - 12/12/22 0914       Pregnancy Intention Screening   Does the patient want to become pregnant in the next year? No    Does the patient's partner want to become pregnant in the next year? No    Would the patient like to discuss contraceptive options today? No      Contraception Wrap Up   Current Method IUD or IUS    End Method IUD or IUS    Contraception Counseling Provided No            The pregnancy intention screening data noted above was reviewed. Potential methods of contraception were discussed. The patient elected to proceed with IUD or IUS.   Obstetric History OB History  Gravida Para Term Preterm AB Living  2 2 2     2   SAB IAB Ectopic Multiple Live Births          2    # Outcome Date GA Lbr Len/2nd Weight Sex Delivery Anes PTL Lv  2 Term 03/01/14   7 lb 6.4 oz (3.357 kg) F CS-LTranv Spinal  LIV  1 Term 06/19/11   8 lb 1.6 oz (3.674 kg) M CS-LTranv   LIV    Past Medical History:  Diagnosis Date   Acute bronchitis due to COVID-19 virus 10/17/2020   AR (allergic rhinitis)    Hemorrhoids in pregnancy    History of renal calculi    with hydroneprosis noted during pregnancy   Hydronephrosis    Motion sickness    UTI (lower urinary tract infection)      Past Surgical History:  Procedure Laterality Date   CESAREAN SECTION  2012    Current Outpatient Medications on File Prior to Visit  Medication Sig Dispense Refill   azelastine (ASTELIN) 0.1 % nasal spray Place 2 sprays into both nostrils 2 (two) times daily. 30 mL 11   Cholecalciferol (VITAMIN D3) 1.25 MG (50000 UT) TABS Take by mouth.     levonorgestrel (MIRENA) 20 MCG/24HR IUD 1 each by Intrauterine route once.     Lysine 500 MG CAPS Take 1 capsule by mouth daily.     meclizine (ANTIVERT) 25 MG tablet Take 25 mg by mouth daily as needed.     tizanidine (ZANAFLEX) 2 MG capsule TAKE 1 CAPSULE (2 MG TOTAL) BY MOUTH AT BEDTIME AS NEEDED FOR MUSCLE SPASMS. 30 capsule 2   valACYclovir (VALTREX) 1000 MG tablet Take 1 tablet (1,000 mg total) by mouth 2 (two) times daily. X 5-7 prn 90 tablet 3   cetirizine (ZYRTEC) 10 MG tablet Take 1 tablet (10 mg total) daily by mouth. 90 tablet 4   ferrous sulfate 325 (65 FE) MG tablet TAKE 1 TABLET BY MOUTH TWICE DAILY WITH FOOD 60 tablet 3   mupirocin ointment (BACTROBAN) 2 %  Apply 1 application  topically 2 (two) times daily. X 5 days (Patient not taking: Reported on 12/12/2022) 22 g 0   Zinc 50 MG TABS Take by mouth daily in the afternoon. (Patient not taking: Reported on 12/12/2022)     No current facility-administered medications on file prior to visit.    Allergies  Allergen Reactions   Sulfa Antibiotics Rash    Social History:  reports that she has never smoked. She has never used smokeless tobacco. She reports that she does not drink alcohol and does not use drugs.  Family History  Problem Relation Age of Onset   Mental illness Mother    Colitis Mother        collagenous   Colon polyps Mother    Heart disease Father 54       CAD s/p CABG   Diabetes Father    Skin cancer Father    Diabetes Maternal Grandmother    Hyperlipidemia Maternal Grandmother    Stroke Maternal Grandmother    Stroke Paternal Grandmother    Celiac disease  Sister    Cancer Neg Hx     The following portions of the patient's history were reviewed and updated as appropriate: allergies, current medications, past family history, past medical history, past social history, past surgical history and problem list.  Review of Systems Pertinent items noted in HPI and remainder of comprehensive ROS otherwise negative.  Physical Exam:  BP 119/81   Pulse 80   Resp 16   Ht 5' 1.5" (1.562 m)   Wt 142 lb 3.2 oz (64.5 kg)   LMP  (Within Months)   BMI 26.43 kg/m  CONSTITUTIONAL: Well-developed, well-nourished female in no acute distress.  HENT:  Normocephalic, atraumatic, External right and left ear normal. Oropharynx is clear and moist EYES: Conjunctivae and EOM are normal. Pupils are equal, round, and reactive to light. No scleral icterus.  NECK: Normal range of motion, supple, no masses.  Normal thyroid.  SKIN: Skin is warm and dry. No rash noted. Not diaphoretic. No erythema. No pallor. MUSCULOSKELETAL: Normal range of motion. No tenderness.  No cyanosis, clubbing, or edema.  2+ distal pulses. NEUROLOGIC: Alert and oriented to person, place, and time. Normal reflexes, muscle tone coordination.  PSYCHIATRIC: Normal mood and affect. Normal behavior. Normal judgment and thought content. CARDIOVASCULAR: Normal heart rate noted, regular rhythm RESPIRATORY: Clear to auscultation bilaterally. Effort and breath sounds normal, no problems with respiration noted. BREASTS: Symmetric in size. No masses, tenderness, skin changes, nipple drainage, or lymphadenopathy bilaterally.  ABDOMEN: Soft, no distention noted.  No tenderness, rebound or guarding.  PELVIC: Normal appearing external genitalia and urethral meatus; normal appearing vaginal mucosa and cervix.  No abnormal discharge noted.  Pap smear collected. IUD strings present.  Normal uterine size, no other palpable masses, no uterine or adnexal tenderness.  .   Assessment and Plan:    1. Women's annual  routine gynecological examination  Pap: collected, Will follow up with results  Mammogram : n/a  Labs: none  Refills: none  Referral: none Declines flu vaccine   Routine preventative health maintenance measures emphasized. Please refer to After Visit Summary for other counseling recommendations.      Doreene Burke, CNM [facility]  [facility],  [facility]

## 2022-12-18 LAB — CYTOLOGY - PAP
Comment: NEGATIVE
Diagnosis: NEGATIVE
High risk HPV: NEGATIVE

## 2022-12-21 ENCOUNTER — Telehealth: Payer: No Typology Code available for payment source | Admitting: Emergency Medicine

## 2022-12-21 DIAGNOSIS — B9789 Other viral agents as the cause of diseases classified elsewhere: Secondary | ICD-10-CM | POA: Diagnosis not present

## 2022-12-21 DIAGNOSIS — J019 Acute sinusitis, unspecified: Secondary | ICD-10-CM

## 2022-12-21 NOTE — Progress Notes (Signed)
E-Visit for Sinus Problems  We are sorry that you are not feeling well.  Here is how we plan to help!  Based on what you have shared with me it looks like you have sinusitis.  Sinusitis is inflammation and infection in the sinus cavities of the head.  Based on your presentation I believe you most likely have Acute Viral Sinusitis.This is an infection most likely caused by a virus. There is not specific treatment for viral sinusitis other than to help you with the symptoms until the infection runs its course.   Antibiotics are not recommended by the Infectious Disease Society of [place] unless you have severe symptoms (including high fever) or you have symptoms for more than 10 days. If you still have symptoms after 10 days, antibiotics should be considered.    Continue using the over the counter medicines you are taking.   I also recommend using saline nasal spray- it can help and can safely be used as often as needed for congestion. Try using saline irrigation, such as with a neti pot, several times a day while you are sick. Many neti pots come with salt packets premeasured to use to make saline. If you use your own salt, make sure it is kosher salt or sea salt (don't use table salt as it has iodine in it and you don't need that in your nose). Use distilled water to make saline. If you mix your own saline using your own salt, the recipe is 1/4 teaspoon salt in 1 cup warm water. Using saline irrigation can help prevent and treat sinus infections.   Some authorities believe that zinc sprays or the use of Echinacea may shorten the course of your symptoms.  Sinus infections are not as easily transmitted as other respiratory infection, however we still recommend that you avoid close contact with loved ones, especially the very young and elderly.  Remember to wash your hands thoroughly throughout the day as this is the number one way to prevent the spread of infection!  Home Care: Only take medications  as instructed by your medical team. Do not take these medications with alcohol. A steam or ultrasonic humidifier can help congestion.  You can place a towel over your head and breathe in the steam from hot water coming from a faucet. Avoid close contacts especially the very young and the elderly. Cover your mouth when you cough or sneeze. Always remember to wash your hands.  Get Help Right Away If: You develop worsening fever or sinus pain. You develop a severe head ache or visual changes. Your symptoms persist after you have completed your treatment plan.  Make sure you Understand these instructions. Will watch your condition. Will get help right away if you are not doing well or get worse.   Thank you for choosing an e-visit.  Your e-visit answers were reviewed by a board certified advanced clinical practitioner to complete your personal care plan. Depending upon the condition, your plan could have included both over the counter or prescription medications.  Please review your pharmacy choice. Make sure the pharmacy is open so you can pick up prescription now. If there is a problem, you may contact your provider through [employer] and have the prescription routed to another pharmacy.  Your safety is important to [place]. If you have drug allergies check your prescription carefully.   For the next 24 hours you can use MyChart to ask questions about today's visit, request a non-urgent call back, or ask  for a work or school excuse. You will get an email in the next two days asking about your experience. I hope that your e-visit has been valuable and will speed your recovery.  I have spent 5 minutes in review of e-visit questionnaire, review and updating patient chart, medical decision making and response to patient.   Willeen Cass, PhD, FNP-BC

## 2023-01-11 ENCOUNTER — Encounter: Payer: Self-pay | Admitting: Internal Medicine

## 2023-01-11 ENCOUNTER — Telehealth (INDEPENDENT_AMBULATORY_CARE_PROVIDER_SITE_OTHER): Payer: 59 | Admitting: Internal Medicine

## 2023-01-11 VITALS — Ht 61.5 in | Wt 142.0 lb

## 2023-01-11 DIAGNOSIS — J0100 Acute maxillary sinusitis, unspecified: Secondary | ICD-10-CM | POA: Diagnosis not present

## 2023-01-11 DIAGNOSIS — F900 Attention-deficit hyperactivity disorder, predominantly inattentive type: Secondary | ICD-10-CM | POA: Diagnosis not present

## 2023-01-11 MED ORDER — AMOXICILLIN-POT CLAVULANATE 875-125 MG PO TABS: 1.0000 | ORAL_TABLET | Freq: Two times a day (BID) | ORAL | 0 refills | Status: DC

## 2023-01-11 MED ORDER — METHYLPREDNISOLONE 4 MG PO TBPK: ORAL_TABLET | ORAL | 0 refills | Status: DC

## 2023-01-11 MED ORDER — METHYLPHENIDATE HCL 5 MG PO TABS: 5.0000 mg | ORAL_TABLET | Freq: Two times a day (BID) | ORAL | 0 refills | Status: DC

## 2023-01-11 NOTE — Progress Notes (Unsigned)
Virtual Visit via [place]   Note   This format is felt to be most appropriate for this patient at this time.  All issues noted in this document were discussed and addressed.  No physical exam was performed (except for noted visual exam findings with Video Visits).   I connected with Brandi Johnson on 01/11/23 at  4:30 PM EST by a video enabled telemedicine application e and verified that I am speaking with the correct person using two identifiers. Location patient: home Location provider: work or home office Persons participating in the virtual visit: patient, provider  I discussed the limitations, risks, security and privacy concerns of performing an evaluation and management service by telephone and the availability of in person appointments. I also discussed with the patient that there may be a patient responsible charge related to this service. The patient expressed understanding and agreed to proceed.  Reason for visit: sinus pain and pressure x 6 days   HPI:  Sinus drainage, pain and pressure for 6 days .  Using afrin but becomes  congested again as soon as it wears off.  Nasal drainage has become yellow ,  accompanied by maxillary pain and pressure.  She has had a  flu exposure last week ,  son was positive.   2) ADHD:  diagnosed in 1994 as a child.  Treated with Ritalin. Daughter has recently been diagnosed and treated. Since she has been working with her daughter on her schoolwork,  she has been reflecting on her own performance at work and would like to resume a  trial of medication to improve her concentration    ROS: See pertinent positives and negatives per HPI.  Past Medical History:  Diagnosis Date   Acute bronchitis due to COVID-19 virus 10/17/2020   AR (allergic rhinitis)    Hemorrhoids in pregnancy    History of renal calculi    with hydroneprosis noted during pregnancy   Hydronephrosis    Motion sickness    UTI (lower urinary tract infection)     Past Surgical  History:  Procedure Laterality Date   CESAREAN SECTION  2012    Family History  Problem Relation Age of Onset   Mental illness Mother    Colitis Mother        collagenous   Colon polyps Mother    Heart disease Father 90       CAD s/p CABG   Diabetes Father    Skin cancer Father    Diabetes Maternal Grandmother    Hyperlipidemia Maternal Grandmother    Stroke Maternal Grandmother    Stroke Paternal Grandmother    Celiac disease Sister    Cancer Neg Hx     SOCIAL HX:  reports that she has never smoked. She has never used smokeless tobacco. She reports that she does not drink alcohol and does not use drugs.    Current Outpatient Medications:    amoxicillin-clavulanate (AUGMENTIN) 875-125 MG tablet, Take 1 tablet by mouth 2 (two) times daily., Disp: 14 tablet, Rfl: 0   azelastine (ASTELIN) 0.1 % nasal spray, Place 2 sprays into both nostrils 2 (two) times daily., Disp: 30 mL, Rfl: 11   cetirizine (ZYRTEC) 10 MG tablet, Take 1 tablet (10 mg total) daily by mouth., Disp: 90 tablet, Rfl: 4   Cholecalciferol (VITAMIN D3) 1.25 MG (50000 UT) TABS, Take by mouth., Disp: , Rfl:    levonorgestrel (MIRENA) 20 MCG/24HR IUD, 1 each by Intrauterine route once., Disp: , Rfl:    Lysine  500 MG CAPS, Take 1 capsule by mouth daily., Disp: , Rfl:    meclizine (ANTIVERT) 25 MG tablet, Take 25 mg by mouth daily as needed., Disp: , Rfl:    methylphenidate (RITALIN) 5 MG tablet, Take 1 tablet (5 mg total) by mouth 2 (two) times daily., Disp: 60 tablet, Rfl: 0   methylPREDNISolone (MEDROL DOSEPAK) 4 MG TBPK tablet, 6 tablets all at once on Day 1,  Then taper by 1 tablet daily until gone, Disp: 21 each, Rfl: 0   tizanidine (ZANAFLEX) 2 MG capsule, TAKE 1 CAPSULE (2 MG TOTAL) BY MOUTH AT BEDTIME AS NEEDED FOR MUSCLE SPASMS., Disp: 30 capsule, Rfl: 2   valACYclovir (VALTREX) 1000 MG tablet, Take 1 tablet (1,000 mg total) by mouth 2 (two) times daily. X 5-7 prn, Disp: 90 tablet, Rfl: 3   Zinc 50 MG TABS, Take  by mouth daily in the afternoon., Disp: , Rfl:    ferrous sulfate 325 (65 FE) MG tablet, TAKE 1 TABLET BY MOUTH TWICE DAILY WITH FOOD, Disp: 60 tablet, Rfl: 3  EXAM:  VITALS per patient if applicable:  GENERAL: alert, oriented, appears well and in no acute distress  HEENT: atraumatic, conjunttiva clear, no obvious abnormalities on inspection of external nose and ears  NECK: normal movements of the head and neck  LUNGS: on inspection no signs of respiratory distress, breathing rate appears normal, no obvious gross SOB, gasping or wheezing  CV: no obvious cyanosis  MS: moves all visible extremities without noticeable abnormality  PSYCH/NEURO: pleasant and cooperative, no obvious depression or anxiety, speech and thought processing grossly intact  ASSESSMENT AND PLAN: Attention deficit hyperactivity disorder (ADHD), predominantly inattentive type Assessment & Plan: Diagnosed during adolescence and treated for several years, with daughter recently diagnosed.  She is noting reduced work performance due to impaired concentration and requesting pharmacotherapy.    Acute non-recurrent maxillary sinusitis Assessment & Plan: Empiric therapy prescribed. For Sinus drainage and congestion with cough productive of purulent sputum and headaches for the last 6 days.  Patient  has used multiple OTD decongestants,  Sinus rinses,  advil and tylenol along with otc cough suppressants without improvement.     Other orders -     methylPREDNISolone; 6 tablets all at once on Day 1,  Then taper by 1 tablet daily until gone  Dispense: 21 each; Refill: 0 -     Amoxicillin-Pot Clavulanate; Take 1 tablet by mouth 2 (two) times daily.  Dispense: 14 tablet; Refill: 0 -     Methylphenidate HCl; Take 1 tablet (5 mg total) by mouth 2 (two) times daily.  Dispense: 60 tablet; Refill: 0      I discussed the assessment and treatment plan with the patient. The patient was provided an opportunity to ask questions and  all were answered. The patient agreed with the plan and demonstrated an understanding of the instructions.   The patient was advised to call back or seek an in-person evaluation if the symptoms worsen or if the condition fails to improve as anticipated.   I spent 20 minutes dedicated to the care of this patient on the date of this encounter to include pre-visit review of her medical history,  Face-to-face time with the patient , and post visit ordering of testing and therapeutics.    Crecencio Mc, MD

## 2023-01-13 NOTE — Assessment & Plan Note (Signed)
Diagnosed during adolescence and treated for several years, with daughter recently diagnosed.  She is noting reduced work performance due to impaired concentration and requesting pharmacotherapy.

## 2023-01-13 NOTE — Assessment & Plan Note (Signed)
Empiric therapy prescribed. For Sinus drainage and congestion with cough productive of purulent sputum and headaches for the last 6 days.  Patient  has used multiple OTD decongestants,  Sinus rinses,  advil and tylenol along with otc cough suppressants without improvement.

## 2023-01-21 ENCOUNTER — Other Ambulatory Visit: Payer: Self-pay

## 2023-01-21 ENCOUNTER — Other Ambulatory Visit: Payer: Self-pay | Admitting: Internal Medicine

## 2023-01-21 DIAGNOSIS — J309 Allergic rhinitis, unspecified: Secondary | ICD-10-CM

## 2023-01-22 ENCOUNTER — Other Ambulatory Visit: Payer: Self-pay

## 2023-01-22 ENCOUNTER — Telehealth: Payer: Self-pay | Admitting: Internal Medicine

## 2023-01-22 MED ORDER — MONTELUKAST SODIUM 10 MG PO TABS
10.0000 mg | ORAL_TABLET | Freq: Every day | ORAL | 1 refills | Status: DC
  Filled 2023-01-22: qty 90, 90d supply, fill #0

## 2023-01-22 MED ORDER — AZELASTINE-FLUTICASONE 137-50 MCG/ACT NA SUSP
1.0000 | Freq: Two times a day (BID) | NASAL | 11 refills | Status: DC
  Filled 2023-01-22 (×2): qty 23, 30d supply, fill #0

## 2023-01-22 NOTE — Telephone Encounter (Signed)
Yes  I have refilled both allergy/sinus medications sent to [facility]

## 2023-01-22 NOTE — Telephone Encounter (Signed)
Pt is aware.  

## 2023-01-22 NOTE — Addendum Note (Signed)
Addended by: Crecencio Mc on: 01/22/2023 12:38 PM   Modules accepted: Orders

## 2023-01-22 NOTE — Telephone Encounter (Signed)
Patient wanted to know if Dr Derrel Nip  would prescribe dymista and singulair. Patient states her sinus are really hurting her from allergies. She used to get Dymista through her ENT, but she has not seen ENT in a long time.

## 2023-02-21 ENCOUNTER — Other Ambulatory Visit: Payer: Self-pay

## 2023-04-26 ENCOUNTER — Other Ambulatory Visit: Payer: Self-pay

## 2023-04-26 ENCOUNTER — Encounter: Payer: Self-pay | Admitting: Internal Medicine

## 2023-04-26 ENCOUNTER — Ambulatory Visit (INDEPENDENT_AMBULATORY_CARE_PROVIDER_SITE_OTHER): Payer: 59 | Admitting: Internal Medicine

## 2023-04-26 VITALS — BP 112/74 | HR 76 | Temp 98.3°F | Ht 61.5 in | Wt 136.0 lb

## 2023-04-26 DIAGNOSIS — G43009 Migraine without aura, not intractable, without status migrainosus: Secondary | ICD-10-CM

## 2023-04-26 DIAGNOSIS — G43909 Migraine, unspecified, not intractable, without status migrainosus: Secondary | ICD-10-CM | POA: Insufficient documentation

## 2023-04-26 MED ORDER — MECLIZINE HCL 25 MG PO TABS
12.5000 mg | ORAL_TABLET | Freq: Three times a day (TID) | ORAL | 0 refills | Status: DC | PRN
  Filled 2023-04-26: qty 15, 10d supply, fill #0

## 2023-04-26 MED ORDER — ONDANSETRON 4 MG PO TBDP
4.0000 mg | ORAL_TABLET | Freq: Three times a day (TID) | ORAL | 0 refills | Status: AC | PRN
  Filled 2023-04-26: qty 20, 7d supply, fill #0

## 2023-04-26 MED ORDER — ALPRAZOLAM 0.25 MG PO TABS
0.2500 mg | ORAL_TABLET | Freq: Two times a day (BID) | ORAL | 0 refills | Status: DC | PRN
  Filled 2023-04-26: qty 20, 10d supply, fill #0

## 2023-04-26 MED ORDER — MECLIZINE HCL 25 MG PO TABS
25.0000 mg | ORAL_TABLET | Freq: Every day | ORAL | 1 refills | Status: AC | PRN
  Filled 2023-04-26: qty 30, 30d supply, fill #0

## 2023-04-26 MED ORDER — SUMATRIPTAN 5 MG/ACT NA SOLN
1.0000 | NASAL | 0 refills | Status: DC | PRN
  Filled 2023-04-26: qty 6, 30d supply, fill #0

## 2023-04-26 NOTE — Progress Notes (Unsigned)
Subjective:  Patient ID: Brandi Johnson, female    DOB: 14-Mar-1986  Age: 37 y.o. MRN: [id number]  CC: There were no encounter diagnoses.   HPI Brandi Johnson presents for  Chief Complaint  Patient presents with   Migraine    New onset migraines   Migraine headaches started one year ago only on work days  always starts with pain behind left eye , accompanied by feeling of pressure behind eye and vertigo .  Aggravated by staring at screens    Had 2  in February back to back  with bilateral shoulders spasm and jaw pain.  used dry needling which helped   Wok spasms  up Wednesday with one ,  nauseated   2 weeks ago after being outside in pollen developed allergy attack: took all allergy meds but nose still itched on the left . (Shingles sign) Started valtrex and itching stopped   History of migraines at age 31 .   Outpatient Medications Prior to Visit  Medication Sig Dispense Refill   azelastine (ASTELIN) 0.1 % nasal spray Place 2 sprays into both nostrils 2 (two) times daily. 30 mL 11   Azelastine-Fluticasone 137-50 MCG/ACT SUSP Place 1 spray into the nose every 12 (twelve) hours. 23 g 11   cetirizine (ZYRTEC) 10 MG tablet Take 1 tablet (10 mg total) daily by mouth. 90 tablet 4   Cholecalciferol (VITAMIN D3) 1.25 MG (50000 UT) TABS Take by mouth.     levonorgestrel (MIRENA) 20 MCG/24HR IUD 1 each by Intrauterine route once.     Lysine 500 MG CAPS Take 1 capsule by mouth daily.     meclizine (ANTIVERT) 25 MG tablet Take 25 mg by mouth daily as needed.     montelukast (SINGULAIR) 10 MG tablet Take 1 tablet (10 mg total) by mouth at bedtime. 90 tablet 1   tizanidine (ZANAFLEX) 2 MG capsule TAKE 1 CAPSULE (2 MG TOTAL) BY MOUTH AT BEDTIME AS NEEDED FOR MUSCLE SPASMS. 30 capsule 2   valACYclovir (VALTREX) 1000 MG tablet Take 1 tablet (1,000 mg total) by mouth 2 (two) times daily. X 5-7 prn 90 tablet 3   methylphenidate (RITALIN) 5 MG tablet Take 1 tablet (5 mg total) by mouth 2 (two)  times daily. 60 tablet 0   amoxicillin-clavulanate (AUGMENTIN) 875-125 MG tablet Take 1 tablet by mouth 2 (two) times daily. (Patient not taking: Reported on 04/26/2023) 14 tablet 0   ferrous sulfate 325 (65 FE) MG tablet TAKE 1 TABLET BY MOUTH TWICE DAILY WITH FOOD 60 tablet 3   methylPREDNISolone (MEDROL DOSEPAK) 4 MG TBPK tablet 6 tablets all at once on Day 1,  Then taper by 1 tablet daily until gone (Patient not taking: Reported on 04/26/2023) 21 each 0   Zinc 50 MG TABS Take by mouth daily in the afternoon. (Patient not taking: Reported on 04/26/2023)     No facility-administered medications prior to visit.    Review of Systems;  Patient denies headache, fevers, malaise, unintentional weight loss, skin rash, eye pain, sinus congestion and sinus pain, sore throat, dysphagia,  hemoptysis , cough, dyspnea, wheezing, chest pain, palpitations, orthopnea, edema, abdominal pain, nausea, melena, diarrhea, constipation, flank pain, dysuria, hematuria, urinary  Frequency, nocturia, numbness, tingling, seizures,  Focal weakness, Loss of consciousness,  Tremor, insomnia, depression, anxiety, and suicidal ideation.      Objective:  BP 112/74   Pulse 76   Temp 98.3 F (36.8 C) (Oral)   Ht 5' 1.5" (1.562 m)  Wt 136 lb (61.7 kg)   SpO2 98%   BMI 25.28 kg/m   BP Readings from Last 3 Encounters:  04/26/23 112/74  12/12/22 119/81  07/13/22 100/72    Wt Readings from Last 3 Encounters:  04/26/23 136 lb (61.7 kg)  01/11/23 142 lb (64.4 kg)  12/12/22 142 lb 3.2 oz (64.5 kg)    Physical Exam  Lab Results  Component Value Date   HGBA1C 5.1 10/28/2015    Lab Results  Component Value Date   CREATININE 0.68 03/23/2022   CREATININE 0.66 11/18/2018   CREATININE 0.76 11/06/2017    Lab Results  Component Value Date   WBC 4.9 03/23/2022   HGB 13.8 03/23/2022   HCT 40.2 03/23/2022   PLT 183.0 03/23/2022   GLUCOSE 82 03/23/2022   CHOL 179 11/29/2020   TRIG 101 11/29/2020   HDL 51  11/29/2020   LDLCALC 110 (H) 11/29/2020   ALT 14 03/23/2022   AST 21 03/23/2022   NA 140 03/23/2022   K 4.1 03/23/2022   CL 109 03/23/2022   CREATININE 0.68 03/23/2022   BUN 17 03/23/2022   CO2 26 03/23/2022   TSH 1.63 03/23/2022   HGBA1C 5.1 10/28/2015    No results found.  Assessment & Plan:  .There are no diagnoses linked to this encounter.   I provided 30 minutes of face-to-face time during this encounter reviewing patient's last visit with me, patient's  most recent visit with cardiology,  nephrology,  and neurology,  recent surgical and non surgical procedures, previous  labs and imaging studies, counseling on currently addressed issues,  and post visit ordering to diagnostics and therapeutics .   Follow-up: No follow-ups on file.   Sherlene Shams, MD

## 2023-04-26 NOTE — Patient Instructions (Signed)
Alprazolam , meclizine,  zofran and imitrex spray have been sent to [facility]

## 2023-04-26 NOTE — Assessment & Plan Note (Signed)
Traim of imitrex nasal spray.  Neuro exam normal

## 2023-05-01 ENCOUNTER — Ambulatory Visit: Payer: 59 | Admitting: Internal Medicine

## 2023-05-17 DIAGNOSIS — H52221 Regular astigmatism, right eye: Secondary | ICD-10-CM | POA: Diagnosis not present

## 2023-05-17 DIAGNOSIS — Z8619 Personal history of other infectious and parasitic diseases: Secondary | ICD-10-CM | POA: Diagnosis not present

## 2023-05-17 DIAGNOSIS — Z8669 Personal history of other diseases of the nervous system and sense organs: Secondary | ICD-10-CM | POA: Diagnosis not present

## 2023-05-17 DIAGNOSIS — H04123 Dry eye syndrome of bilateral lacrimal glands: Secondary | ICD-10-CM | POA: Diagnosis not present

## 2023-05-28 ENCOUNTER — Other Ambulatory Visit: Payer: Self-pay | Admitting: Internal Medicine

## 2023-05-28 DIAGNOSIS — Z1231 Encounter for screening mammogram for malignant neoplasm of breast: Secondary | ICD-10-CM

## 2023-05-29 ENCOUNTER — Ambulatory Visit
Admission: RE | Admit: 2023-05-29 | Discharge: 2023-05-29 | Disposition: A | Discharge status: Home / Self Care | Payer: 59 | Source: Ambulatory Visit | Attending: Internal Medicine | Admitting: Internal Medicine

## 2023-05-29 DIAGNOSIS — Z1231 Encounter for screening mammogram for malignant neoplasm of breast: Secondary | ICD-10-CM | POA: Diagnosis not present

## 2023-06-03 ENCOUNTER — Telehealth: Payer: 59 | Admitting: Nurse Practitioner

## 2023-06-03 ENCOUNTER — Other Ambulatory Visit: Payer: Self-pay

## 2023-06-03 DIAGNOSIS — J0100 Acute maxillary sinusitis, unspecified: Secondary | ICD-10-CM | POA: Diagnosis not present

## 2023-06-03 MED ORDER — AMOXICILLIN-POT CLAVULANATE 875-125 MG PO TABS
1.0000 | ORAL_TABLET | Freq: Two times a day (BID) | ORAL | 0 refills | Status: AC
  Filled 2023-06-03: qty 14, 7d supply, fill #0

## 2023-06-03 NOTE — Progress Notes (Signed)

## 2023-06-05 ENCOUNTER — Ambulatory Visit: Payer: 59 | Admitting: Internal Medicine

## 2023-10-17 ENCOUNTER — Ambulatory Visit: Payer: 59 | Admitting: Nurse Practitioner

## 2023-10-18 ENCOUNTER — Ambulatory Visit (INDEPENDENT_AMBULATORY_CARE_PROVIDER_SITE_OTHER): Payer: 59 | Admitting: Family Medicine

## 2023-10-18 ENCOUNTER — Encounter: Payer: Self-pay | Admitting: Family Medicine

## 2023-10-18 ENCOUNTER — Other Ambulatory Visit: Payer: Self-pay

## 2023-10-18 VITALS — BP 110/70 | HR 90 | Temp 98.0°F | Ht 61.5 in | Wt 140.6 lb

## 2023-10-18 DIAGNOSIS — R051 Acute cough: Secondary | ICD-10-CM

## 2023-10-18 DIAGNOSIS — J0191 Acute recurrent sinusitis, unspecified: Secondary | ICD-10-CM | POA: Diagnosis not present

## 2023-10-18 LAB — POC COVID19 BINAXNOW: SARS Coronavirus 2 Ag: NEGATIVE

## 2023-10-18 MED ORDER — PREDNISONE 20 MG PO TABS
20.0000 mg | ORAL_TABLET | Freq: Every day | ORAL | 0 refills | Status: AC
Start: 2023-10-18 — End: 2023-10-23
  Filled 2023-10-18: qty 5, 5d supply, fill #0

## 2023-10-18 MED ORDER — AMOXICILLIN-POT CLAVULANATE 875-125 MG PO TABS: 1.0000 | ORAL_TABLET | Freq: Two times a day (BID) | ORAL | 0 refills | Status: AC

## 2023-10-18 MED ORDER — DEXTROMETHORPHAN HBR 15 MG/5ML PO SYRP
10.0000 mL | ORAL_SOLUTION | Freq: Four times a day (QID) | ORAL | 0 refills | Status: DC | PRN
Start: 2023-10-18 — End: 2023-12-20
  Filled 2023-10-18: qty 120, 3d supply, fill #0

## 2023-10-18 NOTE — Progress Notes (Unsigned)
SUBJECTIVE:   Chief Complaint  Patient presents with   Cough    Congestion    HPI Presents to clinic for acute visit  Discussed the use of AI scribe software for clinical note transcription with the patient, who gave verbal consent to proceed.  History of Present Illness The patient presents with a chief complaint of cough and congestion that began on Monday. She initially experienced body aches and a sore neck, which she initially attributed to a recent massage. However, she also developed chills and a fever of 100.38F. Despite feeling better the next day, she noticed a sensation of heat on her back and a thin mucus that caused her to cough to clear her throat throughout the day.  By Tuesday night, the patient experienced significant coughing during sleep and woke up on Wednesday with a sensation of a "wet sponge" in her chest, which was painful. She also reported significant congestion and coughing up thick mucus. The patient found it difficult to work due to the need to cough to clear the mucus, which was causing a gagging sensation. She wondered if her symptoms had progressed to a sinus infection.  The patient reported coughing up green, quarter-sized mucus, which turned yellow by the day of the consultation. She also noted an improvement in her chest discomfort. However, she started feeling more fatigued since the day before the consultation.  The patient's son had similar symptoms, but his fever only started the day before the consultation. His tests for various conditions came back negative. The patient has been using nasal decongestants and a medicine to loosen the mucus, along with Advil. She also has an Astelin spray and a combination spray of Astelin and fluticasone, but she believes she is out of the latter at the moment.  The patient has a history of seeing an ENT specialist and used to be on allergy shots. She reports worse symptoms in the fall than in the spring. She has not  seen the ENT specialist in about two years. She was last on antibiotics six months ago and has been on them more frequently this year than usual. She feels that her symptoms are trapped in her upper respiratory tract and has been using nasal rinses to try to clear it. She has not had a CT scan of her head or a scope procedure.  The patient has been taking Singulair and an antihistamine, but forgot to take them the day before the consultation. She reported feeling more red and inflamed that day. She has not been tested for COVID-19.    PERTINENT PMH / PSH: As above  OBJECTIVE:  BP 110/70   Pulse 90   Temp 98 F (36.7 C) (Oral)   Ht 5' 1.5" (1.562 m)   Wt 140 lb 9.6 oz (63.8 kg)   SpO2 99%   BMI 26.14 kg/m    Physical Exam Vitals reviewed.  Constitutional:      General: She is not in acute distress.    Appearance: Normal appearance. She is not ill-appearing, toxic-appearing or diaphoretic.  HENT:     Right Ear: Tympanic membrane, ear canal and external ear normal. There is no impacted cerumen.     Left Ear: Tympanic membrane, ear canal and external ear normal. There is no impacted cerumen.     Nose: Nasal tenderness and congestion present. No rhinorrhea.     Right Nostril: No occlusion.     Left Nostril: No occlusion.     Right Turbinates: Swollen.  Left Turbinates: Swollen.     Right Sinus: Maxillary sinus tenderness and frontal sinus tenderness present.     Left Sinus: Maxillary sinus tenderness and frontal sinus tenderness present.     Mouth/Throat:     Mouth: Mucous membranes are moist.     Pharynx: Oropharynx is clear. No oropharyngeal exudate or posterior oropharyngeal erythema.  Eyes:     General:        Right eye: No discharge.        Left eye: No discharge.     Conjunctiva/sclera: Conjunctivae normal.  Cardiovascular:     Rate and Rhythm: Normal rate and regular rhythm.     Heart sounds: Normal heart sounds.  Pulmonary:     Effort: Pulmonary effort is normal.      Breath sounds: Normal breath sounds.  Musculoskeletal:        General: Normal range of motion.  Lymphadenopathy:     Cervical: No cervical adenopathy.  Skin:    General: Skin is warm and dry.  Neurological:     General: No focal deficit present.     Mental Status: She is alert and oriented to person, place, and time. Mental status is at baseline.  Psychiatric:        Mood and Affect: Mood normal.        Behavior: Behavior normal.        Thought Content: Thought content normal.        Judgment: Judgment normal.        04/26/2023   11:20 AM 01/11/2023    4:45 PM 07/13/2022    8:22 AM 06/13/2022    1:10 PM 03/23/2022    8:39 AM  Depression screen PHQ 2/9  Decreased Interest 0 0 0 0 0  Down, Depressed, Hopeless 0 0 0 0 0  PHQ - 2 Score 0 0 0 0 0       No data to display          ASSESSMENT/PLAN:  Acute recurrent sinusitis, unspecified location Assessment & Plan: History of chronic sinus issues with current symptoms of postnasal drip and congestion. Patient has been using Astelin spray and Guaifenesin for symptom relief. Sinus tenderness on exam, without fevers.   -Trial course low dose steroids -Will provide prescription for Augmentin, patient will hold off on filling and instructions provided to have filled if symptoms worsen and develops fevers. -Refill prescription for combination Astelin and Fluticasone spray. -Recommend follow-up with ENT specialist for further evaluation and possible imaging.    Orders: -     predniSONE; Take 1 tablet (20 mg total) by mouth daily with breakfast for 5 days.  Dispense: 5 tablet; Refill: 0 -     Amoxicillin-Pot Clavulanate; Take 1 tablet by mouth 2 (two) times daily for 5 days.  Dispense: 10 tablet; Refill: 0  Acute cough Assessment & Plan: Symptoms of cough, congestion, and fatigue with a history of one day fever.  No further increase in temp.   Mucus color has changed from green to yellow, suggesting possible bacterial  infection. However, lungs sound clear on examination. Difficulty sleeping due to postnasal drip and cough. -Test for COVID-19 to rule out as a cause of symptoms. Negative -Start a short course of steroids (20mg  for 5 days) to reduce inflammation. -Provide a prescription for Augmentin (Amoxicillin/Clavulanate) to be filled and used only if symptoms worsen. -Continue use of dextromethorphan for cough suppression. -Recommend use of a humidifier to help clear mucus.   Orders: -  POC COVID-19 BinaxNow -     Dextromethorphan HBr; Take 10 mLs (30 mg total) by mouth 4 (four) times daily as needed for cough.  Dispense: 120 mL; Refill: 0     PDMP reviewed  Return if symptoms worsen or fail to improve, for PCP.  Dana Allan, MD

## 2023-10-18 NOTE — Patient Instructions (Signed)
It was a pleasure meeting you today. Thank you for allowing me to take part in your health care.  Our goals for today as we discussed include:  Start Prednisone 20 mg daily Fill Augmentin prescription only if symptoms worsen  Recommend follow up with ENT  Follow up with PCP if symptoms worsen   If you have any questions or concerns, please do not hesitate to call the office at [phone].  I look forward to our next visit and until then take care and stay safe.  Regards,   Dana Allan, MD   [facility]

## 2023-10-20 DIAGNOSIS — J019 Acute sinusitis, unspecified: Secondary | ICD-10-CM | POA: Insufficient documentation

## 2023-10-20 DIAGNOSIS — R051 Acute cough: Secondary | ICD-10-CM | POA: Insufficient documentation

## 2023-10-20 NOTE — Assessment & Plan Note (Signed)
Recurrent symptoms. Sinus tenderness on exam, without fevers.   Trial course low dose steroids Will provide prescription for Augmentin, patient will hold off on filling and instructions provided to have filled if symptoms worsen and develops fevers. Follow up with PCP if symptoms worsen

## 2023-10-20 NOTE — Assessment & Plan Note (Signed)
Symptoms of cough, congestion, and fatigue with a history of one day fever.  No further increase in temp.   Mucus color has changed from green to yellow, suggesting possible bacterial infection. However, lungs sound clear on examination. Difficulty sleeping due to postnasal drip and cough. -Test for COVID-19 to rule out as a cause of symptoms. Negative -Start a short course of steroids (20mg  for 5 days) to reduce inflammation. -Provide a prescription for Augmentin (Amoxicillin/Clavulanate) to be filled and used only if symptoms worsen. -Continue use of dextromethorphan for cough suppression. -Recommend use of a humidifier to help clear mucus.

## 2023-11-15 ENCOUNTER — Other Ambulatory Visit: Payer: Self-pay | Admitting: Otolaryngology

## 2023-11-15 DIAGNOSIS — J329 Chronic sinusitis, unspecified: Secondary | ICD-10-CM

## 2023-11-15 DIAGNOSIS — G501 Atypical facial pain: Secondary | ICD-10-CM | POA: Diagnosis not present

## 2023-11-15 DIAGNOSIS — J301 Allergic rhinitis due to pollen: Secondary | ICD-10-CM | POA: Diagnosis not present

## 2023-11-26 ENCOUNTER — Telehealth: Payer: 59 | Admitting: Family Medicine

## 2023-11-26 DIAGNOSIS — R197 Diarrhea, unspecified: Secondary | ICD-10-CM

## 2023-11-26 NOTE — Progress Notes (Signed)

## 2023-12-06 DIAGNOSIS — R252 Cramp and spasm: Secondary | ICD-10-CM | POA: Diagnosis not present

## 2023-12-06 DIAGNOSIS — M79662 Pain in left lower leg: Secondary | ICD-10-CM | POA: Diagnosis not present

## 2023-12-06 DIAGNOSIS — R609 Edema, unspecified: Secondary | ICD-10-CM | POA: Diagnosis not present

## 2023-12-06 DIAGNOSIS — I83813 Varicose veins of bilateral lower extremities with pain: Secondary | ICD-10-CM | POA: Diagnosis not present

## 2023-12-06 DIAGNOSIS — G2581 Restless legs syndrome: Secondary | ICD-10-CM | POA: Diagnosis not present

## 2023-12-06 DIAGNOSIS — M79661 Pain in right lower leg: Secondary | ICD-10-CM | POA: Diagnosis not present

## 2023-12-13 ENCOUNTER — Ambulatory Visit
Admission: RE | Admit: 2023-12-13 | Discharge: 2023-12-13 | Disposition: A | Discharge status: Home / Self Care | Payer: 59 | Source: Ambulatory Visit | Attending: Otolaryngology | Admitting: Otolaryngology

## 2023-12-13 DIAGNOSIS — J329 Chronic sinusitis, unspecified: Secondary | ICD-10-CM

## 2023-12-13 DIAGNOSIS — R930 Abnormal findings on diagnostic imaging of skull and head, not elsewhere classified: Secondary | ICD-10-CM | POA: Diagnosis not present

## 2023-12-13 MED ORDER — GADOPICLENOL 0.5 MMOL/ML IV SOLN
6.0000 mL | Freq: Once | INTRAVENOUS | Status: AC | PRN
  Administered 2023-12-13: 6 mL via INTRAVENOUS

## 2023-12-20 ENCOUNTER — Ambulatory Visit (INDEPENDENT_AMBULATORY_CARE_PROVIDER_SITE_OTHER): Payer: 59 | Admitting: Internal Medicine

## 2023-12-20 ENCOUNTER — Other Ambulatory Visit: Payer: Self-pay | Admitting: Internal Medicine

## 2023-12-20 ENCOUNTER — Other Ambulatory Visit: Payer: Self-pay

## 2023-12-20 ENCOUNTER — Encounter: Payer: Self-pay | Admitting: Internal Medicine

## 2023-12-20 VITALS — BP 98/66 | HR 111 | Ht 61.5 in | Wt 140.8 lb

## 2023-12-20 DIAGNOSIS — F900 Attention-deficit hyperactivity disorder, predominantly inattentive type: Secondary | ICD-10-CM

## 2023-12-20 DIAGNOSIS — R0683 Snoring: Secondary | ICD-10-CM

## 2023-12-20 DIAGNOSIS — G43009 Migraine without aura, not intractable, without status migrainosus: Secondary | ICD-10-CM | POA: Diagnosis not present

## 2023-12-20 DIAGNOSIS — M25511 Pain in right shoulder: Secondary | ICD-10-CM | POA: Diagnosis not present

## 2023-12-20 DIAGNOSIS — E236 Other disorders of pituitary gland: Secondary | ICD-10-CM

## 2023-12-20 DIAGNOSIS — M542 Cervicalgia: Secondary | ICD-10-CM

## 2023-12-20 DIAGNOSIS — M26609 Unspecified temporomandibular joint disorder, unspecified side: Secondary | ICD-10-CM | POA: Diagnosis not present

## 2023-12-20 MED ORDER — PREDNISONE 10 MG PO TABS
ORAL_TABLET | ORAL | 0 refills | Status: AC
  Filled 2023-12-20: qty 21, 6d supply, fill #0

## 2023-12-20 MED ORDER — MONTELUKAST SODIUM 10 MG PO TABS
10.0000 mg | ORAL_TABLET | Freq: Every day | ORAL | 1 refills | Status: AC
  Filled 2023-12-20 – 2024-03-10 (×2): qty 90, 90d supply, fill #0
  Filled 2024-11-10: qty 90, 90d supply, fill #1

## 2023-12-20 MED ORDER — TIZANIDINE HCL 2 MG PO CAPS
2.0000 mg | ORAL_CAPSULE | Freq: Every evening | ORAL | 2 refills | Status: AC | PRN
  Filled 2023-12-20 – 2024-01-11 (×2): qty 30, 30d supply, fill #0
  Filled 2024-09-02: qty 30, 30d supply, fill #1
  Filled 2024-11-10: qty 30, 30d supply, fill #2

## 2023-12-20 MED ORDER — AZELASTINE-FLUTICASONE 137-50 MCG/ACT NA SUSP
1.0000 | Freq: Two times a day (BID) | NASAL | 11 refills | Status: AC
Start: 2023-12-20 — End: ?
  Filled 2023-12-20 – 2024-03-10 (×2): qty 23, 30d supply, fill #0

## 2023-12-20 NOTE — Patient Instructions (Signed)
You have no clear signs of symptoms of intracranial hypertension, because your headaches may be due to sleep apnea or  chronic sinusitis.  We 're going to treat your sinus symptoms and snoring first before BOTOX  and sleep study:  Adding a prednisone taper and  fluticasone to your current sinus regimen (fluticasone/azelastine = Dymista)   If we don't see an improvement in the headaches with managing the snoring and congestion,  we'll purse the sleep study and a neurology referral

## 2023-12-20 NOTE — Progress Notes (Unsigned)
Subjective:  Patient ID: Brandi Johnson, female    DOB: [birth year]  Age: 37 y.o. MRN: [id number]  CC: There were no encounter diagnoses.   HPI SARANNE Johnson presents for No chief complaint on file.   Brandi Johnson is a healthy 37 yr old female with a history of environmental allergies  who recently had an MRI brain for the purpose of evaluation of  recurrent sinusitis (referred  to ENT by Dr Clent Ridges,  MRI was done instead of CT  because of frequent non migraine periorbital headaches  ordered by Dr Andee Poles) and was found to have a partially empty sella turcica. No sellar masses or other intacranial masses were seen.  She has no history of nausea vomiting  or pituitary disorders . But has a history of migraines which have occurred with a frequency of  < 1   per month  ,  but tother headaches behind the eys are occurring behind the eyes occurring 2/week both in the morning or later on during the day at work   She has had new onset snoring for the last 6 months .  Taking singulair and claritin daily most day   She was told by her dentist Carlis Abbott,  [facility]ist  this morning that she needs a referral for the  snore guard   Her dental  hygienist has recommended referral to her dentist to receive Botox for TMJ ; waking up 2-3 times per week     Outpatient Medications Prior to Visit  Medication Sig Dispense Refill   ALPRAZolam (XANAX) 0.25 MG tablet Take 1 tablet (0.25 mg total) by mouth 2 (two) times daily as needed (vertigo). 20 tablet 0   azelastine (ASTELIN) 0.1 % nasal spray Place 2 sprays into both nostrils 2 (two) times daily. 30 mL 11   Azelastine-Fluticasone 137-50 MCG/ACT SUSP Place 1 spray into the nose every 12 (twelve) hours. 23 g 11   cetirizine (ZYRTEC) 10 MG tablet Take 1 tablet (10 mg total) daily by mouth. 90 tablet 4   Cholecalciferol (VITAMIN D3) 1.25 MG (50000 UT) TABS Take by mouth.     dextromethorphan 15 MG/5ML syrup Take 10 mLs (30 mg total) by mouth 4 (four)  times daily as needed for cough. 120 mL 0   levonorgestrel (MIRENA) 20 MCG/24HR IUD 1 each by Intrauterine route once.     Lysine 500 MG CAPS Take 1 capsule by mouth daily.     meclizine (ANTIVERT) 25 MG tablet Take 1 tablet (25 mg total) by mouth daily as needed. 30 tablet 1   meclizine (ANTIVERT) 25 MG tablet Take 0.5 tablets (12.5 mg total) by mouth 3 (three) times daily as needed for dizziness. 15 tablet 0   montelukast (SINGULAIR) 10 MG tablet Take 1 tablet (10 mg total) by mouth at bedtime. 90 tablet 1   ondansetron (ZOFRAN-ODT) 4 MG disintegrating tablet Take 1 tablet (4 mg total) by mouth every 8 (eight) hours as needed for nausea or vomiting. 20 tablet 0   SUMAtriptan (IMITREX) 5 MG/ACT nasal spray Place 1 spray (5 mg total) into the nose every 2 (two) hours as needed for migraine. 6 each 0   tizanidine (ZANAFLEX) 2 MG capsule TAKE 1 CAPSULE (2 MG TOTAL) BY MOUTH AT BEDTIME AS NEEDED FOR MUSCLE SPASMS. 30 capsule 2   valACYclovir (VALTREX) 1000 MG tablet Take 1 tablet (1,000 mg total) by mouth 2 (two) times daily. X 5-7 prn 90 tablet 3   No facility-administered medications prior to  visit.    Review of Systems;  Patient denies headache, fevers, malaise, unintentional weight loss, skin rash, eye pain, sinus congestion and sinus pain, sore throat, dysphagia,  hemoptysis , cough, dyspnea, wheezing, chest pain, palpitations, orthopnea, edema, abdominal pain, nausea, melena, diarrhea, constipation, flank pain, dysuria, hematuria, urinary  Frequency, nocturia, numbness, tingling, seizures,  Focal weakness, Loss of consciousness,  Tremor, insomnia, depression, anxiety, and suicidal ideation.      Objective:  There were no vitals taken for this visit.  BP Readings from Last 3 Encounters:  10/18/23 110/70  04/26/23 112/74  12/12/22 119/81    Wt Readings from Last 3 Encounters:  10/18/23 140 lb 9.6 oz (63.8 kg)  04/26/23 136 lb (61.7 kg)  01/11/23 142 lb (64.4 kg)    Physical  Exam  Lab Results  Component Value Date   HGBA1C 5.1 10/28/2015    Lab Results  Component Value Date   CREATININE 0.68 03/23/2022   CREATININE 0.66 11/18/2018   CREATININE 0.76 11/06/2017    Lab Results  Component Value Date   WBC 4.9 03/23/2022   HGB 13.8 03/23/2022   HCT 40.2 03/23/2022   PLT 183.0 03/23/2022   GLUCOSE 82 03/23/2022   CHOL 179 11/29/2020   TRIG 101 11/29/2020   HDL 51 11/29/2020   LDLCALC 110 (H) 11/29/2020   ALT 14 03/23/2022   AST 21 03/23/2022   NA 140 03/23/2022   K 4.1 03/23/2022   CL 109 03/23/2022   CREATININE 0.68 03/23/2022   BUN 17 03/23/2022   CO2 26 03/23/2022   TSH 1.63 03/23/2022   HGBA1C 5.1 10/28/2015    MR BRAIN W WO CONTRAST Result Date: 12/13/2023 CLINICAL DATA:  Provided history: Chronic sinusitis, unspecified location. Additional history provided by the scanning technologist: sinus infection/inflammation, new onset snoring, headaches. EXAM: MRI HEAD WITHOUT AND WITH CONTRAST TECHNIQUE: Multiplanar, multiecho pulse sequences of the brain and surrounding structures were obtained without and with intravenous contrast. CONTRAST:  6 mL Vueway intravenous contrast. COMPARISON:  None. FINDINGS: Brain: Cerebral volume is normal. Partially empty sella turcica. No cortical encephalomalacia is identified. No significant cerebral white matter disease. There is no acute infarct. No evidence of an intracranial mass. No chronic intracranial blood products. No extra-axial fluid collection. No midline shift. No pathologic intracranial enhancement identified. Vascular: Maintained flow voids within the proximal large arterial vessels. Skull and upper cervical spine: No focal worrisome marrow lesion. Sinuses/Orbits: No mass or acute finding within the imaged orbits. No significant paranasal sinus disease. IMPRESSION: 1. No evidence of an acute intracranial abnormality. 2. Partially empty sella turcica. This finding can reflect incidental anatomic variation,  or alternatively, it can be associated with chronic idiopathic intracranial hypertension (pseudotumor cerebri). 3. Otherwise unremarkable MRI appearance the brain. Electronically Signed   By: Jackey Loge D.O.   On: 12/13/2023 11:04    Assessment & Plan:  .There are no diagnoses linked to this encounter.   I provided 30 minutes of face-to-face time during this encounter reviewing patient's last visit with me, patient's  most recent visit with cardiology,  nephrology,  and neurology,  recent surgical and non surgical procedures, previous  labs and imaging studies, counseling on currently addressed issues,  and post visit ordering to diagnostics and therapeutics .   Follow-up: No follow-ups on file.   Sherlene Shams, MD

## 2023-12-22 DIAGNOSIS — M26609 Unspecified temporomandibular joint disorder, unspecified side: Secondary | ICD-10-CM | POA: Insufficient documentation

## 2023-12-22 DIAGNOSIS — E236 Other disorders of pituitary gland: Secondary | ICD-10-CM | POA: Insufficient documentation

## 2023-12-22 NOTE — Assessment & Plan Note (Addendum)
Incidental  finding  on MRI done to evaluate sinuses she has no attributable symptoms.

## 2023-12-22 NOTE — Assessment & Plan Note (Signed)
DME for mouth guard written and tizanidine rx given

## 2023-12-22 NOTE — Assessment & Plan Note (Signed)
Diagnosed during adolescence and treated for several years, with daughter recently diagnosed.  She is noting improvedk performance with medication

## 2023-12-22 NOTE — Assessment & Plan Note (Signed)
Frequency is < 1 /month and resolves with

## 2023-12-27 ENCOUNTER — Encounter: Payer: Self-pay | Admitting: Certified Nurse Midwife

## 2023-12-27 ENCOUNTER — Ambulatory Visit (INDEPENDENT_AMBULATORY_CARE_PROVIDER_SITE_OTHER): Payer: 59 | Admitting: Certified Nurse Midwife

## 2023-12-27 VITALS — BP 104/71 | HR 74 | Ht 62.0 in | Wt 143.6 lb

## 2023-12-27 DIAGNOSIS — Z01419 Encounter for gynecological examination (general) (routine) without abnormal findings: Secondary | ICD-10-CM | POA: Diagnosis not present

## 2023-12-27 NOTE — Patient Instructions (Signed)

## 2023-12-27 NOTE — Progress Notes (Signed)
GYNECOLOGY ANNUAL PREVENTATIVE CARE ENCOUNTER NOTE  History:     Brandi Johnson is a 37 y.o. G11P2002 female here for a routine annual gynecologic exam.  Current complaints: none.   Denies abnormal vaginal bleeding, discharge, pelvic pain, problems with intercourse or other gynecologic concerns.     Social Relationship: Married Living: spouse and children  Work: Systems analyst Exercise:  none Smoke/Alcohol/drug use:  Gynecologic History No LMP recorded. (Menstrual status: IUD). Contraception: IUD Last Pap: 12/12/2022. Results were: normal with negative HPV Last mammogram: n/a .    Upstream - 12/27/23 1191       Pregnancy Intention Screening   Does the patient want to become pregnant in the next year? No    Does the patient's partner want to become pregnant in the next year? No    Would the patient like to discuss contraceptive options today? No      Contraception Wrap Up   Current Method IUD or IUS    End Method IUD or IUS            The pregnancy intention screening data noted above was reviewed. Potential methods of contraception were discussed. The patient elected to proceed with IUD or IUS.   Obstetric History OB History  Gravida Para Term Preterm AB Living  2 2 2   2   SAB IAB Ectopic Multiple Live Births      2    # Outcome Date GA Lbr Len/2nd Weight Sex Type Anes PTL Lv  2 Term 03/01/14   7 lb 6.4 oz (3.357 kg) F CS-LTranv Spinal  LIV  1 Term 06/19/11   8 lb 1.6 oz (3.674 kg) M CS-LTranv   LIV    Past Medical History:  Diagnosis Date   Acute bronchitis due to COVID-19 virus 10/17/2020   AR (allergic rhinitis)    Hemorrhoids in pregnancy    History of renal calculi    with hydroneprosis noted during pregnancy   Hydronephrosis    Motion sickness    UTI (lower urinary tract infection)     Past Surgical History:  Procedure Laterality Date   CESAREAN SECTION  2012    Current Outpatient Medications on File Prior to Visit   Medication Sig Dispense Refill   ALPRAZolam (XANAX) 0.25 MG tablet Take 1 tablet (0.25 mg total) by mouth 2 (two) times daily as needed (vertigo). 20 tablet 0   azelastine (ASTELIN) 0.1 % nasal spray Place 2 sprays into both nostrils 2 (two) times daily. 30 mL 11   Azelastine-Fluticasone 137-50 MCG/ACT SUSP Place 1 spray into the nose every 12 (twelve) hours. 23 g 11   cetirizine (ZYRTEC) 10 MG tablet Take 1 tablet (10 mg total) daily by mouth. 90 tablet 4   Cholecalciferol (VITAMIN D3) 1.25 MG (50000 UT) TABS Take by mouth.     levonorgestrel (MIRENA) 20 MCG/24HR IUD 1 each by Intrauterine route once.     Lysine 500 MG CAPS Take 1 capsule by mouth daily.     meclizine (ANTIVERT) 25 MG tablet Take 1 tablet (25 mg total) by mouth daily as needed. 30 tablet 1   meclizine (ANTIVERT) 25 MG tablet Take 0.5 tablets (12.5 mg total) by mouth 3 (three) times daily as needed for dizziness. 15 tablet 0   montelukast (SINGULAIR) 10 MG tablet Take 1 tablet (10 mg total) by mouth at bedtime. 90 tablet 1   ondansetron (ZOFRAN-ODT) 4 MG disintegrating tablet Take 1 tablet (4 mg  total) by mouth every 8 (eight) hours as needed for nausea or vomiting. 20 tablet 0   tizanidine (ZANAFLEX) 2 MG capsule Take 1 capsule (2 mg total) by mouth at bedtime as needed for muscle spasms 30 capsule 2   valACYclovir (VALTREX) 1000 MG tablet Take 1 tablet (1,000 mg total) by mouth 2 (two) times daily. X 5-7 prn 90 tablet 3   No current facility-administered medications on file prior to visit.    Allergies  Allergen Reactions   Sulfa Antibiotics Rash    Social History:  reports that she has never smoked. She has never used smokeless tobacco. She reports that she does not drink alcohol and does not use drugs.  Family History  Problem Relation Age of Onset   Mental illness Mother    Colitis Mother        collagenous   Colon polyps Mother    Heart disease Father 33       CAD s/p CABG   Diabetes Father    Skin cancer  Father    Diabetes Maternal Grandmother    Hyperlipidemia Maternal Grandmother    Stroke Maternal Grandmother    Stroke Paternal Grandmother    Celiac disease Sister    Cancer Neg Hx     The following portions of the patient's history were reviewed and updated as appropriate: allergies, current medications, past family history, past medical history, past social history, past surgical history and problem list.  Review of Systems Pertinent items noted in HPI and remainder of comprehensive ROS otherwise negative.  Physical Exam:  BP 104/71   Pulse 74   Ht 5\' 2"  (1.575 m)   Wt 143 lb 9.6 oz (65.1 kg)   BMI 26.26 kg/m  CONSTITUTIONAL: Well-developed, well-nourished female in no acute distress.  HENT:  Normocephalic, atraumatic, External right and left ear normal. Oropharynx is clear and moist EYES: Conjunctivae and EOM are normal. Pupils are equal, round, and reactive to light. No scleral icterus.  NECK: Normal range of motion, supple, no masses.  Normal thyroid.  SKIN: Skin is warm and dry. No rash noted. Not diaphoretic. No erythema. No pallor. MUSCULOSKELETAL: Normal range of motion. No tenderness.  No cyanosis, clubbing, or edema.  2+ distal pulses. NEUROLOGIC: Alert and oriented to person, place, and time. Normal reflexes, muscle tone coordination.  PSYCHIATRIC: Normal mood and affect. Normal behavior. Normal judgment and thought content. CARDIOVASCULAR: Normal heart rate noted, regular rhythm RESPIRATORY: Clear to auscultation bilaterally. Effort and breath sounds normal, no problems with respiration noted. BREASTS: Symmetric in size. No masses, tenderness, skin changes, nipple drainage, or lymphadenopathy bilaterally.  ABDOMEN: Soft, no distention noted.  No tenderness, rebound or guarding.  PELVIC: Normal appearing external genitalia and urethral meatus; normal appearing vaginal mucosa and cervix.  No abnormal discharge noted.  Pap smear not due.  Normal uterine size, no other  palpable masses, no uterine or adnexal tenderness.  .   Assessment and Plan:    1. Women's annual routine gynecological examination (Primary)  Pap: not due  Mammogram : not due  Labs: none  Refills: none  Referral: none  Routine preventative health maintenance measures emphasized. Please refer to After Visit Summary for other counseling recommendations.      Doreene Burke, CNM [facility]  [facility],  [facility]

## 2024-01-12 ENCOUNTER — Other Ambulatory Visit: Payer: Self-pay

## 2024-01-13 ENCOUNTER — Other Ambulatory Visit: Payer: Self-pay

## 2024-03-10 ENCOUNTER — Other Ambulatory Visit: Payer: Self-pay

## 2024-03-16 ENCOUNTER — Other Ambulatory Visit: Payer: Self-pay | Admitting: Internal Medicine

## 2024-03-16 ENCOUNTER — Other Ambulatory Visit: Payer: Self-pay

## 2024-03-17 ENCOUNTER — Other Ambulatory Visit: Payer: Self-pay

## 2024-03-17 MED FILL — Alprazolam Tab 0.25 MG: ORAL | 10 days supply | Qty: 20 | Fill #0 | Status: AC

## 2024-03-18 ENCOUNTER — Other Ambulatory Visit: Payer: Self-pay

## 2024-03-18 ENCOUNTER — Other Ambulatory Visit: Payer: Self-pay | Admitting: Internal Medicine

## 2024-03-18 DIAGNOSIS — B029 Zoster without complications: Secondary | ICD-10-CM

## 2024-03-18 MED ORDER — VALACYCLOVIR HCL 1 G PO TABS
1000.0000 mg | ORAL_TABLET | Freq: Two times a day (BID) | ORAL | 3 refills | Status: DC
  Filled 2024-03-18: qty 90, 45d supply, fill #0

## 2024-04-10 ENCOUNTER — Telehealth: Payer: Self-pay

## 2024-04-10 NOTE — Telephone Encounter (Signed)
 Spoke to pt she wanted to let us know that she has new insurance now and would like to know if she can get shingles vaccine even though she has had shingles and is not 49 yrs of age yet?

## 2024-04-10 NOTE — Telephone Encounter (Signed)
 Spoke with pt and advised that Dr. Darrick Huntsman does not recommend the shingles vaccine before the age of 10. Pt had further questions about her having recurring episodes of shingles. I advised pt that she should schedule an appointment to discuss with Dr. Darrick Huntsman. Appt has been scheduled.

## 2024-04-10 NOTE — Telephone Encounter (Signed)
 Copied from CRM [id number]. Topic: Clinical - Medical Advice >> Apr 10, 2024  9:43 AM Drema Balzarine wrote: Reason for CRM: Patient request a call back from Dr. Melina Schools nurse at [phone] regarding a vaccine - she had some questions

## 2024-04-15 ENCOUNTER — Other Ambulatory Visit: Payer: Self-pay

## 2024-04-15 ENCOUNTER — Ambulatory Visit: Admitting: Internal Medicine

## 2024-04-15 ENCOUNTER — Encounter: Payer: Self-pay | Admitting: Internal Medicine

## 2024-04-15 VITALS — BP 114/68 | HR 88 | Ht 62.0 in | Wt 142.4 lb

## 2024-04-15 DIAGNOSIS — B029 Zoster without complications: Secondary | ICD-10-CM

## 2024-04-15 MED ORDER — VALACYCLOVIR HCL 500 MG PO TABS
500.0000 mg | ORAL_TABLET | Freq: Every day | ORAL | 1 refills | Status: AC
  Filled 2024-04-15: qty 90, 90d supply, fill #0

## 2024-04-15 NOTE — Progress Notes (Unsigned)
 Subjective:  Patient ID: Brandi Johnson, female    DOB: 12-07-1986  Age: 38 y.o. MRN: [id number]  CC: There were no encounter diagnoses.   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 Other causes (HSV ) considered but HSV IgG negative.  Started taking valtrex 1000 mg tid l on Marhc 15  stopoped on March 30ast Wednesday  for papular rash that occurred on both sides of face that started on March 13.   The papules become painful then itchy,  develop ithcin ginsdie the nose April 6  , eyelshes ffecgted,  resume valtrex on April 7-8   stopped on the 15th  The rash develops itching ,  along with nose itching,  fatigue dizzy, wth sudden position change  .  Vertigo became worse last Wednesday,  had a moderate headache behind the right eye.  Meclizine did not  help,  took alprazolam  and  rested and felt better   Yesterday stopped taking valtrex   Outpatient Medications Prior to Visit  Medication Sig Dispense Refill   ALPRAZolam (XANAX) 0.25 MG tablet Take 1 tablet (0.25 mg total) by mouth 2 (two) times daily as needed (vertigo). 20 tablet 0   azelastine (ASTELIN) 0.1 % nasal spray Place 2 sprays into both nostrils 2 (two) times daily. 30 mL 11   Azelastine-Fluticasone 137-50 MCG/ACT SUSP Place 1 spray into the nose every 12 (twelve) hours. 23 g 11   cetirizine (ZYRTEC) 10 MG tablet Take 1 tablet (10 mg total) daily by mouth. 90 tablet 4   Cholecalciferol (VITAMIN D3) 1.25 MG (50000 UT) TABS Take by mouth.     levonorgestrel (MIRENA) 20 MCG/24HR IUD 1 each by Intrauterine route once.     Lysine 500 MG CAPS Take 1 capsule by mouth daily.     meclizine (ANTIVERT) 25 MG tablet Take 1 tablet (25 mg total) by mouth daily as needed. 30 tablet 1   montelukast (SINGULAIR) 10 MG tablet Take 1 tablet (10 mg total) by mouth at  bedtime. 90 tablet 1   ondansetron (ZOFRAN-ODT) 4 MG disintegrating tablet Take 1 tablet (4 mg total) by mouth every 8 (eight) hours as needed for nausea or vomiting. 20 tablet 0   tizanidine (ZANAFLEX) 2 MG capsule Take 1 capsule (2 mg total) by mouth at bedtime as needed for muscle spasms 30 capsule 2   valACYclovir (VALTREX) 1000 MG tablet Take 1 tablet (1,000 mg total) by mouth 2 (two) times daily. X 5-7 prn 90 tablet 3   meclizine (ANTIVERT) 25 MG tablet Take 0.5 tablets (12.5 mg total) by mouth 3 (three) times daily as needed for dizziness. 15 tablet 0   No facility-administered medications prior to visit.    Review of Systems;  Patient denies headache, fevers, malaise, unintentional weight loss, skin rash, eye pain, sinus congestion and sinus pain, sore throat, dysphagia,  hemoptysis , cough, dyspnea, wheezing, chest pain, palpitations, orthopnea, edema, abdominal pain, nausea, melena, diarrhea, constipation, flank pain, dysuria, hematuria, urinary  Frequency, nocturia, numbness, tingling, seizures,  Focal weakness, Loss of consciousness,  Tremor, insomnia, depression, anxiety, and suicidal ideation.      Objective:  BP 114/68   Pulse 88   Ht 5\' 2"  (1.575 m)   Wt 142 lb 6.4 oz (64.6 kg)   SpO2 96%   BMI  26.05 kg/m   BP Readings from Last 3 Encounters:  04/15/24 114/68  12/27/23 104/71  12/20/23 98/66    Wt Readings from Last 3 Encounters:  04/15/24 142 lb 6.4 oz (64.6 kg)  12/27/23 143 lb 9.6 oz (65.1 kg)  12/20/23 140 lb 12.8 oz (63.9 kg)    Physical Exam  Lab Results  Component Value Date   HGBA1C 5.1 10/28/2015    Lab Results  Component Value Date   CREATININE 0.68 03/23/2022   CREATININE 0.66 11/18/2018   CREATININE 0.76 11/06/2017    Lab Results  Component Value Date   WBC 4.9 03/23/2022   HGB 13.8 03/23/2022   HCT 40.2 03/23/2022   PLT 183.0 03/23/2022   GLUCOSE 82 03/23/2022   CHOL 179 11/29/2020   TRIG 101 11/29/2020   HDL 51 11/29/2020    LDLCALC 110 (H) 11/29/2020   ALT 14 03/23/2022   AST 21 03/23/2022   NA 140 03/23/2022   K 4.1 03/23/2022   CL 109 03/23/2022   CREATININE 0.68 03/23/2022   BUN 17 03/23/2022   CO2 26 03/23/2022   TSH 1.63 03/23/2022   HGBA1C 5.1 10/28/2015    MR BRAIN W WO CONTRAST Result Date: 12/13/2023 CLINICAL DATA:  Provided history: Chronic sinusitis, unspecified location. Additional history provided by the scanning technologist: sinus infection/inflammation, new onset snoring, headaches. EXAM: MRI HEAD WITHOUT AND WITH CONTRAST TECHNIQUE: Multiplanar, multiecho pulse sequences of the brain and surrounding structures were obtained without and with intravenous contrast. CONTRAST:  6 mL Vueway intravenous contrast. COMPARISON:  None. FINDINGS: Brain: Cerebral volume is normal. Partially empty sella turcica. No cortical encephalomalacia is identified. No significant cerebral white matter disease. There is no acute infarct. No evidence of an intracranial mass. No chronic intracranial blood products. No extra-axial fluid collection. No midline shift. No pathologic intracranial enhancement identified. Vascular: Maintained flow voids within the proximal large arterial vessels. Skull and upper cervical spine: No focal worrisome marrow lesion. Sinuses/Orbits: No mass or acute finding within the imaged orbits. No significant paranasal sinus disease. IMPRESSION: 1. No evidence of an acute intracranial abnormality. 2. Partially empty sella turcica. This finding can reflect incidental anatomic variation, or alternatively, it can be associated with chronic idiopathic intracranial hypertension (pseudotumor cerebri). 3. Otherwise unremarkable MRI appearance the brain. Electronically Signed   By: Bascom Lily D.O.   On: 12/13/2023 11:04    Assessment & Plan:  .There are no diagnoses linked to this encounter.   I spent 34 minutes on the day of this face to face encounter reviewing patient's  most recent visit with  cardiology,  nephrology,  and neurology,  prior relevant surgical and non surgical procedures, recent  labs and imaging studies, counseling on weight management,  reviewing the assessment and plan with patient, and post visit ordering and reviewing of  diagnostics and therapeutics with patient  .   Follow-up: No follow-ups on file.   Thersia Flax, MD

## 2024-04-15 NOTE — Patient Instructions (Signed)
 Resume lysine daily  Consider taking 500 mg valtrex daily as a suooression dose  We will find out where you can to have the viral culture done during an active case

## 2024-04-16 ENCOUNTER — Telehealth: Payer: Self-pay | Admitting: Internal Medicine

## 2024-04-16 NOTE — Assessment & Plan Note (Signed)
 Unclear if her current rash is VZV since shereports bilateral lesions on face.  Will have patient obtain a  sample of fluid to send for PCR and viral culture

## 2024-04-16 NOTE — Telephone Encounter (Signed)
 Patient notified to pick up PCR tubes and she can either drop off at office and/or [facility] lab. Advised patient to refrigerate one tube and the other room temp. Advised patient to use extra labels to label tubes if dropping off in office.

## 2024-04-27 ENCOUNTER — Other Ambulatory Visit: Payer: Self-pay

## 2024-05-06 ENCOUNTER — Other Ambulatory Visit: Payer: Self-pay

## 2024-05-06 ENCOUNTER — Other Ambulatory Visit: Payer: Self-pay | Admitting: Internal Medicine

## 2024-05-06 DIAGNOSIS — B029 Zoster without complications: Secondary | ICD-10-CM

## 2024-08-24 ENCOUNTER — Telehealth: Payer: Self-pay

## 2024-08-24 NOTE — Telephone Encounter (Signed)
 A user error has taken place: encounter opened in error, closed for administrative reasons.

## 2024-09-02 ENCOUNTER — Other Ambulatory Visit: Payer: Self-pay

## 2024-09-03 ENCOUNTER — Other Ambulatory Visit: Payer: Self-pay

## 2024-11-10 ENCOUNTER — Other Ambulatory Visit: Payer: Self-pay

## 2024-12-29 ENCOUNTER — Ambulatory Visit: Admitting: Certified Nurse Midwife

## 2024-12-29 VITALS — BP 118/83 | HR 84 | Ht 61.5 in | Wt 143.5 lb

## 2024-12-29 DIAGNOSIS — Z01419 Encounter for gynecological examination (general) (routine) without abnormal findings: Secondary | ICD-10-CM

## 2024-12-29 DIAGNOSIS — R5383 Other fatigue: Secondary | ICD-10-CM | POA: Diagnosis not present

## 2024-12-29 DIAGNOSIS — Z13220 Encounter for screening for lipoid disorders: Secondary | ICD-10-CM

## 2024-12-29 NOTE — Progress Notes (Signed)
 "        GYNECOLOGY ANNUAL PREVENTATIVE CARE ENCOUNTER NOTE  History:     Brandi Johnson is a 38 y.o. G67P2002 female here for a routine annual gynecologic exam.  Current complaints: fatigue.  She is starting to experience some light bleeding with IUD.  Denies abnormal vaginal bleeding, discharge, pelvic pain, problems with intercourse or other gynecologic concerns.     Social Relationship:married Living:husband and two kids Work:full time, [employer] Exercise:walking daily  Smoke/Alcohol/drug use:no/occasional/never  Gynecologic History Patient's last menstrual period was 12/11/2024 (exact date). Contraception: IUD Last Pap: 12/12/22. Results were: normal with negative HPV Last mammogram: 05/29/23. Results were: normal  Obstetric History OB History  Gravida Para Term Preterm AB Living  2 2 2   2   SAB IAB Ectopic Multiple Live Births      2    # Outcome Date GA Lbr Len/2nd Weight Sex Type Anes PTL Lv  2 Term 03/01/14   7 lb 6.4 oz (3.357 kg) F CS-LTranv Spinal  LIV  1 Term 06/19/11   8 lb 1.6 oz (3.674 kg) M CS-LTranv   LIV    Past Medical History:  Diagnosis Date   Acute bronchitis due to COVID-19 virus 10/17/2020   AR (allergic rhinitis)    Hemorrhoids in pregnancy    History of renal calculi    with hydroneprosis noted during pregnancy   Hydronephrosis    Motion sickness    UTI (lower urinary tract infection)     Past Surgical History:  Procedure Laterality Date   CESAREAN SECTION  2012    Medications Ordered Prior to Encounter[1]  Allergies[2]  Social History:  reports that she has never smoked. She has never used smokeless tobacco. She reports that she does not drink alcohol and does not use drugs.  Family History  Problem Relation Age of Onset   Mental illness Mother    Colitis Mother        collagenous   Colon polyps Mother    Heart disease Father 7       CAD s/p CABG   Diabetes Father    Skin cancer Father    Diabetes Maternal  Grandmother    Hyperlipidemia Maternal Grandmother    Stroke Maternal Grandmother    Stroke Paternal Grandmother    Celiac disease Sister    Cancer Neg Hx     The following portions of the patient's history were reviewed and updated as appropriate: allergies, current medications, past family history, past medical history, past social history, past surgical history and problem list.  Review of Systems Pertinent items noted in HPI and remainder of comprehensive ROS otherwise negative.  Physical Exam:  BP 118/83   Pulse 84   Ht 5' 1.5 (1.562 m)   Wt 143 lb 8 oz (65.1 kg)   LMP 12/11/2024 (Exact Date)   BMI 26.68 kg/m  CONSTITUTIONAL: Well-developed, well-nourished female in no acute distress.  HENT:  Normocephalic, atraumatic, External right and left ear normal. Oropharynx is clear and moist EYES: Conjunctivae and EOM are normal. Pupils are equal, round, and reactive to light. No scleral icterus.  NECK: Normal range of motion, supple, no masses.  Normal thyroid .  SKIN: Skin is warm and dry. No rash noted. Not diaphoretic. No erythema. No pallor. MUSCULOSKELETAL: Normal range of motion. No tenderness.  No cyanosis, clubbing, or edema.  2+ distal pulses. NEUROLOGIC: Alert and oriented to person, place, and time. Normal reflexes, muscle tone coordination.  PSYCHIATRIC: Normal mood and  affect. Normal behavior. Normal judgment and thought content. CARDIOVASCULAR: Normal heart rate noted, regular rhythm RESPIRATORY: Clear to auscultation bilaterally. Effort and breath sounds normal, no problems with respiration noted. BREASTS: Symmetric in size. No masses, tenderness, skin changes, nipple drainage, or lymphadenopathy bilaterally.  ABDOMEN: Soft, no distention noted.  No tenderness, rebound or guarding.  PELVIC: Normal appearing external genitalia and urethral meatus; normal appearing vaginal mucosa and cervix.  No abnormal discharge noted. White discharge no odor.   Pap smear not due.   Normal uterine size, no other palpable masses, no uterine or adnexal tenderness.  .   Assessment and Plan:    Annual Well Women GYN exam .  Pap: not due  Mammogram : due next year Labs: CBC, CMP, Lipid , TSH,Free T4 Vitamin D   Refills: none Referral: none   Reassurance given in regards to bleeding. Discussed that IUD hormone is declining and that this is normal . Discussed replacing early if bleeding becomes bothersome.  Routine preventative health maintenance measures emphasized. Please refer to After Visit Summary for other counseling recommendations.      Zelda Hummer, CNM [facility]  [facility],  [facility]      [1]  Current Outpatient Medications on File Prior to Visit  Medication Sig Dispense Refill   ALPRAZolam  (XANAX ) 0.25 MG tablet Take 1 tablet (0.25 mg total) by mouth 2 (two) times daily as needed (vertigo). 20 tablet 0   azelastine  (ASTELIN ) 0.1 % nasal spray Place 2 sprays into both nostrils 2 (two) times daily. 30 mL 11   Azelastine -Fluticasone  137-50 MCG/ACT SUSP Place 1 spray into the nose every 12 (twelve) hours. 23 g 11   cetirizine  (ZYRTEC ) 10 MG tablet Take 1 tablet (10 mg total) daily by mouth. 90 tablet 4   levonorgestrel  (MIRENA ) 20 MCG/24HR IUD 1 each by Intrauterine route once.     Lysine 500 MG CAPS Take 1 capsule by mouth daily.     meclizine  (ANTIVERT ) 25 MG tablet Take 1 tablet (25 mg total) by mouth daily as needed. 30 tablet 1   montelukast  (SINGULAIR ) 10 MG tablet Take 1 tablet (10 mg total) by mouth at bedtime. 90 tablet 1   ondansetron  (ZOFRAN -ODT) 4 MG disintegrating tablet Take 1 tablet (4 mg total) by mouth every 8 (eight) hours as needed for nausea or vomiting. 20 tablet 0   tizanidine  (ZANAFLEX ) 2 MG capsule Take 1 capsule (2 mg total) by mouth at bedtime as needed for muscle spasms 30 capsule 2   valACYclovir  (VALTREX ) 500 MG tablet Take 1 tablet (500 mg total) by mouth daily. 90 tablet 1   No current  facility-administered medications on file prior to visit.  [2]  Allergies Allergen Reactions   Sulfa Antibiotics Rash   "

## 2024-12-29 NOTE — Patient Instructions (Signed)

## 2024-12-30 LAB — CBC
Hematocrit: 42.2 % (ref 34.0–46.6)
Hemoglobin: 14.4 g/dL (ref 11.1–15.9)
MCH: 30.4 pg (ref 26.6–33.0)
MCHC: 34.1 g/dL (ref 31.5–35.7)
MCV: 89 fL (ref 79–97)
Platelets: 208 x10E3/uL (ref 150–450)
RBC: 4.74 x10E6/uL (ref 3.77–5.28)
RDW: 13.2 % (ref 11.7–15.4)
WBC: 7.3 x10E3/uL (ref 3.4–10.8)

## 2024-12-30 LAB — COMPREHENSIVE METABOLIC PANEL WITH GFR
ALT: 13 IU/L (ref 0–32)
AST: 19 IU/L (ref 0–40)
Albumin: 4.8 g/dL (ref 3.9–4.9)
Alkaline Phosphatase: 51 IU/L (ref 41–116)
BUN/Creatinine Ratio: 21 (ref 9–23)
BUN: 15 mg/dL (ref 6–20)
Bilirubin Total: 0.3 mg/dL (ref 0.0–1.2)
CO2: 21 mmol/L (ref 20–29)
Calcium: 9.9 mg/dL (ref 8.7–10.2)
Chloride: 104 mmol/L (ref 96–106)
Creatinine, Ser: 0.72 mg/dL (ref 0.57–1.00)
Globulin, Total: 2.3 g/dL (ref 1.5–4.5)
Glucose: 75 mg/dL (ref 70–99)
Potassium: 4.5 mmol/L (ref 3.5–5.2)
Sodium: 140 mmol/L (ref 134–144)
Total Protein: 7.1 g/dL (ref 6.0–8.5)
eGFR: 109 mL/min/1.73

## 2024-12-30 LAB — LIPID PANEL
Chol/HDL Ratio: 6.1 ratio — ABNORMAL HIGH (ref 0.0–4.4)
Cholesterol, Total: 238 mg/dL — ABNORMAL HIGH (ref 100–199)
HDL: 39 mg/dL — ABNORMAL LOW
LDL Chol Calc (NIH): 127 mg/dL — ABNORMAL HIGH (ref 0–99)
Triglycerides: 403 mg/dL — ABNORMAL HIGH (ref 0–149)
VLDL Cholesterol Cal: 72 mg/dL — ABNORMAL HIGH (ref 5–40)

## 2024-12-30 LAB — VITAMIN D 25 HYDROXY (VIT D DEFICIENCY, FRACTURES): Vit D, 25-Hydroxy: 36.4 ng/mL (ref 30.0–100.0)

## 2024-12-30 LAB — TSH+FREE T4
Free T4: 0.98 ng/dL (ref 0.82–1.77)
TSH: 2.16 u[IU]/mL (ref 0.450–4.500)

## 2025-01-04 ENCOUNTER — Encounter: Payer: Self-pay | Admitting: Certified Nurse Midwife
